# Patient Record
Sex: Male | Born: 1945
Health system: Southern US, Community
[De-identification: ages and names within clinical notes are randomized; demographics above are authoritative.]

## PROBLEM LIST (undated history)

## (undated) DIAGNOSIS — R7309 Other abnormal glucose: Secondary | ICD-10-CM

## (undated) DIAGNOSIS — J189 Pneumonia, unspecified organism: Secondary | ICD-10-CM

## (undated) DIAGNOSIS — Z8719 Personal history of other diseases of the digestive system: Secondary | ICD-10-CM

## (undated) HISTORY — PX: LAPAROSCOPIC SMALL BOWEL RESECTION: SUR793

## (undated) HISTORY — DX: Other abnormal glucose: R73.09

## (undated) HISTORY — DX: Personal history of other diseases of the digestive system: Z87.19

---

## 2004-01-04 ENCOUNTER — Emergency Department (HOSPITAL_COMMUNITY): Admission: EM | Admit: 2004-01-04 | Discharge: 2004-01-04 | Payer: Self-pay | Admitting: *Deleted

## 2004-01-04 ENCOUNTER — Inpatient Hospital Stay (HOSPITAL_COMMUNITY): Admission: EM | Admit: 2004-01-04 | Discharge: 2004-01-10 | Payer: Self-pay | Admitting: Emergency Medicine

## 2006-01-05 ENCOUNTER — Observation Stay (HOSPITAL_COMMUNITY): Admission: EM | Admit: 2006-01-05 | Discharge: 2006-01-07 | Payer: Self-pay | Admitting: Emergency Medicine

## 2006-03-22 ENCOUNTER — Inpatient Hospital Stay (HOSPITAL_COMMUNITY): Admission: EM | Admit: 2006-03-22 | Discharge: 2006-03-26 | Payer: Self-pay | Admitting: Emergency Medicine

## 2006-04-15 HISTORY — PX: COLONOSCOPY: SHX174

## 2006-06-02 ENCOUNTER — Inpatient Hospital Stay (HOSPITAL_COMMUNITY): Admission: EM | Admit: 2006-06-02 | Discharge: 2006-06-07 | Payer: Self-pay | Admitting: Emergency Medicine

## 2006-06-09 ENCOUNTER — Inpatient Hospital Stay (HOSPITAL_COMMUNITY): Admission: EM | Admit: 2006-06-09 | Discharge: 2006-06-13 | Payer: Self-pay | Admitting: Emergency Medicine

## 2006-08-01 ENCOUNTER — Ambulatory Visit: Payer: Self-pay | Admitting: Internal Medicine

## 2006-09-12 ENCOUNTER — Ambulatory Visit: Payer: Self-pay | Admitting: Gastroenterology

## 2006-09-26 ENCOUNTER — Ambulatory Visit: Payer: Self-pay | Admitting: Gastroenterology

## 2006-10-01 ENCOUNTER — Emergency Department (HOSPITAL_COMMUNITY): Admission: EM | Admit: 2006-10-01 | Discharge: 2006-10-01 | Payer: Self-pay | Admitting: Emergency Medicine

## 2006-10-01 ENCOUNTER — Inpatient Hospital Stay (HOSPITAL_COMMUNITY): Admission: EM | Admit: 2006-10-01 | Discharge: 2006-10-05 | Payer: Self-pay | Admitting: Emergency Medicine

## 2007-01-06 ENCOUNTER — Ambulatory Visit: Payer: Self-pay | Admitting: Internal Medicine

## 2007-01-09 ENCOUNTER — Ambulatory Visit: Payer: Self-pay | Admitting: Internal Medicine

## 2007-01-12 ENCOUNTER — Ambulatory Visit: Payer: Self-pay | Admitting: Internal Medicine

## 2007-01-12 LAB — CONVERTED CEMR LAB
AST: 22 units/L (ref 0–37)
BUN: 15 mg/dL (ref 6–23)
Calcium: 9.2 mg/dL (ref 8.4–10.5)
Chloride: 106 meq/L (ref 96–112)
Cholesterol: 126 mg/dL (ref 0–200)
GFR calc Af Amer: 88 mL/min
GFR calc non Af Amer: 73 mL/min
Glucose, Bld: 95 mg/dL (ref 70–99)
Hgb A1c MFr Bld: 6.2 % — ABNORMAL HIGH (ref 4.6–6.0)
LDL Cholesterol: 72 mg/dL (ref 0–99)
PSA: 0.3 ng/mL (ref 0.10–4.00)
Total CHOL/HDL Ratio: 2.7
VLDL: 7 mg/dL (ref 0–40)

## 2007-02-16 ENCOUNTER — Telehealth (INDEPENDENT_AMBULATORY_CARE_PROVIDER_SITE_OTHER): Payer: Self-pay | Admitting: *Deleted

## 2007-02-19 ENCOUNTER — Encounter: Payer: Self-pay | Admitting: Internal Medicine

## 2007-02-19 DIAGNOSIS — R7309 Other abnormal glucose: Secondary | ICD-10-CM | POA: Insufficient documentation

## 2007-02-19 DIAGNOSIS — Z8719 Personal history of other diseases of the digestive system: Secondary | ICD-10-CM | POA: Insufficient documentation

## 2007-02-20 ENCOUNTER — Ambulatory Visit: Payer: Self-pay | Admitting: Internal Medicine

## 2007-02-20 DIAGNOSIS — L0201 Cutaneous abscess of face: Secondary | ICD-10-CM | POA: Insufficient documentation

## 2007-02-20 DIAGNOSIS — L03211 Cellulitis of face: Secondary | ICD-10-CM

## 2007-02-25 ENCOUNTER — Encounter: Payer: Self-pay | Admitting: Internal Medicine

## 2007-03-06 ENCOUNTER — Ambulatory Visit: Payer: Self-pay | Admitting: Internal Medicine

## 2007-04-22 ENCOUNTER — Ambulatory Visit: Payer: Self-pay | Admitting: Internal Medicine

## 2007-04-22 LAB — CONVERTED CEMR LAB
Calcium: 9.2 mg/dL (ref 8.4–10.5)
Cholesterol: 129 mg/dL (ref 0–200)
GFR calc Af Amer: 88 mL/min
GFR calc non Af Amer: 73 mL/min
Glucose, Bld: 95 mg/dL (ref 70–99)
HDL: 49.4 mg/dL (ref >39.0)
Hgb A1c MFr Bld: 5.7 % (ref 4.6–6.0)
LDL Cholesterol: 73 mg/dL (ref 0–99)
Sodium: 140 meq/L (ref 135–145)
Total CHOL/HDL Ratio: 2.6
VLDL: 6 mg/dL (ref 0–40)

## 2007-05-14 ENCOUNTER — Ambulatory Visit: Payer: Self-pay | Admitting: Internal Medicine

## 2007-05-22 ENCOUNTER — Ambulatory Visit: Payer: Self-pay | Admitting: Internal Medicine

## 2007-05-22 DIAGNOSIS — R221 Localized swelling, mass and lump, neck: Secondary | ICD-10-CM

## 2007-05-22 DIAGNOSIS — R220 Localized swelling, mass and lump, head: Secondary | ICD-10-CM | POA: Insufficient documentation

## 2007-05-25 ENCOUNTER — Encounter: Payer: Self-pay | Admitting: Internal Medicine

## 2007-06-05 ENCOUNTER — Ambulatory Visit (HOSPITAL_BASED_OUTPATIENT_CLINIC_OR_DEPARTMENT_OTHER): Admission: RE | Admit: 2007-06-05 | Discharge: 2007-06-05 | Payer: Self-pay | Admitting: Otolaryngology

## 2007-06-05 ENCOUNTER — Encounter: Payer: Self-pay | Admitting: Internal Medicine

## 2007-06-05 ENCOUNTER — Encounter (INDEPENDENT_AMBULATORY_CARE_PROVIDER_SITE_OTHER): Payer: Self-pay | Admitting: Otolaryngology

## 2007-07-21 ENCOUNTER — Ambulatory Visit: Payer: Self-pay | Admitting: Internal Medicine

## 2007-07-21 DIAGNOSIS — M25519 Pain in unspecified shoulder: Secondary | ICD-10-CM | POA: Insufficient documentation

## 2007-07-22 ENCOUNTER — Telehealth: Payer: Self-pay | Admitting: Internal Medicine

## 2007-08-12 ENCOUNTER — Ambulatory Visit: Payer: Self-pay | Admitting: Internal Medicine

## 2007-08-17 ENCOUNTER — Encounter: Payer: Self-pay | Admitting: Internal Medicine

## 2007-08-19 ENCOUNTER — Encounter: Admission: RE | Admit: 2007-08-19 | Discharge: 2007-09-24 | Payer: Self-pay | Admitting: Specialist

## 2008-01-05 ENCOUNTER — Ambulatory Visit: Payer: Self-pay | Admitting: Internal Medicine

## 2008-04-23 ENCOUNTER — Emergency Department (HOSPITAL_COMMUNITY): Admission: EM | Admit: 2008-04-23 | Discharge: 2008-04-24 | Payer: Self-pay | Admitting: Emergency Medicine

## 2008-04-25 ENCOUNTER — Inpatient Hospital Stay (HOSPITAL_COMMUNITY): Admission: EM | Admit: 2008-04-25 | Discharge: 2008-05-04 | Payer: Self-pay | Admitting: Emergency Medicine

## 2008-04-25 ENCOUNTER — Ambulatory Visit: Payer: Self-pay | Admitting: Internal Medicine

## 2008-04-25 ENCOUNTER — Telehealth: Payer: Self-pay | Admitting: Internal Medicine

## 2008-04-26 ENCOUNTER — Encounter: Payer: Self-pay | Admitting: Internal Medicine

## 2008-04-27 ENCOUNTER — Encounter: Payer: Self-pay | Admitting: Internal Medicine

## 2008-04-28 ENCOUNTER — Encounter: Payer: Self-pay | Admitting: Internal Medicine

## 2008-04-29 ENCOUNTER — Encounter: Payer: Self-pay | Admitting: Internal Medicine

## 2008-04-30 ENCOUNTER — Encounter: Payer: Self-pay | Admitting: Internal Medicine

## 2008-05-01 ENCOUNTER — Encounter: Payer: Self-pay | Admitting: Internal Medicine

## 2008-05-02 ENCOUNTER — Encounter: Payer: Self-pay | Admitting: Internal Medicine

## 2008-05-03 ENCOUNTER — Encounter: Payer: Self-pay | Admitting: Internal Medicine

## 2008-05-31 ENCOUNTER — Ambulatory Visit: Payer: Self-pay | Admitting: Internal Medicine

## 2008-05-31 DIAGNOSIS — R142 Eructation: Secondary | ICD-10-CM

## 2008-05-31 DIAGNOSIS — R143 Flatulence: Secondary | ICD-10-CM

## 2008-05-31 DIAGNOSIS — R141 Gas pain: Secondary | ICD-10-CM | POA: Insufficient documentation

## 2009-03-01 ENCOUNTER — Ambulatory Visit: Payer: Self-pay | Admitting: Internal Medicine

## 2009-03-01 ENCOUNTER — Telehealth (INDEPENDENT_AMBULATORY_CARE_PROVIDER_SITE_OTHER): Payer: Self-pay | Admitting: *Deleted

## 2009-03-01 DIAGNOSIS — R1310 Dysphagia, unspecified: Secondary | ICD-10-CM | POA: Insufficient documentation

## 2009-05-18 ENCOUNTER — Ambulatory Visit (HOSPITAL_BASED_OUTPATIENT_CLINIC_OR_DEPARTMENT_OTHER): Admission: RE | Admit: 2009-05-18 | Discharge: 2009-05-18 | Payer: Self-pay | Admitting: Internal Medicine

## 2009-05-18 ENCOUNTER — Ambulatory Visit: Payer: Self-pay | Admitting: Diagnostic Radiology

## 2009-05-18 ENCOUNTER — Telehealth: Payer: Self-pay | Admitting: Internal Medicine

## 2009-05-18 ENCOUNTER — Ambulatory Visit: Payer: Self-pay | Admitting: Internal Medicine

## 2009-05-18 DIAGNOSIS — R002 Palpitations: Secondary | ICD-10-CM | POA: Insufficient documentation

## 2009-05-18 DIAGNOSIS — R059 Cough, unspecified: Secondary | ICD-10-CM | POA: Insufficient documentation

## 2009-05-18 DIAGNOSIS — R05 Cough: Secondary | ICD-10-CM

## 2009-05-19 ENCOUNTER — Encounter: Payer: Self-pay | Admitting: Internal Medicine

## 2010-01-22 ENCOUNTER — Ambulatory Visit: Payer: Self-pay | Admitting: Internal Medicine

## 2010-01-22 DIAGNOSIS — L259 Unspecified contact dermatitis, unspecified cause: Secondary | ICD-10-CM | POA: Insufficient documentation

## 2010-01-22 DIAGNOSIS — B350 Tinea barbae and tinea capitis: Secondary | ICD-10-CM | POA: Insufficient documentation

## 2010-04-20 ENCOUNTER — Ambulatory Visit
Admission: RE | Admit: 2010-04-20 | Discharge: 2010-04-20 | Payer: Self-pay | Source: Home / Self Care | Attending: Internal Medicine | Admitting: Internal Medicine

## 2010-04-20 DIAGNOSIS — M25569 Pain in unspecified knee: Secondary | ICD-10-CM | POA: Insufficient documentation

## 2010-05-13 LAB — CONVERTED CEMR LAB
Basophils Absolute: 0 10*3/uL (ref 0.0–0.1)
Basophils Relative: 0 % (ref 0–1)
Calcium: 9.4 mg/dL (ref 8.4–10.5)
Cholesterol: 134 mg/dL (ref 0–200)
Creatinine, Ser: 1.05 mg/dL (ref 0.40–1.50)
Eosinophils Absolute: 0.1 10*3/uL (ref 0.0–0.7)
HDL: 53 mg/dL (ref >39)
MCHC: 31.5 g/dL (ref 30.0–36.0)
MCV: 82.7 fL (ref 78.0–100.0)
Neutro Abs: 3.5 10*3/uL (ref 1.7–7.7)
Neutrophils Relative %: 59 % (ref 43–77)
Platelets: 222 10*3/uL (ref 150–400)
TSH: 1.489 microintl units/mL (ref 0.350–4.500)
Triglycerides: 66 mg/dL (ref <150)

## 2010-05-17 NOTE — Assessment & Plan Note (Signed)
Summary: Poison Ivy on feet /hea   Vital Signs:  Patient profile:   65 year old male Height:      73.5 inches Weight:      168.50 pounds BMI:     22.01 O2 Sat:      98 % on Room air Temp:     97.9 degrees F oral Pulse rate:   70 / minute Pulse rhythm:   regular Resp:     18 per minute BP sitting:   106 / 64  (right arm) Cuff size:   large  Vitals Entered By: Glendell Docker CMA (January 22, 2010 3:03 PM)  O2 Flow:  Room air CC: Rash on feet Is Patient Diabetic? No Pain Assessment Patient in pain? no        Primary Care Provider:  Dondra Spry DO  CC:  Rash on feet.  History of Present Illness: 65 y/o AA male c/o rash on both feet.    irritation for the past 7 days.  symptoms started after yard work.  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   Lungs:  normal respiratory effort and normal breath sounds.   Heart:  normal rate, regular rhythm, and no gallop.   Skin:  maculopapular rash over top of both feet.  no vesicles.     Impression & Recommendations:  Problem # 1:  CONTACT DERMATITIS (ICD-692.9)  rash over both feet after yard work.  probable contact  dermatitis.  use steroid crm as directed.  Patient advised to call office if symptoms persist or worsen.  use keflex as directed in case of  secondary cellulitis.    Problem # 2:  TINEA BARBAE (ICD-110.0) Assessment: Deteriorated pt with flare.  use metrogel as directed  Complete Medication List: 1)  Flexi Joint Tabs (Glucosamine-chondroit-msm-c-mn) .... Take 1 tablet by mouth once a day 2)  Cephalexin 500 Mg Caps (Cephalexin) .... One by mouth three times a day 3)  Clotrimazole-betamethasone 1-0.05 % Crea (Clotrimazole-betamethasone) .... Apply two times a day 4)  Metrogel 1 % Gel (Metronidazole) .... Apply two times a day  Patient Instructions: 1)  Call our office if your symptoms do not  improve or gets worse. 2)  Please schedule a follow-up appointment in 1 week Prescriptions: METROGEL 1 % GEL (METRONIDAZOLE) apply two times a day  #1 week x 5   Entered and Authorized by:   D. Thomos Lemons DO   Signed by:   D. Thomos Lemons DO on 01/22/2010   Method used:   Electronically to        [facility] (retail)       [address]  [place], [place]  [address]       Ph: [phone]       Fax: [phone]   RxID:   [id number] CLOTRIMAZOLE-BETAMETHASONE 1-0.05 % CREA (CLOTRIMAZOLE-BETAMETHASONE) apply two times a day  #1 jar x 0   Entered and Authorized by:   D. Thomos Lemons DO   Signed by:   D. Thomos Lemons DO on 01/22/2010   Method used:   Electronically to        [facility] (retail)       [address]       [place], [place]  [address]       Ph: [phone]       Fax: [phone]   RxID:   [id number] CEPHALEXIN 500 MG CAPS (CEPHALEXIN) one by mouth three times a day  #30 x 0   Entered and Authorized by:   D. Thomos Lemons DO   Signed by:   D. Thomos Lemons DO on 01/22/2010   Method used:   Electronically to        [facility] (retail)       [address]       [place], [place]   [address]       Ph: [phone]       Fax: [phone]   RxID:   [id number]    Immunization History:  Influenza Immunization History:    Influenza:  historical (01/09/2010)   Current Allergies (reviewed today): No known allergies

## 2010-05-17 NOTE — Letter (Signed)
   [place] [facility] [address] [place], [place]  [address]  [place] Phone: [phone]      May 19, 2009   Richard Middleton [address] [place], [place] [address]  RE:  LAB RESULTS  Dear  [name],  The following is an interpretation of your most recent lab tests.  Please take note of any instructions provided or changes to medications that have resulted from your lab work.  PSA:  normal - no follow-up needed PSA: 0.29  ELECTROLYTES:  Good - no changes needed  KIDNEY FUNCTION TESTS:  Good - no changes needed  LIPID PANEL:  Good - no changes needed Triglyceride: 66   Cholesterol: 134   LDL: 68   HDL: 53   Chol/HDL%:  2.5 Ratio  THYROID STUDIES:  Thyroid studies normal TSH: 1.489      CBC:  Stable - no changes needed       Sincerely Yours,    Dr. Thomos Lemons

## 2010-05-17 NOTE — Assessment & Plan Note (Signed)
Summary: knee pain/mhf   Vital Signs:  Patient profile:   65 year old male Height:      73.5 inches Weight:      172.75 pounds BMI:     22.56 O2 Sat:      98 % Temp:     97.8 degrees F oral Pulse rate:   64 / minute Resp:     16 per minute BP sitting:   110 / 72  (left arm) Cuff size:   large  Vitals Entered By: Glendell Docker CMA (April 20, 2010 3:31 PM) CC: Knee discomfort Is Patient Diabetic? No Pain Assessment Patient in pain? no     Location: knee Type: sharp Comments c/o of left knee discomfort with deep knee bends, for the past 3 months   Primary Care Provider:  Dondra Spry DO  CC:  Knee discomfort.  History of Present Illness: 65 y/o AA male c/o chronic intermittent knee cap pain with deep knee bends no injury or trauma no swellin or redness   Preventive Screening-Counseling & Management  Alcohol-Tobacco     Smoking Status: quit  Allergies (verified): No Known Drug Allergies  Past History:  Past Medical History: History of recurrent small bowel obstruction History of abnormal glucose       Social History: Smoking Status:  quit  Physical Exam  General:  Well-developed,well-nourished,in no acute distress; alert,appropriate and cooperative throughout examination Lungs:  normal respiratory effort and normal breath sounds.   Heart:  normal rate, regular rhythm, and no gallop.   Msk:  left knee:  normal ROM, no joint tenderness, no joint swelling, no joint warmth, and no joint instability.     Impression & Recommendations:  Problem # 1:  PATELLO-FEMORAL SYNDROME (ICD-719.46) Assessment New pain in left knee with deep knee bends.  PF syndrome vs chonromalacia use NSAIDs sparingly reviewed quadricep strengthening exercises. Patient advised to call office if symptoms persist or worsen.  Complete Medication List: 1)  Flexi Joint Tabs (Glucosamine-chondroit-msm-c-mn) .... Take 1 tablet by mouth once a day 2)  Metrogel 1 % Gel (Metronidazole)  .... Apply two times a day  Patient Instructions: 1)  Call our office if your symptoms do not  improve or gets worse.   Orders Added: 1)  Est. Patient Level III [[address]]    Current Allergies (reviewed today): No known allergies

## 2010-05-17 NOTE — Progress Notes (Signed)
  Phone Note Outgoing Call   Summary of Call: call pt - chest x ray is negative for acute findings Initial call taken by: D. Thomos Lemons DO,  May 18, 2009 6:48 PM  Follow-up for Phone Call        ADVISED PATIENT  Richard Middleton  May 22, 2009 10:03 AM

## 2010-05-17 NOTE — Assessment & Plan Note (Signed)
Summary: cpx- jr   Vital Signs:  Patient profile:   65 year old male Weight:      171 pounds BMI:     22.34 O2 Sat:      98 % on Room air Temp:     97.8 degrees F oral Pulse rate:   62 / minute Pulse rhythm:   regular Resp:     18 per minute BP sitting:   110 / 80  (right arm) Cuff size:   regular  Vitals Entered By: Glendell Docker CMA (May 18, 2009 11:10 AM)  O2 Flow:  Room air  Primary Care Provider:  D. Thomos Lemons DO  CC:  CPX.  History of Present Illness: CPX  65 y/o AA male for routine CPX.  no significant interval hx.  He reports chronic mild tickle of throat triggering non productive cough. 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    Physical Exam  General:  alert, well-developed, and well-nourished.   Head:  normocephalic and atraumatic.   Eyes:  pupils equal, pupils round, and pupils reactive to light.   Ears:  R ear normal and L ear normal.   Mouth:  Oral mucosa and oropharynx without lesions or exudates.  Neck:  supple and no masses.   Lungs:  normal respiratory effort and normal breath sounds.   Heart:  normal rate, regular rhythm, no murmur, and no gallop.   Abdomen:   soft, non-tender, normal bowel sounds, and no masses.   Extremities:  No lower extremity edema  Neurologic:  cranial nerves II-XII intact and gait normal.     Impression & Recommendations:  Problem # 1:  PREVENTIVE HEALTH CARE (ICD-V70.0) Reviewed adult health maintenance protocols.  Colonoscopy: Done (01/06/2007) Td Booster: Tdap (08/01/2006)   Flu Vax: Fluvax Non-MCR (02/01/2009)   Chol: 129 (04/22/2007)   HDL: 49.4 (04/22/2007)   LDL: 73 (04/22/2007)   TG: 31 (04/22/2007) HgbA1C: 5.7 (04/22/2007)   PSA: 0.21 (04/22/2007)  Problem # 2:  COUGH (ICD-786.2) Pt with mild intermittent cough.  I suspect post nasal gtt or gerd.  check cxr.  use nasal saline irrigation.   Patient advised to call office if symptoms persist or worsen.  Other Orders: EKG w/ Interpretation ([address]) T-Basic Metabolic Panel (04540-98119) T-CBC w/Diff [phone]) T-Lipid Profile [phone]) T-TSH [id number]) T-PSA ([address]010-27253) T-2 View CXR, Same Day (71020.5TC)  Patient Instructions: 1)  Please schedule a follow-up appointment in 1 year. 2)  Please schedule a follow-up appointment as needed.      Current Allergies (reviewed today): No known allergies

## 2010-06-19 ENCOUNTER — Other Ambulatory Visit: Payer: Self-pay

## 2010-06-22 ENCOUNTER — Other Ambulatory Visit: Payer: Self-pay | Admitting: Internal Medicine

## 2010-06-22 ENCOUNTER — Encounter (INDEPENDENT_AMBULATORY_CARE_PROVIDER_SITE_OTHER): Payer: Self-pay | Admitting: *Deleted

## 2010-06-22 ENCOUNTER — Other Ambulatory Visit: Payer: Managed Care, Other (non HMO)

## 2010-06-22 DIAGNOSIS — Z0000 Encounter for general adult medical examination without abnormal findings: Secondary | ICD-10-CM

## 2010-06-22 LAB — HEPATIC FUNCTION PANEL
Alkaline Phosphatase: 77 U/L (ref 39–117)
Bilirubin, Direct: 0.1 mg/dL (ref 0.0–0.3)

## 2010-06-22 LAB — BASIC METABOLIC PANEL
BUN: 15 mg/dL (ref 6–23)
CO2: 31 mEq/L (ref 19–32)
Chloride: 103 mEq/L (ref 96–112)
Glucose, Bld: 91 mg/dL (ref 70–99)
Potassium: 4.4 mEq/L (ref 3.5–5.1)

## 2010-06-22 LAB — LIPID PANEL
Cholesterol: 139 mg/dL (ref 0–200)
LDL Cholesterol: 81 mg/dL (ref 0–99)
VLDL: 6.2 mg/dL (ref 0.0–40.0)

## 2010-06-22 LAB — CBC WITH DIFFERENTIAL/PLATELET
Basophils Absolute: 0 10*3/uL (ref 0.0–0.1)
Basophils Relative: 0.2 % (ref 0.0–3.0)
Eosinophils Absolute: 0.1 10*3/uL (ref 0.0–0.7)
Lymphocytes Relative: 30.9 % (ref 12.0–46.0)
MCHC: 33.6 g/dL (ref 30.0–36.0)
Neutrophils Relative %: 62.1 % (ref 43.0–77.0)
RBC: 4.92 Mil/uL (ref 4.22–5.81)

## 2010-06-22 LAB — PSA: PSA: 0.36 ng/mL (ref 0.10–4.00)

## 2010-06-22 LAB — URINALYSIS
Specific Gravity, Urine: 1.02 (ref 1.000–1.030)
Total Protein, Urine: NEGATIVE
Urine Glucose: NEGATIVE

## 2010-06-29 ENCOUNTER — Encounter (INDEPENDENT_AMBULATORY_CARE_PROVIDER_SITE_OTHER): Payer: Managed Care, Other (non HMO) | Admitting: Internal Medicine

## 2010-06-29 ENCOUNTER — Encounter: Payer: Self-pay | Admitting: Internal Medicine

## 2010-06-29 DIAGNOSIS — D179 Benign lipomatous neoplasm, unspecified: Secondary | ICD-10-CM | POA: Insufficient documentation

## 2010-06-29 DIAGNOSIS — Z0000 Encounter for general adult medical examination without abnormal findings: Secondary | ICD-10-CM

## 2010-07-02 ENCOUNTER — Encounter (INDEPENDENT_AMBULATORY_CARE_PROVIDER_SITE_OTHER): Payer: Self-pay | Admitting: *Deleted

## 2010-07-12 NOTE — Letter (Signed)
Summary: Primary Care Consult Scheduled Letter  [place] [facility]  [address]   [place], [place] [address]   Phone: [phone]  Fax: [phone]      07/02/2010 MRN: [id number]  TAWAN DEGROOTE [address] [place], [place]  [address]  [place]    Dear Mr. KNOTH,      We have scheduled an appointment for you.  At the recommendation of Dr.YOO, we have scheduled you a consult with [facility], DR Corliss Skains on [facility] 29,2012 at 10:20AM.  Their address is_[address],[place]  . The office phone number is [phone].  If this appointment day and time is not convenient for you, please feel free to call the office of the doctor you are being referred to at the number listed above and reschedule the appointment.     It is important for you to keep your scheduled appointments. We are here to make sure you are given good patient care.   Thank you, Darral Dash Patient Care Coordinator [place] [facility]

## 2010-07-17 NOTE — Assessment & Plan Note (Signed)
Summary: CPE/MHF   Vital Signs:  Patient profile:   65 year old male Height:      73.5 inches Weight:      172.50 pounds BMI:     22.53 O2 Sat:      99 % on Room air Temp:     97.5 degrees F oral Pulse rate:   60 / minute Resp:     18 per minute BP sitting:   100 / 66  (left arm)  Vitals Entered By: Glendell Docker CMA (June 29, 2010 11:07 AM)  O2 Flow:  Room air CC: CPX Is Patient Diabetic? No Pain Assessment Patient in pain? no        Primary Care Provider:  Dondra Spry DO  CC:  CPX.  History of Present Illness: 65 y/o AA male for routine cpx   right axilla fatty tumor getting bigger  Preventive Screening-Counseling & Management  Alcohol-Tobacco     Alcohol drinks/day: 0     Smoking Status: quit  Caffeine-Diet-Exercise     Caffeine use/day: 1-2  beverages per week     Does Patient Exercise: yes     Times/week: 5  Allergies (verified): No Known Drug Allergies  Past History:  Past Medical History: History of recurrent small bowel obstruction History of abnormal glucose        Past Surgical History: small bowel obstrcution x2, status post resection 2005      Social History: Married with 2 children   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   Head:  normocephalic and atraumatic.   Eyes:  pupils equal, pupils round, and pupils reactive to light.   Ears:  R ear normal and L ear normal.   Mouth:  pharynx pink and moist.   Neck:  supple and no masses.  no carotid bruits.   Lungs:  normal respiratory effort and normal breath sounds.   Heart:  normal rate, regular rhythm, and no gallop.   Abdomen:  soft, non-tender, normal bowel sounds, no masses,  no hepatomegaly, and no splenomegaly.   Extremities:  No lower extremity edema  Neurologic:  cranial nerves II-XII intact and gait normal.   Skin:  4 cm mass right axilla Psych:  normally interactive, good eye contact, not anxious appearing, and not depressed appearing.     Impression & Recommendations:  Problem # 1:  PREVENTIVE HEALTH CARE (ICD-V70.0) Reviewed adult health maintenance protocols.  Orders: EKG w/ Interpretation ([address])  Colonoscopy: Done (01/06/2007) Td Booster: Tdap (08/01/2006)   Flu Vax: Historical (01/09/2010)   Chol: 139 (06/22/2010)   HDL: 51.80 (06/22/2010)   LDL: 81 (06/22/2010)   TG: 31.0 (06/22/2010) TSH: 1.25 (06/22/2010)   HgbA1C: 5.7 (04/22/2007)   PSA: 0.36 (06/22/2010)  Problem # 2:  LIPOMA (ICD-214.9)  pt with probable lipoma right axilla - getting larger  Orders: Surgical Referral (Surgery)  Complete Medication List: 1)  Flexi Joint Tabs (Glucosamine-chondroit-msm-c-mn) .... Take 1 tablet by mouth once a day  Patient Instructions: 1)  Please schedule a follow-up appointment in 1 year.   Orders Added: 1)  EKG w/ Interpretation [93[address]] 2)  Surgical Referral [Surgery] 3)  Est. Patient 40-64 years [[address]]    Current Allergies (reviewed today):  No known allergies

## 2010-07-30 LAB — DIFFERENTIAL
Eosinophils Absolute: 0 10*3/uL (ref 0.0–0.7)
Eosinophils Relative: 0 % (ref 0–5)
Lymphocytes Relative: 17 % (ref 12–46)
Lymphocytes Relative: 9 % — ABNORMAL LOW (ref 12–46)
Lymphs Abs: 0.8 10*3/uL (ref 0.7–4.0)
Lymphs Abs: 0.9 10*3/uL (ref 0.7–4.0)
Monocytes Absolute: 0.2 10*3/uL (ref 0.1–1.0)
Monocytes Relative: 5 % (ref 3–12)
Neutrophils Relative %: 78 % — ABNORMAL HIGH (ref 43–77)

## 2010-07-30 LAB — CBC
HCT: 39.8 % (ref 39.0–52.0)
HCT: 41.4 % (ref 39.0–52.0)
HCT: 45.3 % (ref 39.0–52.0)
Hemoglobin: 13.9 g/dL (ref 13.0–17.0)
Hemoglobin: 14.9 g/dL (ref 13.0–17.0)
MCHC: 33.5 g/dL (ref 30.0–36.0)
MCV: 82.3 fL (ref 78.0–100.0)
MCV: 82.4 fL (ref 78.0–100.0)
MCV: 82.7 fL (ref 78.0–100.0)
MCV: 83 fL (ref 78.0–100.0)
Platelets: 163 10*3/uL (ref 150–400)
Platelets: 163 10*3/uL (ref 150–400)
Platelets: 175 10*3/uL (ref 150–400)
RBC: 4.79 MIL/uL (ref 4.22–5.81)
RBC: 5.39 MIL/uL (ref 4.22–5.81)
RDW: 13.1 % (ref 11.5–15.5)
RDW: 13.6 % (ref 11.5–15.5)
WBC: 5.7 10*3/uL (ref 4.0–10.5)
WBC: 7.2 10*3/uL (ref 4.0–10.5)
WBC: 7.8 10*3/uL (ref 4.0–10.5)
WBC: 8.8 10*3/uL (ref 4.0–10.5)

## 2010-07-30 LAB — HEPATIC FUNCTION PANEL
ALT: 33 U/L (ref 0–53)
Alkaline Phosphatase: 87 U/L (ref 39–117)
Bilirubin, Direct: 0.2 mg/dL (ref 0.0–0.3)
Indirect Bilirubin: 0.9 mg/dL (ref 0.3–0.9)
Total Protein: 8.2 g/dL (ref 6.0–8.3)

## 2010-07-30 LAB — POCT I-STAT, CHEM 8
BUN: 16 mg/dL (ref 6–23)
BUN: 17 mg/dL (ref 6–23)
Calcium, Ion: 1.16 mmol/L (ref 1.12–1.32)
Chloride: 101 mEq/L (ref 96–112)
Creatinine, Ser: 1.2 mg/dL (ref 0.4–1.5)
Glucose, Bld: 117 mg/dL — ABNORMAL HIGH (ref 70–99)
Glucose, Bld: 201 mg/dL — ABNORMAL HIGH (ref 70–99)
Hemoglobin: 16.3 g/dL (ref 13.0–17.0)
Potassium: 3.7 mEq/L (ref 3.5–5.1)
Sodium: 141 mEq/L (ref 135–145)
TCO2: 28 mmol/L (ref 0–100)

## 2010-07-30 LAB — BASIC METABOLIC PANEL
BUN: 15 mg/dL (ref 6–23)
BUN: 17 mg/dL (ref 6–23)
BUN: 19 mg/dL (ref 6–23)
CO2: 26 mEq/L (ref 19–32)
Chloride: 104 mEq/L (ref 96–112)
Chloride: 107 mEq/L (ref 96–112)
Creatinine, Ser: 0.91 mg/dL (ref 0.4–1.5)
Creatinine, Ser: 0.95 mg/dL (ref 0.4–1.5)
GFR calc Af Amer: >60 mL/min (ref >60)
GFR calc non Af Amer: >60 mL/min (ref >60)
GFR calc non Af Amer: >60 mL/min (ref >60)
Glucose, Bld: 106 mg/dL — ABNORMAL HIGH (ref 70–99)
Glucose, Bld: 112 mg/dL — ABNORMAL HIGH (ref 70–99)
Potassium: 3.9 mEq/L (ref 3.5–5.1)
Potassium: 4 mEq/L (ref 3.5–5.1)
Sodium: 140 mEq/L (ref 135–145)

## 2010-08-01 ENCOUNTER — Other Ambulatory Visit: Payer: Self-pay | Admitting: Surgery

## 2010-08-01 ENCOUNTER — Encounter (HOSPITAL_COMMUNITY): Payer: Managed Care, Other (non HMO) | Attending: Surgery

## 2010-08-01 DIAGNOSIS — D1779 Benign lipomatous neoplasm of other sites: Secondary | ICD-10-CM | POA: Insufficient documentation

## 2010-08-01 DIAGNOSIS — Z01812 Encounter for preprocedural laboratory examination: Secondary | ICD-10-CM | POA: Insufficient documentation

## 2010-08-01 LAB — CBC
HCT: 40.7 % (ref 39.0–52.0)
Hemoglobin: 13 g/dL (ref 13.0–17.0)
MCHC: 31.9 g/dL (ref 30.0–36.0)
MCV: 83.7 fL (ref 78.0–100.0)
Platelets: 192 10*3/uL (ref 150–400)
RDW: 13 % (ref 11.5–15.5)
WBC: 5.4 10*3/uL (ref 4.0–10.5)

## 2010-08-01 LAB — SURGICAL PCR SCREEN: Staphylococcus aureus: NEGATIVE

## 2010-08-10 ENCOUNTER — Ambulatory Visit (HOSPITAL_COMMUNITY)
Admission: RE | Admit: 2010-08-10 | Discharge: 2010-08-10 | Disposition: A | Discharge status: Home / Self Care | Payer: Managed Care, Other (non HMO) | Source: Ambulatory Visit | Attending: Surgery | Admitting: Surgery

## 2010-08-10 ENCOUNTER — Other Ambulatory Visit: Payer: Self-pay | Admitting: Surgery

## 2010-08-10 DIAGNOSIS — D1739 Benign lipomatous neoplasm of skin and subcutaneous tissue of other sites: Secondary | ICD-10-CM | POA: Insufficient documentation

## 2010-08-12 NOTE — Op Note (Signed)
  NAME:  Richard Middleton, SMETHURST NO.:  [id number]  MEDICAL RECORD NO.:  [id number]           PATIENT TYPE:  O  LOCATION:  DAYL                         FACILITY:  [facility]  PHYSICIAN:  Wilmon Arms. Corliss Skains, M.D. DATE OF BIRTH:  [birth year]  DATE OF PROCEDURE:  08/10/2010 DATE OF DISCHARGE:                              OPERATIVE REPORT   PREOPERATIVE DIAGNOSIS:  Lipoma, right axilla, 4 cm subcutaneous.  POSTOPERATIVE DIAGNOSIS:  Lipoma, right axilla, 4 cm subcutaneous.  PROCEDURE PERFORMED:  Excision of subcutaneous lipoma, 4 cm subcutaneous.  SURGEON:  Wilmon Arms. Merla Sawka, M.D.  ANESTHESIA:  General.  INDICATIONS:  This is a 65 year old male who presents with a long history of a slowly enlarging mass in his right axilla.  This caused him some mild discomfort.  He presents now for excision.  DESCRIPTION OF PROCEDURE:  The patient was brought to the operating room, placed in the supine position on operating room table.  After anadequate level of general anesthesia was obtained, the patient's right axilla was shaved, prepped with ChloraPrep and draped in sterile fashion.  Time-out was taken to assure proper patient and proper procedure.  We infiltrated the area over the lipoma with 0.25% Marcaine with epinephrine.  A transverse incision was made.  Dissection was carried down through the dermis to the surface of the lipoma.  We dissected completely around the lipoma.  We removed this in its entirely.  The lipoma was densely adherent to the edge of the pectoralis muscle.  The specimen was sent for pathologic examination.  The wound was inspected for hemostasis.  We closed the wound with deep layer of 3- 0 Vicryl and a subcuticular layer of 4-0 Monocryl.  Steri-Strips and clean dressing were applied.  The patient was then extubated and brought to recovery room in stable condition.  All sponge, instrument, and needle counts were correct.     Wilmon Arms. Corliss Skains,  M.D.     MKT/MEDQ  D:  08/10/2010  T:  08/10/2010  Job:  [id number]  Electronically Signed by Manus Rudd M.D. on 08/12/2010 10:09:16 PM

## 2010-08-28 NOTE — H&P (Signed)
NAME:  Richard Middleton, Richard Middleton NO.:  [id number]   MEDICAL RECORD NO.:  [id number]          PATIENT TYPE:  INP   LOCATION:  1333                         FACILITY:  [facility]   PHYSICIAN:  Angelia Mould. Derrell Lolling, M.D.DATE OF BIRTH:  1945/05/29   DATE OF ADMISSION:  10/01/2006  DATE OF DISCHARGE:                              HISTORY & PHYSICAL   CHIEF COMPLAINT:  Abdominal pain and vomiting.   HISTORY OF PRESENT ILLNESS:  This is a 65 year old, black man in good  health.  He has a history of a laparotomy for small bowel obstruction  due to adhesions in 2005.  At that time, it was thought to either be a  congenital or inflammatory band.  Dr. Johna Sheriff performed the surgery,  and the patient recovered uneventfully.   It looks like he was hospitalized in December 2007 with some small bowel  obstruction which resolved after multiple workup showing a long segment  of dilated mid small bowel with stacked-coin appearance but no  obstructing lesion.  He did not require surgery at that time.  He was  also hospitalized for 3 or 4 days in February 2008 by Dr. Carolynne Edouard.  This  resolved with bowel rest, and he became well again.   This episode began yesterday afternoon, September 30, 2006, at 5:30 p.m.  He  had eaten a large meal and had eaten rather rapidly.  He developed right  lower quadrant and lower abdominal crampy pain and came to the Emergency  Room.  He had vomited 2 or 3 times but had vomited only food.  Denied  vomiting any bilious or feces-like material.  He had bowel movements  yesterday.  In fact, he had a semiformed bowel movement at midnight last  night.  He came to Emergency Room yesterday afternoon and was given  citrate of magnesium and sent home early this morning.  After taking the  citrate of magnesium, he had some more crampy pain and vomited and came  back to the Emergency Room about noon today.  The Emergency Room doctor  got a CT scan which suggested a partial small bowel  obstruction with a  transition zone in the right lower quadrant.  His lab work is  unremarkable.   He has currently been pain free and without nausea or vomiting for about  the past 4 or 5 hours and feels fine.  He feels normal.  Denies feeling  distended or full.  I was asked to see him because of his CT scan.   PAST HISTORY:  Laparotomy for small bowel obstruction and lysis of  adhesions in 2005.  Hospitalized in 2007 and again in February 2008 for  a partial small bowel obstruction which resolved without surgery.  He  has no other medical or surgical problems and has been healthy.   CURRENT MEDICATIONS:  Multivitamins.   ALLERGIES:  NONE KNOWN.   SOCIAL HISTORY:  Married, has 2 children.  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  Denies alcohol or  tobacco.   FAMILY HISTORY:  Mother and father  are living and well.  He has a  grandparent with diabetes.   REVIEW OF SYSTEMS:  15 system review of systems is performed and is  noncontributory except as described above.   PHYSICAL EXAMINATION:  GENERAL:  Pleasant, talkative, middle-aged, black  man who is thin and in no distress.  VITAL SIGNS:  Temperature 97.2, pulse 56, respirations 20, blood  pressure 125/82.  EYES:  Sclerae are clear.  Extraocular movements intact.  EARS, NOSE, MOUTH, AND THROAT:  Nose, lips, tongue, and oropharynx  without gross lesions.  NECK:  Supple, nontender.  No mass.  No jugular venous distension.  LUNGS:  Clear to auscultation.  No chest wall tenderness.  HEART:  Regular rate and rhythm.  No murmur.  Radial, femoral, and  posterior tibial pulses are palpable.  No peripheral edema.  ABDOMEN:  Soft, nontender, not distended.  Hypoactive bowel sounds.  Liver and spleen not enlarged.  There is a lower midline scar.  No  hernias are noted in the abdomen or in the inguinal areas.  There is a  little bit of increased tympany to percuss in the upper abdomen.  Absolutely no  tenderness or guarding whatsoever.  EXTREMITIES:  Moves all 4 extremities well without pain or deformity.  NEUROLOGIC:  No gross motor or sensory deficits.   ADMISSION DATA:  Hemoglobin 13.1, white blood cell count 6400.  Complete  metabolic panel normal except for a glucose of 159, lipase of 19.  CT  scan described above.   IMPRESSION:  Recurrent partial small bowel obstruction presumably due to  adhesions.  There is no evidence of compromised bowel at this time.   PLAN:  The patient will be admitted and placed at bowel rest.  We will  hold off on placing an NG tube unless he develops vomiting.  He was  advised that this may resolve spontaneously, but if it does not, then he  may require laparotomy.  All his questions are answered.      Angelia Mould. Derrell Lolling, M.D.  Electronically Signed     HMI/MEDQ  D:  10/01/2006  T:  10/02/2006  Job:  [id number]   cc:   Barbette Hair. [place], DO  [address] [place], [place] [address]

## 2010-08-28 NOTE — Discharge Summary (Signed)
NAME:  Richard Middleton, Richard Middleton NO.:  [id number]   MEDICAL RECORD NO.:  [id number]          PATIENT TYPE:  INP   LOCATION:  1325                         [facility]ACILITY[facility]  [facility]   PHYSICIAN:  Ollen Gross. Vernell Morgans, M.D. DATE OF BIRTH:  1945-07-08   DATE OF ADMISSION:  06/09/2006  DATE OF DISCHARGE:  06/13/2006                               DISCHARGE SUMMARY   HOSPITAL COURSE:  This is a 65 year old gentleman who presented with  nausea and vomiting.  He had abdominal x-rays that were consistent with  a small bowel obstruction although he was having some small bowel  movements.  He was placed on bowel rest and his abdominal films  gradually improved.  As they improved his diet was slowly advanced and  he tolerated this reasonably well and on 02/29 he was tolerating a soft  diet.  His abdomen was not distended.  His films looked good.  He was  ready for discharge home.   MEDICATIONS:  He was to resume his home medications.   DIET:  Soft and liquid diet.   FINAL DIAGNOSIS:  Partial small-bowel obstruction.   FOLLOW UP:  Follow-up with Dr. Carolynne Edouard in a couple of weeks.   CONDITION:  Condition is stable and he is discharged home.      Ollen Gross. Vernell Morgans, M.D.  Electronically Signed     PST/MEDQ  D:  09/11/2006  T:  09/11/2006  Job:  [id number]

## 2010-08-28 NOTE — Op Note (Signed)
NAME:  Richard Middleton, Richard Middleton                  ACCOUNT NO.:  [id number]   MEDICAL RECORD NO.:  [id number]          PATIENT TYPE:  AMB   LOCATION:  DSC                          FACILITY:  [facility]   PHYSICIAN:  Christopher E. Ezzard Standing, M.D.DATE OF BIRTH:  1946-04-01   DATE OF PROCEDURE:  06/05/2007  DATE OF DISCHARGE:                               OPERATIVE REPORT   PREOPERATIVE DIAGNOSIS:  Submental mass, questionable lymph node versus  cyst versus lipoma.   POSTOPERATIVE DIAGNOSIS:  Submental mass, questionable lymph node versus  cyst versus lipoma.   OPERATIONS:  Excision of submental mass.   SURGEON:  Narda Bonds, M.D.   ANESTHESIA:  Local 1% Xylocaine with 100,000 epinephrine.   COMPLICATIONS:  None.   CLINICAL NOTE:  Mr. Dini is a 65 year old gentleman who has had a nodule  in the submental area now for a couple years.  He states this really has  not changed in the last year or two.  It is not tender.  On exam, he has  an approximate 2.5-cm nodule in the submental area consistent with  probable submental lymph node versus a cyst versus lipoma.  He was taken  to operating room at this time for excision of a submental mass.   DESCRIPTION OF PROCEDURE:  The patient was brought to the operating  room.  The submental area was marked out and prepped with Betadine  solution.  The area was then injected with Xylocaine with epinephrine  for local anesthetic.  Incision was made directly over the mass.  The  platysma muscle was divided.  Mass was located deep to the platysmas  muscle consistent with probable lymph node.  The mass was removed.  A 4-  0 chromic suture ligature was used to control bleeding, and cautery was  used control bleeding.  Mass was sent in saline fresh to pathology.  The  defect was closed with 4-0 chromic sutures subcutaneously and 5-0 nylon  to reapproximate skin edges.  Dressing was applied.  The patient  tolerated this well.   DISPOSITION:  The patient was discharged  home later this morning on  Tylenol and Vicodin p.r.n. pain.  I will have him follow up in my office  in 1 week to review pathology and have sutures removed.           ______________________________  Kristine Garbe Ezzard Standing, M.D.     CEN/MEDQ  D:  06/05/2007  T:  06/06/2007  Job:  [id number]   cc:   Barbette Hair. Artist Pais, DO

## 2010-08-28 NOTE — Discharge Summary (Signed)
NAME:  Richard Middleton, Richard Middleton                  ACCOUNT NO.:  [id number]   MEDICAL RECORD NO.:  [id number]          PATIENT TYPE:  INP   LOCATION:  1343                         FACILITY:  [facility]   PHYSICIAN:  Valerie A. Felicity Coyer, MDDATE OF BIRTH:  June 24, 1945   DATE OF ADMISSION:  04/25/2008  DATE OF DISCHARGE:  05/04/2008                               DISCHARGE SUMMARY   DISCHARGE DIAGNOSES:  1. Small bowel obstruction, history of recurrence without clear      etiology, resolved with medical management.  2. History of exploratory laparotomy with question of resection for      small bowel obstruction in 2005.  3. History of mild osteoarthritis.   DISCHARGE MEDICATIONS:  None, as he was taking none prior to admission.   CONSULTATIONS:  [facility].   DIAGNOSTICS:  CT scan of the abdomen/pelvis with contrast April 28, 2008:  Recurrent mid to distal small bowel obstruction without discrete  etiology identified.   PROCEDURE:  None.   HOSPITAL COURSE:  1. Small bowel obstruction.  The patient is a very pleasant, otherwise      healthy 65 year old gentleman with history of small bowel      obstruction recurrent since 2005 with multiple previous      hospitalizations for same.  He had come to the emergency room 2      days prior to admission due to abdominal pain with nausea and      vomiting, and was given a prescription for Vicodin and allowed      discharge home.  However following discharge home, he did not do      well with continued pain, nausea and vomiting, and returned to the      emergency room for further evaluation.  On that day because of      persistent bowel obstructive symptoms with x-ray changes consistent      with bowel obstruction, he was referred for admission to the      medical team.  He was treated with NG suction, IV fluids,      symptomatic medical treatment and bowel rest with serial x-rays.      He was seen in consultation on the day of admission by Dr.  Wenda Low due to history of recurrence with previous exploratory      laparotomy.  Decision was made to follow him clinically which was      done for the first several days as he was making little progress      either clinically or on serial KUBs.  A CT abdomen and pelvis again      was performed on April 28, 2008 as described above, showing no      clear adhesion or mass contributing to small bowel obstructive      symptoms.  Around this time, the patient then began having more      flatus and bowel movements.  So the NG tube was discontinued and he      was allowed on clears for the first 24 hours.  At that  point, he      had a mild setback with increased pain, nausea and vomiting, though      with return to bowel rest and continued IV fluids, this gradually      resolved.  He then began having bowel movements, flatus and      resolution of his pain.  His diet was advanced without      complication.  On the day of discharge, he is tolerating a low-      residue diet without any symptoms of pain, nausea, vomiting and had      a solid bowel movement yesterday without complicating features.  He      is felt at this time medically stable for discharge home having      resolved another episode of his recurrent obstruction without need      for repeat exploratory laparotomy.   He is welcome to follow up with surgery on an as needed basis and with  his primary care physician, Dr. Thomos Lemons, call as needed in the next  few weeks.      Valerie A. Felicity Coyer, MD  Electronically Signed     VAL/MEDQ  D:  05/04/2008  T:  05/04/2008  Job:  [id number]

## 2010-08-31 NOTE — H&P (Signed)
NAME:  Richard Middleton, Richard Middleton NO.:  [id number]   MEDICAL RECORD NO.:  [id number]          PATIENT TYPE:  EMS   LOCATION:  ED                           FACILITY:  [facility]   PHYSICIAN:  Ollen Gross. Vernell Morgans, M.D. DATE OF BIRTH:  1945-11-27   DATE OF ADMISSION:  06/09/2006  DATE OF DISCHARGE:                              HISTORY & PHYSICAL   HISTORY OF PRESENT ILLNESS:  Mr. [name] is a 65 year old black male who  presented the emergency department after experiencing some nausea and  vomiting around midnight.  He was in the hospital about a week ago for a  small bowel obstruction that was managed with bowel rest and IV  hydration, and he had apparent resolution of this but was concerned last  night since his episode of nausea and vomiting.  It sounds as though his  nausea has resolved.  He has not had any more vomiting, and has actually  had a couple of small looser bowel movements.  He is not complaining of  any pain right now.   PAST MEDICAL HISTORY:  Significant for previous bowel obstruction.   PAST SURGICAL HISTORY:  Exploratory laparotomy in 2005 by Dr. Johna Sheriff  for this with lysis of adhesions.   MEDICATIONS:  None.   ALLERGIES:  None.   SOCIAL HISTORY:  Denies smoking or tobacco products.   FAMILY HISTORY:  Noncontributory.   PHYSICAL EXAMINATION:  VITAL SIGNS:  Temperature 97.9, blood pressure  127/76, pulse 65.  GENERAL:  He is a well-developed, well-nourished black male in no acute  distress.  SKIN:  Warm and dry with no jaundice.  HEENT:  Eyes:  Extraocular muscles intact.  Pupils equal, round and  reactive to light.  Sclerae nonicteric.  LUNGS: Clear bilaterally with no use of accessory respiratory muscles.  HEART:  Regular rate and rhythm with impulse in left chest.  ABDOMEN:  Soft and nontender and nondistended.  He has normal bowel  sounds.  No palpable mass or hepatosplenomegaly.  No distension.  EXTREMITIES:  No cyanosis, clubbing or edema with good  strength in his  arms and legs.  PSYCHOLOGICAL:  Alert and oriented x3 with no anxiety or depression.   LABORATORY DATA:  On review of his lab work, it was significant for  normal white count of 6200.  His electrolytes were okay.  On reviewing  his abdominal x-rays, he was noted to have a couple of air fluid levels  consistent with early partial SBO.  potentially.   ASSESSMENT/PLAN:  This is a 65 year old black male who is had a history  of intestinal blockages who presents with some nausea and vomiting after  midnight.  The nausea and vomiting seems to have resolved and his  abdomen  is very benign on exam.  His x-rays do show a couple of air fluid levels  consistent with possibly an early partial small bowel obstruction.  We  will therefore plan to admit him for bowel rest and IV hydration and  monitor him closely.  Will repeat his labs and x-rays in the morning and  go  from there.      Ollen Gross. Vernell Morgans, M.D.  Electronically Signed     PST/MEDQ  D:  06/09/2006  T:  06/09/2006  Job:  [id number]

## 2010-08-31 NOTE — Consult Note (Signed)
NAME:  Richard Middleton, Richard Middleton NO.:  [id number]   MEDICAL RECORD NO.:  [id number]          PATIENT TYPE:  INP   LOCATION:  0101                         FACILITY:  [facility]   PHYSICIAN:  Ardeth Sportsman, MD     DATE OF BIRTH:  07/02/1945   DATE OF CONSULTATION:  06/02/2006  DATE OF DISCHARGE:                                 CONSULTATION   REQUESTING PHYSICIAN:  Dr. Lavera Guise with [facility]   SURGEON:  Karie Soda, MD.   TYPE OF CONSULT:  General surgical.   REASON FOR CONSULT:  Possible small bowel obstruction versus  gastroenteritis.   HISTORY OF PRESENT ILLNESS:  [name] is a 65 year old gentleman who is  otherwise pretty healthy, who apparently developed a bowel obstruction  about 2-3 years ago and underwent urgent surgery with lysis of  adhesions.  Unfortunately, I have no records of this.  He thinks it was  done at [facility].  He thinks it was done by Dr. Zachery Dakins.  He ultimately improved and went home.  He did bounce back to the  hospital within the past year which with what sounds like a partial  small bowel obstruction where he had IV fluids and NG tube and  ultimately opened back up according to surgery.  He has been followed by  Dr. Zachery Dakins regularly and has not had any problems since the last  admission.   Last night after eating some vegetables, he noticed some abdominal pain  and nausea and threw up about 10 times and also had 4 loose bowel  movements, almost diarrhea-like with flatus.  No hematochezia or melena.  He has never had anything like this before.  No other sick contacts.  Denies any fevers, chills, sweats.  Because he had worsening pain, he  came to the emergency room, was evaluated.  Concern was maybe for  partial small bowel obstruction.  He has been admitted by medicine, but  I have been asked to evaluate him.   PAST MEDICAL HISTORY:  1. Small bowel obstruction in the past.  2. Otherwise, he denies any really significant  history.   PAST SURGICAL HISTORY:  Exploratory laparotomy and lysis of adhesions in  2005 by Dr Jaclynn Guarneri   MEDICATIONS:  None.   ALLERGIES:  None.   SOCIAL HISTORY:  He maybe has a 20-pack-year history of tobacco, but he  quit several years ago.  No alcohol, or other drug use.   FAMILY HISTORY:  Noncontributory for any major problems, negative for  any significant gastrointestinal disorders and no problems that he can  recall.   VITAL SIGNS:  Height 6 feet 1 inch, weight 79 kg, temperature 97.6,  respirations 16, heart rate 67, blood pressure 120/62, 100% saturations  on room air.   REVIEW OF SYSTEMS:  Constitutional, psych, neuro, ophthalmology, ENT,  cardiac, respiratory, GU are otherwise negative.  Musculoskeletal,  allergic, dermatologic are otherwise negative.   PHYSICAL EXAMINATION:  GENERAL:  He is a well-developed well-nourished  male in no acute distress.  PSYCH:  He is pleasant, interactive with no  evidence of dementia,  delirium, psychosis, or paranoia.  NEUROLOGIC:  Cranial nerves 2-12 are intact.  Hand grip is 5/5 equal and  symmetrical.  No resting or adjacent tremors.  EYES:  Pupils equal, round, and reactive to light.  Extraocular  movements intact.  He is nonicteric.  Sclerae is nonicteric or injected.  ENT:  Normocephalic with no facial asymmetry.  Mucous membranes are dry.  Oropharynx clear.  NECK:  Supple without any masses.  Trachea is midline.  Thyroid appears  to be normal.  HEART:  Regular rate and rhythm with no murmurs, gallops, or rubs.  No  obvious carotid bruits.  CHEST:  Clear to auscultation bilaterally.  No wheezes, rales, or  rhonchi.  No pain to rib or external compression.  ABDOMEN:  Soft, nontender, nondistended.  He has a low midline incision  with maybe a slight periumbilical hernia but is extremely subtle with no  evidence of incarceration or pain.  But no known incisional hernia.  GENITAL:  Normal external male genitalia with  testes extended  bilaterally.  No inguinal hernias.  RECTAL:  Deferred.  EXTREMITIES:  No cyanosis, clubbing, or edema.  MUSCULOSKELETAL:  Full range of motion of shoulders, elbows, hips,  knees, and ankle.  SKIN:  No obvious petechiae or purpura.  No sores or lesions.  LYMPH:  No head and neck axillary or groin lymphadenopathy.   LABORATORY VALUES:  His white count is 10.5, hemoglobin 13.8.  His  chemistries are unremarkable except for potassium 3.5.  His amylase is  149 which is slightly elevated, but his lipase is normal.  His total  bilirubin was 1.3.  The rest of liver function tests are normal.  Xray  of the abdomen shows a dilated small bowel loop in the center question  of bowel obstruction versus pancreatitis.   ASSESSMENT/PLAN:  A 65 year old male with sudden episode of nausea,  vomiting, and diarrhea and seems to be improving, probable  gastroenteritis versus early partial small bowel obstruction.  1. I agree with admission.  2. IV fluids.  3. NPO.  4. Serial abdominal exams.  5. CT scan of the abdomen and pelvis with oral and IV contrast.  No      surgical intervention at this time.  If he worsens, he gets an NG      tube.  If he worsens more than that, then consider surgery, but I      have a feeling he will improve rapidly with hydration.      Ardeth Sportsman, MD  Electronically Signed     SCG/MEDQ  D:  06/02/2006  T:  06/02/2006  Job:  [id number]

## 2010-08-31 NOTE — H&P (Signed)
NAME:  SHUNSUKE, GRANZOW NO.:  [id number]   MEDICAL RECORD NO.:  [id number]          PATIENT TYPE:  EMS   LOCATION:  ED                           FACILITY:  [facility]   PHYSICIAN:  Lebron Conners, M.D.   DATE OF BIRTH:  [birth year]   DATE OF ADMISSION:  01/05/2006  DATE OF DISCHARGE:                                HISTORY & PHYSICAL   CHIEF COMPLAINT:  Abdominal pain.   HISTORY OF PRESENT ILLNESS:  This is a 65 year old white male with a two day  history of abdominal pain and vomiting.  He has a history of small  intestinal obstruction occurring about two years ago for which he was  operated on by Dr. Johna Sheriff.  He found adhesions and lysed the adhesions  with resolution of the problem.  The patient said this episode reminds him  of that.  He was seen in [facility] today and x-rays were  done suggesting small bowel obstruction.  He felt somewhat better when I saw  him.  White count was normal.  Chemistries were normal.  I felt that perhaps  this might be a self-limited illness rather than obstruction.  However, I  did a CT scan which demonstrates high grade small bowel obstruction in the  left midabdomen with some very definitely decompressed loops distally in the  pelvis.  No masses were seen and no evidence of inflammation was seen.  He  is admitted to the hospital for treatment and observation.   PAST MEDICAL HISTORY:  No chronic GI problems.  He denies heart and lung  problems or other serious chronic ailments.  He is on no medications and has  no medicine allergies.   SOCIAL HISTORY:  He is a nonsmoker.  He drinks alcoholic beverages  occasionally and moderately.   FAMILY HISTORY:  Unremarkable.   CHILDHOOD ILLNESSES:  Unremarkable.   REVIEW OF SYSTEMS:  15-point review of systems is altogether unremarkable.   PHYSICAL EXAMINATION:  GENERAL:  The patient is in no acute distress.  He  was getting up and walking around without problems.  VITAL  SIGNS:  Unremarkable as recorded by the nurse.  HEENT/NECK:  Unremarkable with moist mucous membranes, no icterus, no  masses, no thyroid enlargement.  HEART:  The heart rate and rhythm normal, no murmur or gallops detected.  No  chest wall tenderness.  CHEST:  Clear to auscultation.  ABDOMEN:  Mild distention, soft and nontender.  Hyperactive bowel sounds.  No hernia detected.  RECTAL/PELVIC:  Not done.  EXTREMITIES:  No edema.  Skin intact.  Good pulses.  LYMPH NODES:  Not enlarged in neck, axilla or groin.  NEUROLOGIC:  Grossly normal.   IMPRESSION:  Adhesive small intestinal obstruction.   PLAN:  Nasogastric suction and close follow-up.      Lebron Conners, M.D.  Electronically Signed     WB/MEDQ  D:  01/05/2006  T:  01/07/2006  Job:  [id number]

## 2010-08-31 NOTE — Discharge Summary (Signed)
NAME:  Richard Middleton, Richard Middleton NO.:  [id number]   MEDICAL RECORD NO.:  [id number]          PATIENT TYPE:  INP   LOCATION:  1333                         FACILITY:  [facility]   PHYSICIAN:  Angelia Mould. Derrell Lolling, M.D.DATE OF BIRTH:  22-Oct-1945   DATE OF ADMISSION:  10/01/2006  DATE OF DISCHARGE:  10/05/2006                               DISCHARGE SUMMARY   FINAL DIAGNOSES:  1. Partial small-bowel obstruction, resolved.  2. Remote history of laparotomy for small-bowel obstruction, 2005.   OPERATIONS PERFORMED:  None.   HISTORY:  This is a 65 year old black man with a history of a laparotomy  and lysis of adhesions for small-bowel obstruction in 2005.  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  It appears he was hospitalized in December 2007 with  some small-bowel obstruction which resolved after extensive workup.   The current episode began 24 hours prior to this admission after he had  eaten a large meal and eaten rapidly, and he developed right lower  quadrant and lower abdominal crampy pain and vomiting.  He had a  semiformed bowel movement later that night.  He came to the emergency  room the day of admission with continued crampy pain.  A CT scan was  obtained which suggested a partial small-bowel obstruction.  He had  blood work which was all unremarkable.  I was asked to see him.   At the time that I saw him in the emergency room, he was pain free and  had not had nausea or vomiting for 4 or 5 hours and felt much better.   PHYSICAL EXAMINATION:  GENERAL:  Pleasant middle-aged man in no  distress.  VITAL SIGNS:  Temperature 97.2, pulse 56, blood pressure 125/82.  ABDOMEN:  Soft and nontender.  It did not appear distended.  Bowel  sounds were hypoactive.  The spleen and liver were not enlarged.  There  was a lower midline scar.  No hernias noted.  No masses noted.  Slight  increased temperature to percussion of the  upper abdomen.  No guarding  or tenderness anywhere.   ADMISSION DATA:  White blood cell count 6400.  Lipase 19.   HOSPITAL COURSE:  The patient was admitted for observation.  His x-rays  were more impressive than his clinical appearance.  The following day,  he said he had had a little bit of pain that night but it had completely  resolved.  His lab work was again normal.  His x-rays continued to show  a little bit of dilated small bowel, although it was not clear if this  was a bowel obstruction or ileus.   On October 03, 2006 and October 04, 2006, we did a small-bowel follow-through  which initially looked like a partial small-bowel obstruction, but  eventually the barium went on through into the colon, and the bowel was  decompressed.  He felt well, advanced his diet to a regular diet.  His  bowels began moving, and clinically he was not obstructed.  He  was  discharged on October 05, 2006.  He was asked to follow up in the office in  about 2 weeks.      Angelia Mould. Derrell Lolling, M.D.  Electronically Signed     HMI/MEDQ  D:  11/04/2006  T:  11/04/2006  Job:  [id number]   cc:   Barbette Hair. [place], DO  [address] [place], [place] [address]

## 2010-08-31 NOTE — Consult Note (Signed)
NAME:  Richard Middleton, Richard Middleton NO.:  [id number]   MEDICAL RECORD NO.:  [id number]          PATIENT TYPE:  INP   LOCATION:  1603                         FACILITY:  [facility]   PHYSICIAN:  Anselm Pancoast. Weatherly, M.D.DATE OF BIRTH:  Aug 13, 1945   DATE OF CONSULTATION:  03/22/2006  DATE OF DISCHARGE:                                 CONSULTATION   CHIEF COMPLAINT:  Abdominal pain and distention, nausea and vomiting.   HISTORY:  Richard Middleton is a 65 year old male who has been seen in our  service over the last couple of years with the following history.  Approximately 2 years ago (I think it was at [facility]) he was admitted with  a small bowel obstruction and after 2 days of NG suction, etc. Dr.  Johna Sheriff explored the patient. He had no previous abdominal surgery and  was found to have a transition done in the terminal ileum thought  probably to be related a small adhesion, but with manipulation of the  intestine, the obstruction was released and no other definitive etiology  was identified. He did well for 2 years and then in September was  readmitted for a similar episode by Dr. Orson Slick. This one was treated  with an NG tube. The original x-rays looked like a small bowel  obstruction but with a short period of NG suction the symptoms subsided  and he was released. The present episode of pain started about 12 hours  previously with cramping, bloating, distention and vomiting and he  presented to the emergency room on March 22, 2006 in the early a.m. He  was evaluated by the ER physician. Hospitalist was consulted and  arranged for admission. A CT showed a very dilated distal small bowel  and with kind of a transition zone in the right lower quadrant. The  patient had a moderate amount of stool within his colon and after  arrangements had been made for him to be admitted, I was asked to see  the patient for a surgical consultation. The patient had received  Dilaudid earlier and had an NG  tube in that was not functioning. We  repositioned it and got it to work. He said that he was feeling better  and on discussion with him about the x-rays, etc., He said he would  really prefer not to have any surgery unless it was persistent since he  had been admitted previously as I find from old records. It appeared on  his previous admission by Dr. Orson Slick that no small bowel series or etc.,  had been performed after his symptoms subsided after a short period of  NG suctioning. This is a similar episode but he has not had cramping and  bloating and symptoms in the interim.   ALLERGIES:  The patient has No known allergies.   He is on no chronic medications. He has tried Maalox and Mylanta and  Pepcid. He still has his appendix and all other organs as there had been  no previous surgery prior to Dr. Jamse Mead surgery to cause adhesions.   PHYSICAL EXAMINATION:  GENERAL: At  this time he is resting comfortably.  VITAL SIGNS: He is afebrile.  ABDOMEN: Is a little full but not maximally distended by any means and I  feel no evidence of any umbilical incisional or groin hernias.   His white count is normal. He is afebrile and he certainly has no  localized abdominal tenderness. I am in agreement that the NG suction  and conservative management is certainly preferable and will repeat a  KUB and x-ray in the morning. If he is clinically improved I would  definitely get a small bowel series this hospitalization as this is the  second episode of what appears to be a mechanical obstruction over a  short period of time. If this becomes a reoccurring problem, we will  probably need a repeat laparotomy. The patient still has his appendix  but there was no evidence of any inflammation around the appendix on the  CT that I reviewed. The patient does have a moderate  amount of solid stool in his colon. I think a soapsuds enema would be  helpful on eliminating that but I doubt that the stool in the  colon is  the cause of what appears to be a transition point in the distal ileum  pretty close to the ileocecal valve but certainly on CT there is nothing  that looks like Crohn's disease, etc.           ______________________________  Anselm Pancoast. Zachery Dakins, M.D.     WJW/MEDQ  D:  03/23/2006  T:  03/23/2006  Job:  [id number]

## 2010-08-31 NOTE — Op Note (Signed)
NAME:  Richard Middleton, Richard Middleton NO.:  [id number]   MEDICAL RECORD NO.:  [id number]          PATIENT TYPE:  INP   LOCATION:  5702                         FACILITY:  [facility]   PHYSICIAN:  Sharlet Salina T. Hoxworth, M.D.DATE OF BIRTH:  1945-04-30   DATE OF PROCEDURE:  01/06/2004  DATE OF DISCHARGE:                                 OPERATIVE REPORT   PREOPERATIVE DIAGNOSIS:  Small bowel obstructions.   POSTOPERATIVE DIAGNOSIS:  Small bowel obstructions.   PROCEDURE:  Laparotomy and lysis of adhesions for small bowel obstructions.   SURGEON:  Lorne Skeens. Hoxworth, M.D.   ANESTHESIA:  General.   BRIEF HISTORY:  Richard Middleton is a 65 year old black male with no previous  history of abdominal surgery, who presented to the [facility]  Emergency Room 48 hr ago with persistent severe crampy midabdominal pain,  nausea and vomiting.  Plain X-rays revealed small bowel obstruction.  The  patient was admitted and treated with NG suction and IV fluids.  A follow-up  CT scan yesterday showed persistent small bowel obstruction, with a discrete  area of transition in the mid ileum, with no definite mass, adenopathy or  other intrinsic abnormality of the bowel seen.  Due to persistent  obstruction, laparotomy has been recommended, with some concern over  possible small bowel intrinsic tumor or inflammatory disease (as the patient  has no history of previous surgery).  He has had some symptoms over the past  year of episodic pain and vomiting that would resolve on its own, gradually  worsening.   The nature of the procedure, the indications, risks of bleeding and  infection and the possible need for bowel resection were discussed and  understood.  The patient is now brought to the operating room for this  procedure.   DESCRIPTION OF PROCEDURE:  The patient brought to the operating room and  placed in the supine position on the operating room table.  General  endotracheal anesthesia was  induced.  He received preoperative antibiotics.  PS were in place.  The abdomen was widely sterilely prepped and draped.   A low midline incision skirting the umbilicus was used and dissection  carried down through subcutaneous tissue and midline fascia using cautery.  The peritoneum entered under direct vision.  There was a moderate amount of  slightly hemorrhagic peritoneal fluid.  There was obviously dilated small  bowel.  Ligament of Treitz was identified, which was not particularly  dilated but as the bowel was traced distally it became quite dilated and air-  filled.  The bowel was brought out onto the abdominal wall to trace it, and  as I went distally I encountered a fairly discrete area about 18 inches long  of distended small bowel with hemorrhage in the wall, along the ante  mesenteric border.  Hemorrhage was along the mesentery, as well as might  typically be seen in a closed obstruction secondary to adhesions.  There was  a bruise just at the end of this area, that conceivably could have been  against an adhesion.  There was, however, no band  identified at this point  after eviscerating the bowel.  Beyond this section the bowel abruptly  transitioned to normal decompressed bowel, and was normal all the way to the  ileocecal valve and cecum.  There was a little bit of air in the colon that  was relatively decompressed, and otherwise appeared normal.  The stomach,  duodenum retroperitoneum, liver and gallbladder appeared normal.  I looked  for any possible source of adhesion to the adjacent colon and mesentery  retroperitoneum, but did not see any obvious point.  The bowel, however,  again did have a typical appearance of obstruction secondary to adhesive  band, and I suspect that he may have had congenital or inflammatory adhesion  that was broken up while exploring the bowel.  The bowel was again run from  ligament of Treitz to the ileocecal valve, carefully inspecting; there  was  no intrinsic abnormality to the bowel, other than the reactive distention  and hemorrhage.  There was no adenopathy in the mesentery to suggest  inflammatory bowel disease.  Certainly there was no thickening in the bowel  wall.   At this point the viscera was returned to its anatomic position.  The  abdomen was copiously irrigated with warm saline.  Hemostasis was assured.  The midline fascia was then closed using running #1 PDS, begun at either end  of the incision and tied centrally.  The subcutaneous tissue was irrigated  and the skin closed with staples.   Sponge, needle and instrument counts were correct.  Dry sterile dressings  were applied and the patient taken to the recovery room in good condition.       BTH/MEDQ  D:  01/06/2004  T:  01/07/2004  Job:  [id number]

## 2010-08-31 NOTE — H&P (Signed)
NAME:  Richard Middleton, Richard Middleton NO.:  [id number]   MEDICAL RECORD NO.:  [id number]          PATIENT TYPE:  INP   LOCATION:  1339                         FACILITY:  [facility]   PHYSICIAN:  Lonia Blood, M.D.       DATE OF BIRTH:  12/02/1945   DATE OF ADMISSION:  06/02/2006  DATE OF DISCHARGE:                              HISTORY & PHYSICAL   PRIMARY CARE PHYSICIAN:  The patient does not have a primary care  physician.   HISTORY OF PRESENT ILLNESS:  [name] is a 65 year old gentleman with an  episode small-bowel obstruction and status post resection in 2005.  She  presents to [facility] Emergency Room after an episode of nausea,  vomiting about 10x, and diarrhea about 4x.  The patient reports that he  has had significant abdominal pain today.  He feels much better after he  received a dose of morphine in the emergency room.  The patient does not  have any significant medical history except his abdominal surgery.   MEDICATIONS:  None.   ALLERGIES:  None.   SOCIAL HISTORY:  The patient works as a Surveyor, minerals for [employer].  He is  married.  He has a son that is in the eighth grade.  He does not drink  alcohol and does not smoke cigarettes.  He exercises daily.   FAMILY HISTORY:  Positive for a DVT in his father and positive for  diabetes in his grandmother.   REVIEW OF SYSTEMS:  Negative for chest pain.  Negative for shortness of  breath.  Negative for cough.  Negative fever, chills, focal weakness, or  numbness.  All other systems are negative.   PHYSICAL EXAMINATION:  VITAL SIGNS:  Upon admission shows a temperature  of 97.6, blood pressure 119/78, pulse 73, respirations 18, saturation  100% on room air.  GENERAL APPEARANCE:  A well-developed, well-nourished African-American  gentleman, fit, alert, oriented place, person, and time.  HEENT:  Head is normocephalic, atraumatic.  Eyes: Pupils equal AND round  reactive to light and accommodation.  Conjunctivae are pink.   Sclerae  anicteric.  Throat clear.  NECK:  Supple.  No JVD.  No carotid bruits.  CHEST:  Clear to auscultation bilaterally without wheeze, or crackles.  HEART:  Regular rate and rhythm without murmurs, rubs, or gallop.  ABDOMEN:  The patient's abdomen is soft, nontender, nondistended.  Bowel  sounds are present.  EXTREMITIES:  Lower extremities have no edema.  SKIN:  Warm and dry without any suspicious rashes.   LABORATORY VALUES ON ADMISSION:  White blood cell count 10.5, hemoglobin  13.8, platelet count is 249.  Sodium 136, potassium 3.5, chloride 99,  bicarb 27, BUN 9, creatinine 1, calcium 10.  Albumin 4.1, AST-ALT within  normal limits, amylase 149, and lipase 36.  White blood cell count 8,  hemoglobin 12.6, platelet count is 228.   An abdominal x-ray shows a dilated loop of small bowel in the abdomen.   ASSESSMENT/PLAN:  Nausea, vomiting, and abdominal pain.  The  differential diagnosis includes an acute gastroenteritis versus a  recurrent small-bowel obstruction.  The patient will have a computer  tomography scan of his abdomen to further delineate the diagnosis.  Clinically, now, he does not appear to have a significant high-grade  small-bowel obstruction that will require immediate surgery.  The  patient will be given a clear liquid diet intravenous fluids and the  computer tomography scan of the abdomen and pelvis will be followed  upon.      Lonia Blood, M.D.  Electronically Signed     SL/MEDQ  D:  06/02/2006  T:  06/03/2006  Job:  [id number]   cc:   [facility]

## 2010-08-31 NOTE — Discharge Summary (Signed)
NAME:  Richard Middleton, Richard Middleton NO.:  [id number]   MEDICAL RECORD NO.:  [id number]          PATIENT TYPE:  INP   LOCATION:  1339                         [facility]ACILITY:  [facility]   PHYSICIAN:  Richard Sportsman, MD     DATE OF BIRTH:  [birth year]   DATE OF ADMISSION:  06/02/2006  DATE OF DISCHARGE:  06/07/2006                               DISCHARGE SUMMARY   PRIMARY CARE PHYSICIAN:  Dr. Mikeal Middleton [id number]   SURGEON:  Richard Middleton.   DISCHARGE DIAGNOSES:  1. Partial small bowel obstruction, resolved.  2. 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  3. Status post exploratory laparotomy with lysis of adhesions in 2005      by Dr. Jaclynn Middleton.   SUMMARY OF HOSPITAL COURSE:  Richard Middleton is a pleasant physically active 65-  year-old male, who has struggled with bowel obstructions in the past.  Please see history and physical for further details.  He has been able  to recover from these events nonoperatively back in 2007, but he was  admitted with signs and symptoms concerning for partial small bowel  obstruction on June 02, 2006.  The patient had a CT scan which  showed a high-grade transition zone near the midline of his umbilicus.   He had an NG tube placed and actually started to show marked improvement  with passing of enteral contrast into his colon, bowel movements, and  flatus.  His NG tube dropped off, and it was removed.  The patient  walked numerous, numerous times.  By the time of discharge, he was able  to tolerate a solid, well-chewed diet with no nausea or vomiting.  He  was afebrile with vital signs stable.  His x-rays showed improvement.   Basically he has improved, and it was decided it would reasonable to  discharge him home with the following instructions:  1. He is to follow up with Dr. Mikeal Middleton as a new patient.  Dr. Lavera Middleton with      [facility] had seen the patient and  recommended Dr. Mikeal Middleton to the      patient who wishes at this time to have a primary care physician.      I do not think he has any major health issues at this point, but      this would be preventative and promotion of general health.  2. He should follow up with [facility] in a couple of      weeks to make sure he has stabilized.  The patient has had a      relationship with Dr. Zachery Middleton and wished to continue that.  He      already has an established appointment in mid March.  Perhaps if he      has a recurrent bowel obstruction, he may require exploration with      possible bowel resection if there is this persistent tight area but      since there is no strong  evidence of any lymphadenopathy or cancer      or worsening changes and the patient wished to avoid surgery at      this time, we felt      it was reasonable since he was improved to allow him to recover and      avoid surgery.  3. He should call if he has any worsening fever, chills, sweats,      nausea and vomiting, uncontrolled pain.  4. He is on no medications, and I did not recommend any new      medications for him.      Richard Sportsman, MD  Electronically Signed     SCG/MEDQ  D:  06/07/2006  T:  06/07/2006  Job:  [id number]   cc:   Richard Middleton, M.D.   Anselm Pancoast. Richard Middleton, M.D.  [address], [address]  [place]  [place] [address]

## 2010-08-31 NOTE — Assessment & Plan Note (Signed)
[facility]                           PRIMARY CARE OFFICE NOTE   HOBIE, KOHLES                         MRN:          [id number]  DATE:08/01/2006                            DOB:          03/13/1946    CHIEF COMPLAINT:  New patient in practice.   HISTORY OF PRESENT ILLNESS:  The patient is a 65 year old African-  American male here to establish primary care.  He has not had a primary  care physician in the past.  He was admitted on February of 2008  secondary to small bowel obstruction.  He previously had a bout of small  bowel obstruction and was status post resection in 2005.  There was no  clear etiology.  He presented in February with nausea and vomiting and  diarrhea.  The patient was taken to the OR for exploratory lap.  There  was a question of a mild jejunal stricture.  Patient also underwent  lysis of adhesion at that time.   Otherwise, patient has been quite healthy.  He is very health conscious  and exercises regularly and follows a healthy diet.  There is no history  of hypertension, high cholesterol or diabetes   PAST MEDICAL HISTORY SUMMARY:  Small bowel obstruction x2, status post  resection in 2005.   CURRENT MEDICATIONS:  1. Men's One A Day vitamin a day.  2. Zinc supplement p.r.n.   ALLERGIES TO MEDICATIONS:  None known.   SOCIAL HISTORY:  The patient is married, lives with his wife, works as a  Counselling psychologist.   FAMILY HISTORY:  Mother is age 37, fairly healthy.  Father is age 57,  has a history of DVT.  There is diabetes in his grandmother.  He denies  any family history of colon cancer.   REVIEW OF SYSTEMS:  No HEENT symptoms.  Patient has had irritated skin,  chronic from shaving.  No chest pain, no dyspnea with exertion, no  current GI symptoms.  Denies any dysuria, denies weak stream, no history  or report of erectile dysfunction.  All other systems negative.   PHYSICAL EXAMINATION:  VITAL SIGNS:   Weight is 174 pounds, temperature  is 96.8, pulse is 63, BP is 120/74 in the left arm in a seated position.  IN GENERAL:  The patient is a pleasant, well-developed, well-nourished,  65 year old African-American male, in no apparent distress.  HEENT:  Normocephalic, atraumatic.  Pupils are equal and reactive to  light bilaterally.  Extraocular motility was intact.  Patient was  anicteric.  Conjunctivae were within normal limits.  External auditory  canals and tympanic membranes were clear bilaterally.  Oropharyngeal  exam was unremarkable.  Examination of his skin reveals increased pigmentation around his neck  and lower mandible.  Otherwise, neck exam was normal.  No thyromegaly,  no carotid bruit.  CHEST EXAM:  Normal inspiratory efforts.  Chest was clear to  auscultation bilaterally.  No rhonchi, rales, or wheezing.  CARDIOVASCULAR:  Regular rate and rhythm, no significant murmurs, rubs,  or gallops appreciated.  ABDOMEN:  Soft, nontender, positive bowel sounds, no  organomegaly.  DIGITAL RECTAL EXAM:  Deferred today.  NEUROLOGIC:  Cranial nerves II-XII are grossly intact.  He was nonfocal.  He had intact dorsalis pedis pulses, equal and symmetrical.   E Chart review:  Normal kidney function, liver function tests, but  elevated blood sugar readings during hospitalization.   IMPRESSION:  1. History of small bowel obstruction x2, status post resection in      2005.  2. Abnormal glucose.  3. Health maintenance.   RECOMMENDATIONS:  He will have followup labs including A1c and repeat  fasting CBG.  Recommended also screening for prostate cancer.  We  deferred digital rectal exam for followup visit.   I recommended screening colonoscopy.  He was updated with tetanus and  pertussis vaccine.   Followup is in June of 2008.     Barbette Hair. Artist Pais, DO  Electronically Signed    RDY/MedQ  DD: 08/01/2006  DT: 08/01/2006  Job #: [id number]

## 2010-08-31 NOTE — Discharge Summary (Signed)
NAME:  Richard Middleton, Richard Middleton NO.:  [id number]   MEDICAL RECORD NO.:  [id number]          PATIENT TYPE:  INP   LOCATION:  5704                         FACILITY:  [facility]   PHYSICIAN:  Sharlet Salina T. Hoxworth, M.D.DATE OF BIRTH:  1946/02/24   DATE OF ADMISSION:  01/04/2004  DATE OF DISCHARGE:  01/10/2004                                 DISCHARGE SUMMARY   DISCHARGE DIAGNOSES:  Small bowel obstruction.   OPERATION/PROCEDURE:  Laparotomy and lysis of adhesions for small bowel  obstruction on January 06, 2004.   HISTORY OF PRESENT ILLNESS:  Richard Middleton is a previously healthy 65 year old  male who presents with an 18-hour history of abdominal pain, nausea, and  vomiting.  This was diffuse cramping pain and vomiting of undigested food.  He and his wife state that for about the past year or two he has had  intermittent similar episodes somewhat milder and more short-lived for which  he did not seek medical attention.  He has no previous abdominal surgery or  a history of GI problems.   PAST MEDICAL HISTORY:  No abdominal or any other surgery.  No  hospitalizations or serious illness.   MEDICATIONS:  None.   ALLERGIES:  None.   SOCIAL HISTORY:  See detailed  H&P.   FAMILY HISTORY:  See detailed H&P.   REVIEW OF SYSTEMS:  See detailed H&P.   PHYSICAL EXAMINATION:  VITAL SIGNS:  He is afebrile.  Vital signs all within  normal limits.  GENERAL:  Fit-appearing black male in no acute distress.  ABDOMEN:  Mild distention, hypoactive bowel sounds.  There is moderate to  diffuse tenderness.  No palpable masses or hepatosplenomegaly.   LABORATORIES:  White count 10.3.  Amylase normal.  Generally unremarkable.  Flat and upright abdominal x-rays show moderately dilated air filled loops  of small bowel consistent with partial early small bowel obstruction.   HOSPITAL COURSE:  Patient was admitted with an impression of early small  bowel obstruction.  NG tube was placed with some  improvement in his  distention.  CT scan of the abdomen was obtained which revealed high grade  small bowel obstruction with a transition point in the mid ileum, questioned  thickened loop of abnormal bowel in this area.  Due to findings on CT and  particularly with history of progressive worsening episodes, we elected to  proceed with exploration.  This was performed on September 23.  Operative  findings were felt to be secondary to an adhesion with bowel obstruction and  the adhesion was broken up during the procedure without definitely  identifying the adhesion itself, although there was a clear transition point  in the mid small bowel, but no intrinsic small bowel defect.  Please see  operative report for details.  His postoperative course was smooth.  NG tube  was removed on the second postoperative day and he was started on clear  liquids.  He had flatus by the third postoperative day and diet was  advanced.  By the fourth postoperative day he was tolerating a full liquid  diet and had  several loose stools.  Abdomen was soft, nontender.  Wound was  healing well.  He was discharged home at this time.   DISCHARGE MEDICATIONS:  Tylox for pain.   FOLLOWUP:  In my office in one to two weeks.      Benj   BTH/MEDQ  D:  04/02/2004  T:  04/03/2004  Job:  [id number]

## 2010-08-31 NOTE — H&P (Signed)
NAME:  Richard Middleton, Richard Middleton NO.:  [id number]   MEDICAL RECORD NO.:  [id number]          PATIENT TYPE:  INP   LOCATION:  1827                         FACILITY:  [facility]   PHYSICIAN:  Sharlet Salina T. Hoxworth, M.D.DATE OF BIRTH:  10/20/1945   DATE OF ADMISSION:  01/04/2004  DATE OF DISCHARGE:                                HISTORY & PHYSICAL   CHIEF COMPLAINT:  Abdominal pain, nausea and vomiting.   HISTORY OF PRESENT ILLNESS:  Richard Middleton is a generally healthy 65 year old  black male who presents to the [facility] Emergency Room with an  approximately 18 hour history of abdominal pain, nausea and vomiting.  He  initially had the onset of abdominal pain late last night which he describes  as mid abdominal pain.  It became quite severe.  He describes a somewhat  constant pain with cramp like exacerbation spreading out across his abdomen.  This was followed soon after by nausea and vomiting of undigested food.  No  hematemesis or bilious vomiting.  He did have a relatively normal bowel  movement this morning and previously had been having normal bowel movements  without constipation, diarrhea, or blood.  He has had no fever or chills.  He and his wife state that for approximately one to two years he has had  occasional episodes of very similar symptoms, although they have not been as  severe and have been self limited, and he did not seek medical attention.  Between these episodes which previously have just lasted a few hours, he  feels well without any indigestion, chronic abdominal discomfort, lack of  appetite, weight change or any other abdominal or GI symptoms chronically.  He has had no previous abdominal surgery.   PAST MEDICAL HISTORY:  1.  No abdominal or any other surgery.  2.  No hospitalizations or serious illness.   MEDICATIONS:  None.   ALLERGIES:  None.   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  He is married.  He quit  smoking in his 67s.  Rare alcohol use.   FAMILY HISTORY:  Parents are both alive without any serious illness.   REVIEW OF SYSTEMS:  GENERAL:  No fever, chills, weight change, malaise.  HEENT:  No vision, hearing, swallowing problems.  RESPIRATORY:  No shortness  of breath, cough, wheezing.  CARDIAC:  No chest pain, palpitations,  swelling, or history of heart disease.  ABDOMEN:  GI as above.  GU:  No  urinary burning, frequency, hesitancy.  MUSCULOSKELETAL:  No joint pain,  swelling.  NEUROLOGIC:  No numbness, weakness, syncope.   PHYSICAL EXAMINATION:  VITAL SIGNS:  Temperature is 97.5, pulse 63, blood  pressure 110/64, respirations 22.  GENERAL:  A fit-appearing black male in no acute distress.  SKIN:  Warm and dry without rash or infection.  HEENT:  No palpable masses or thyromegaly.  Sclerae nonicteric.  Nares and  oropharynx clear.  LYMPH NODES:  No cervical, supraclavicular, or inguinal nodes palpable.  LUNGS:  Clear to auscultation without increased work of breathing.  CARDIAC:  Regular rate and rhythm without murmurs and no JVD or edema.  ABDOMEN:  Minimal distention.  Bowel sounds are hypoactive.  There is mild  to moderate diffuse tenderness but the abdomen is soft without guarding.  No  palpable masses or hepatosplenomegaly.  GENITALIA:  Normal male.  RECTAL:  No tenderness.  No masses.  No stool.  Prostate normal.  EXTREMITIES:  No joint swelling, deformity.  NEUROLOGIC:  He is alert, fully oriented.  Motor and sensory exams are  grossly normal.   LABORATORY:  Amylase 107.  White count 10.3, hemoglobin 12.3.  Electrolytes,  LFTs, urinalysis all normal.   Flat and upright abdominal x-ray shows some moderately dilated air filled  loops of small bowel consistent with partial or early small-bowel  obstruction.   ASSESSMENT/PLAN:  Clinical picture and x-rays indicating small-bowel  obstruction.  He has not had previous abdominal surgery.  Differential would  include  inflammatory or congenital adhesions, inflammatory bowel disease,  benign or malignant neoplasms, or internal hernia.  The patient will be  admitted for nasogastric suction, intravenous fluids, and symptom control.  We will initially obtain a CT scan of the abdomen to try to determine the  cause of the obstruction.  He does not seem to have indication at the  present for emergency laparotomy.       BTH/MEDQ  D:  01/04/2004  T:  01/05/2004  Job:  [id number]

## 2010-08-31 NOTE — H&P (Signed)
NAME:  Richard Middleton, Richard Middleton NO.:  [id number]   MEDICAL RECORD NO.:  [id number]          PATIENT TYPE:  INP   LOCATION:  1603                         FACILITY:  [facility]   PHYSICIAN:  Theresia Bough, MD       DATE OF BIRTH:  1945-11-08   DATE OF ADMISSION:  03/22/2006  DATE OF DISCHARGE:                              HISTORY & PHYSICAL   PRESENTING COMPLAINT:  Abdominal pain and vomiting.   HISTORY OF PRESENT ILLNESS:  This is a 65 year old African-American male  patient who got sick yesterday.  He developed abdominal pain.  He  developed nausea and vomiting.  Patient has not been able to retain any  meals since 7:00 p.m. last night.  The patient moved his bowels this  morning.  He has also been passing gas.  Abdominal pain is about 6/10.  It was relieved by IV Dilaudid.  He has vomited about three times this  morning.  No chest pain.  No shortness of breath.  No headaches.  No  dizziness.  No dysuria.  No joint pain.  No feet swelling.   PAST MEDICAL HISTORY:  History of bowel obstruction.  He had abdominal  surgery three years ago because of bowel obstruction.  The patient was  seen in September, 2007 because of abdominal pain that was managed  conservatively.   SOCIAL HISTORY:  Patient denies cigarette smoking.  Denies alcohol.  No  history of drug use.   MEDICATIONS:  Patient was not on any home medications.   FAMILY HISTORY:  Noncontributory.   ALLERGIES:  No known drug allergies.   PHYSICAL EXAMINATION:  VITAL SIGNS:  Blood pressure 112/78, pulse rate  78, respiration 16, temperature 98.1.  HEAD/NECK:  Pink conjunctivae.  There is no jaundice.  His mucous  membranes looks dry.  He has an NG tube in place.  CHEST:  Symmetrical respirations.  On auscultation, clear breath sounds.  CARDIOVASCULAR:  Normal heart sounds.  No murmur or gallop.  ABDOMEN:  Soft.  Very mild tenderness.  There is no rebound tenderness.  No masses palpable.  EXTREMITIES:  No edema.   There is no cyanosis.  CENTRAL NERVOUS SYSTEM:  Patient is alert and oriented to time, place,  and person.  Speech is clear.  Power is 4/4 in all limbs.   Labs show sodium 135, potassium 3.7, chloride 100, bicarb 26, BUN 15,  creatinine 1.1, glucose 167.  WBC 8.1, hemoglobin 14.7, hematocrit 43.9,  platelets 239.   CT of the abdomen and pelvis with oral contrast is pending.   ASSESSMENT:  Query intestinal obstruction, also rule out inflammation.   The plan is for patient to have a CT of the abdomen and pelvis.  NG tube  to suction any GI secretions.  Continue IV fluids on normal saline with  potassium at 100 cc/hr.  Patient __________ Reglan or Zofran p.r.n. for  nausea and vomiting.  Dilaudid 0.5 to 1 mg IV q.4h. p.r.n. severe pain.  I will also consult surgery to see patient.  Patient is to be admitted  to med/surg.  Theresia Bough, MD  Electronically Signed     GA/MEDQ  D:  03/22/2006  T:  03/22/2006  Job:  [id number]

## 2010-08-31 NOTE — Discharge Summary (Signed)
NAME:  Richard Middleton, Richard Middleton                  ACCOUNT NO.:  [id number]   MEDICAL RECORD NO.:  [id number]          PATIENT TYPE:  INP   LOCATION:  1603                         FACILITY:  [facility]   PHYSICIAN:  Mobolaji B. Bakare, M.D.DATE OF BIRTH:  05/04/1945   DATE OF ADMISSION:  03/22/2006  DATE OF DISCHARGE:  03/26/2006                               DISCHARGE SUMMARY   PRIMARY CARE PHYSICIAN:  Patient is unassigned.  Patient will follow up  with Dr. Lonia Blood in the outpatient setting upon discharge.   FINAL DIAGNOSIS:  Small bowel obstruction.   CONSULTS:  Surgical consult, Dr. Zachery Dakins.   PROCEDURES:  1. Small bowel series done on March 25, 2006.  It showed a long      segment of dilated and mid small bowel with a stacked coin      appearance.  No visible obstructing lesion.  Findings most      consistent with an obstructed loop that is resolving when compared      to CT scan done on March 22, 2006.  2. CT scan of the abdomen done on March 22, 2006 showed dilated      small bowel loops consistent with small bowel obstruction.  No      obvious cause noticed.  Small hepatic cysts.  On pelvic      examination, there is small bowel obstruction with transition to      nondilated decompressed small bowel loops in the pelvis, likely due      to adhesions in the proximal mid ileum in the midline just above      the iliac crest.  No pelvic masses or adenopathy.  3. Follow-up chest x-ray done on December 10th showed no change in the      partial small bowel obstruction pattern.   HISTORY:  Richard Middleton is a 65 year old African-American male with no  known past medical history except for laparotomy for small bowel  obstruction in 2005.  In September, 2005, he presented with small bowel  obstruction.  He never had any surgical procedure done prior to this.  He underwent laparotomy, and the findings were small bowel obstruction  with a discrete area of transition in the mid ileum without  any definite  mass or adenopathy.  He presented again in September, 2007 with small  bowel obstruction, and this resolved without any surgical intervention.   Patient again presented on the 8th of December with abdominal pain and  vomiting.  CT scan and abdominal x-ray was consistent with small bowel  obstruction.  He was admitted for further treatment.  He had no fever or  leukocytosis.   HOSPITAL COURSE:  Richard Middleton was placed on NG tube, n.p.o., and he  received IV Dilaudid for pain management.  He was seen by Dr. Zachery Dakins,  and he recommended small bowel series.  Report of small bowel series is  as noted above.  There is no intrinsic or extrinsic etiology.  This  bowel obstruction is likely secondary to bowel adhesions.   Patient was on NG tube for about two days.  He was subsequently started  on clear liquids, and his diet has been advanced to full-liquid diet.  He is tolerating a full-liquid diet.  He has been able to move his  bowels, and there is no abdominal distention or pain.  He will be tried  on a regular diet.  If he tolerates this, patient will be discharged  home and to follow up with a PCP.   DISCHARGE LABORATORY DATA:  Sodium 159, potassium 3.9, chloride 105,  bicarb 28, BUN 7, creatinine 1.1, glucose 89, calcium 9.  White cells  4.4, hemoglobin 13.1, hematocrit 38.5, platelets 223.   Follow up with Dr. Lonia Blood in 1-2 weeks.      Mobolaji B. Corky Downs, M.D.  Electronically Signed     MBB/MEDQ  D:  03/26/2006  T:  03/26/2006  Job:  [id number]

## 2011-01-30 LAB — COMPREHENSIVE METABOLIC PANEL
ALT: 23
AST: 28
Calcium: 9.5
GFR calc Af Amer: >60
Sodium: 136
Total Protein: 7.1

## 2011-01-30 LAB — URINALYSIS, ROUTINE W REFLEX MICROSCOPIC
Bilirubin Urine: NEGATIVE
Glucose, UA: NEGATIVE
Glucose, UA: NEGATIVE
Ketones, ur: 15 — AB
Ketones, ur: 40 — AB
Nitrite: NEGATIVE
Specific Gravity, Urine: 1.02
Specific Gravity, Urine: >1.046 — ABNORMAL HIGH
pH: 7
pH: 8

## 2011-01-30 LAB — AMYLASE: Amylase: 156 — ABNORMAL HIGH

## 2011-01-30 LAB — CBC
HCT: 38.2 — ABNORMAL LOW
HCT: 41.6
Hemoglobin: 12.7 — ABNORMAL LOW
Hemoglobin: 13.9
MCHC: 33.2
MCHC: 33.3
MCV: 80.8
Platelets: 185
Platelets: 204
RBC: 4.73
RBC: 5.12
RDW: 12.9
RDW: 13.4
RDW: 13.6
WBC: 6.9
WBC: 9.4

## 2011-01-30 LAB — BASIC METABOLIC PANEL
BUN: 7
BUN: 8
CO2: 27
Calcium: 8.6
Calcium: 9.1
Chloride: 104
Chloride: 107
Creatinine, Ser: 1.01
GFR calc Af Amer: >60
GFR calc Af Amer: >60
GFR calc non Af Amer: >60
GFR calc non Af Amer: >60
GFR calc non Af Amer: >60
Glucose, Bld: 123 — ABNORMAL HIGH
Potassium: 3.8
Potassium: 4.1
Potassium: 4.6
Sodium: 138
Sodium: 139
Sodium: 141

## 2011-01-30 LAB — DIFFERENTIAL
Eosinophils Absolute: 0
Eosinophils Relative: 0
Lymphocytes Relative: 8 — ABNORMAL LOW
Lymphs Abs: 0.5 — ABNORMAL LOW
Monocytes Relative: 2 — ABNORMAL LOW

## 2012-01-10 ENCOUNTER — Other Ambulatory Visit (INDEPENDENT_AMBULATORY_CARE_PROVIDER_SITE_OTHER): Payer: Managed Care, Other (non HMO)

## 2012-01-10 DIAGNOSIS — Z0000 Encounter for general adult medical examination without abnormal findings: Secondary | ICD-10-CM

## 2012-01-10 LAB — POCT URINALYSIS DIPSTICK
Leukocytes, UA: NEGATIVE
Protein, UA: NEGATIVE
Urobilinogen, UA: 0.2

## 2012-01-10 LAB — CBC WITH DIFFERENTIAL/PLATELET
Basophils Absolute: 0 10*3/uL (ref 0.0–0.1)
Eosinophils Absolute: 0.1 10*3/uL (ref 0.0–0.7)
HCT: 42.8 % (ref 39.0–52.0)
Lymphs Abs: 1.4 10*3/uL (ref 0.7–4.0)
MCV: 84.1 fl (ref 78.0–100.0)
Monocytes Absolute: 0.3 10*3/uL (ref 0.1–1.0)
Neutrophils Relative %: 67.8 % (ref 43.0–77.0)
Platelets: 186 10*3/uL (ref 150.0–400.0)
RDW: 14.2 % (ref 11.5–14.6)

## 2012-01-10 LAB — BASIC METABOLIC PANEL
BUN: 15 mg/dL (ref 6–23)
Chloride: 104 mEq/L (ref 96–112)
Creatinine, Ser: 1.1 mg/dL (ref 0.4–1.5)
GFR: 88.15 mL/min (ref >60.00)
Glucose, Bld: 88 mg/dL (ref 70–99)
Potassium: 4.6 mEq/L (ref 3.5–5.1)

## 2012-01-10 LAB — LIPID PANEL
LDL Cholesterol: 71 mg/dL (ref 0–99)
VLDL: 6.8 mg/dL (ref 0.0–40.0)

## 2012-01-10 LAB — PSA: PSA: 0.25 ng/mL (ref 0.10–4.00)

## 2012-01-10 LAB — HEPATIC FUNCTION PANEL
Albumin: 3.9 g/dL (ref 3.5–5.2)
Total Protein: 7 g/dL (ref 6.0–8.3)

## 2012-01-24 ENCOUNTER — Ambulatory Visit (INDEPENDENT_AMBULATORY_CARE_PROVIDER_SITE_OTHER): Payer: Managed Care, Other (non HMO) | Admitting: Internal Medicine

## 2012-01-24 ENCOUNTER — Encounter: Payer: Self-pay | Admitting: Internal Medicine

## 2012-01-24 VITALS — BP 116/74 | HR 68 | Temp 98.4°F | Ht 72.0 in | Wt 169.0 lb

## 2012-01-24 DIAGNOSIS — Z23 Encounter for immunization: Secondary | ICD-10-CM

## 2012-01-24 DIAGNOSIS — Z0000 Encounter for general adult medical examination without abnormal findings: Secondary | ICD-10-CM | POA: Insufficient documentation

## 2012-01-24 NOTE — Assessment & Plan Note (Signed)
Reviewed adult health maintenance protocols.  Patient updated with influenza vaccine and Pneumovax. He is up-to-date with colonoscopy. Digital rectal exam was normal. PSA is less than 1. Cholesterol panel is excellent. Continue healthy life style.

## 2012-01-24 NOTE — Progress Notes (Signed)
  Subjective:    Patient ID: Richard Middleton, male    DOB: 1945/11/08, 66 y.o.   MRN: [id number]  HPI  66 year old American male for routine physical. Patient does not have any chronic illnesses. Patient reports he remains healthy and active. No interval hospitalizations or illnesses.  Screening labs reviewed in detail. His cholesterol panel is well-controlled.  Review of Systems   Constitutional: Negative for activity change, appetite change and unexpected weight change.  Eyes: Negative for visual disturbance.  Respiratory: Negative for cough, chest tightness and shortness of breath.   Cardiovascular: Negative for chest pain.  Genitourinary: Negative for difficulty urinating.  Neurological: Negative for headaches.  Gastrointestinal: Negative for abdominal pain, heartburn melena or hematochezia Psych: Negative for depression or anxiety  Past Medical History  Diagnosis Date  . History of small bowel obstruction     x2  . Abnormal glucose     History   Social History  . Marital Status: Married    Spouse Name: N/A    Number of Children: N/A  . Years of Education: N/A   Occupational History  . Not on file.   Social History Main Topics  . Smoking status: Former Games developer  . Smokeless tobacco: Not on file  . Alcohol Use: No  . Drug Use: No  . Sexually Active: Not on file   Other Topics Concern  . Not on file   Social History Narrative  . No narrative on file    Past Surgical History  Procedure Date  . Resection small bowel / closure ileostomy     No family history on file.  No Known Allergies  No current outpatient prescriptions on file prior to visit.    BP 116/74  Pulse 68  Temp 98.4 F (36.9 C) (Oral)  Ht 6' (1.829 m)  Wt 169 lb (76.658 kg)  BMI 22.92 kg/m2           Objective:   Physical Exam  Constitutional: He is oriented to person, place, and time. He appears well-developed and well-nourished.  HENT:  Head: Normocephalic and atraumatic.    Right Ear: External ear normal.  Mouth/Throat: Oropharynx is clear and moist.  Eyes: EOM are normal. Pupils are equal, round, and reactive to light.  Neck: Normal range of motion. Neck supple.  Cardiovascular: Normal rate, regular rhythm and normal heart sounds.   No murmur heard. Pulmonary/Chest: Effort normal and breath sounds normal. He has no wheezes.  Abdominal: Soft. Bowel sounds are normal. He exhibits no mass.  Genitourinary: Rectum normal. No penile tenderness.  Musculoskeletal: Normal range of motion. He exhibits no edema.  Lymphadenopathy:    He has no cervical adenopathy.  Neurological: He is alert and oriented to person, place, and time. No cranial nerve deficit.  Skin: Skin is warm and dry.  Psychiatric: He has a normal mood and affect. His behavior is normal.          Assessment & Plan:

## 2012-01-24 NOTE — Patient Instructions (Addendum)
Please complete the following lab tests before your next follow up appointment: CPX labs including PSA - V76.44 

## 2012-09-18 ENCOUNTER — Ambulatory Visit: Payer: Managed Care, Other (non HMO) | Admitting: Internal Medicine

## 2013-01-11 ENCOUNTER — Ambulatory Visit (INDEPENDENT_AMBULATORY_CARE_PROVIDER_SITE_OTHER): Payer: Managed Care, Other (non HMO) | Admitting: Family Medicine

## 2013-01-11 ENCOUNTER — Encounter: Payer: Self-pay | Admitting: Family Medicine

## 2013-01-11 VITALS — BP 122/80 | Temp 98.1°F | Wt 177.0 lb

## 2013-01-11 DIAGNOSIS — M542 Cervicalgia: Secondary | ICD-10-CM

## 2013-01-11 DIAGNOSIS — Z23 Encounter for immunization: Secondary | ICD-10-CM

## 2013-01-11 MED ORDER — CYCLOBENZAPRINE HCL 5 MG PO TABS: 5.0000 mg | ORAL_TABLET | Freq: Three times a day (TID) | ORAL | Status: DC | PRN

## 2013-01-11 NOTE — Progress Notes (Signed)
Chief Complaint  Patient presents with  . Fall    pt fell off ladder; neck pain; hurts to bend head forward.     HPI:  Richard Middleton is a 67 yo M patient of Dr. Artist Pais here for an acute visit for:  1) neck pain: -slipped on ladder 3 days ago and fell backwards and landed on back on grass, did not hit head, no LOC, kept working and felt fine initially -now has some mild to moderate (5/10 at worst) pain L sided neck pain worse with with turning head to L and bending neck -denies: radiation, weakness or numbness in UE -has used ice packs, biofreeze and massage which helps some  ROS: See pertinent positives and negatives per HPI.  Past Medical History  Diagnosis Date  . History of small bowel obstruction     x2  . Abnormal glucose     Past Surgical History  Procedure Laterality Date  . Resection small bowel / closure ileostomy      No family history on file.  History   Social History  . Marital Status: Married    Spouse Name: N/A    Number of Children: N/A  . Years of Education: N/A   Social History Main Topics  . Smoking status: Former Games developer  . Smokeless tobacco: None  . Alcohol Use: No  . Drug Use: No  . Sexual Activity: None   Other Topics Concern  . None   Social History Narrative  . None    Current outpatient prescriptions:cyclobenzaprine (FLEXERIL) 5 MG tablet, Take 1 tablet (5 mg total) by mouth 3 (three) times daily as needed for muscle spasms., Disp: 10 tablet, Rfl: 0  EXAM:  Filed Vitals:   01/11/13 0837  BP: 122/80  Temp: 98.1 F (36.7 C)    Body mass index is 24 kg/(m^2).  GENERAL: vitals reviewed and listed above, alert, oriented, appears well hydrated and in no acute distress  HEENT: atraumatic, conjunttiva clear, no obvious abnormalities on inspection of external nose and ears  NECK: no obvious masses on inspection, normal ROM,   LUNGS: clear to auscultation bilaterally, no wheezes, rales or rhonchi, good air movement  CV: HRRR, no  carotid bruits, no peripheral edema  MS/NEURO: moves all extremities without noticeable abnormality Normal ROM head and neck, normal strength head and neck and UE bilat, no bony TTP or observable abnormalities Neg spurlings TTP L sup occ muscles and trap L, otherwise no TTP CN II-XII grosly intact, normal sensation and NV intact bilat UE  PSYCH: pleasant and cooperative, no obvious depression or anxiety  ASSESSMENT AND PLAN:  Discussed the following assessment and plan:  Need for prophylactic vaccination and inoculation against influenza - Plan: Flu Vaccine QUAD 36+ mos PF IM (Fluarix)  Neck pain on left side - Plan: cyclobenzaprine (FLEXERIL) 5 MG tablet  -no signs or symptoms to suggest serious or bony injury - likely neck strain -offered imaging given hx trauma but he wishes to hold off on this -supportive care, HEP, follow up with PCP and return precuations -Patient advised to return or notify a doctor immediately if symptoms worsen or persist or new concerns arise.  Patient Instructions  -heat for 15 minutes twice daily  -biofreeze or capsacin topical sports creams if help, tylenol 500-1000mg  or ibuprofen 400mg  2 times daily as needed  -fexeril nightly for next several nights  -exercise provided  -follow up with your doctor as scheduled if any worsening, change of symptoms or concerns  Colin Benton R.

## 2013-01-11 NOTE — Patient Instructions (Signed)
-  heat for 15 minutes twice daily  -biofreeze or capsacin topical sports creams if help, tylenol 500-1000mg  or ibuprofen 400mg  2 times daily as needed  -fexeril nightly for next several nights  -exercise provided  -follow up with your doctor as scheduled if any worsening, change of symptoms or concerns

## 2013-02-01 ENCOUNTER — Other Ambulatory Visit (INDEPENDENT_AMBULATORY_CARE_PROVIDER_SITE_OTHER): Payer: Managed Care, Other (non HMO)

## 2013-02-01 DIAGNOSIS — Z0000 Encounter for general adult medical examination without abnormal findings: Secondary | ICD-10-CM

## 2013-02-01 LAB — CBC WITH DIFFERENTIAL/PLATELET
Eosinophils Relative: 1.7 % (ref 0.0–5.0)
HCT: 40.4 % (ref 39.0–52.0)
Hemoglobin: 13.4 g/dL (ref 13.0–17.0)
Lymphs Abs: 1.3 10*3/uL (ref 0.7–4.0)
Monocytes Relative: 6 % (ref 3.0–12.0)
Platelets: 213 10*3/uL (ref 150.0–400.0)
WBC: 4.9 10*3/uL (ref 4.5–10.5)

## 2013-02-01 LAB — HEPATIC FUNCTION PANEL
AST: 26 U/L (ref 0–37)
Albumin: 3.8 g/dL (ref 3.5–5.2)
Alkaline Phosphatase: 84 U/L (ref 39–117)
Total Protein: 7 g/dL (ref 6.0–8.3)

## 2013-02-01 LAB — POCT URINALYSIS DIPSTICK
Blood, UA: NEGATIVE
Ketones, UA: NEGATIVE
Nitrite, UA: NEGATIVE
Protein, UA: NEGATIVE
Spec Grav, UA: 1.025
pH, UA: 5.5

## 2013-02-01 LAB — LIPID PANEL
LDL Cholesterol: 67 mg/dL (ref 0–99)
Total CHOL/HDL Ratio: 2
VLDL: 11.8 mg/dL (ref 0.0–40.0)

## 2013-02-01 LAB — BASIC METABOLIC PANEL
GFR: 94.92 mL/min (ref >60.00)
Potassium: 4.6 mEq/L (ref 3.5–5.1)
Sodium: 140 mEq/L (ref 135–145)

## 2013-02-05 ENCOUNTER — Encounter: Payer: Managed Care, Other (non HMO) | Admitting: Internal Medicine

## 2013-02-05 NOTE — Progress Notes (Signed)
Quick Note:  Left a message for return call. ______ 

## 2013-02-08 ENCOUNTER — Encounter: Payer: Self-pay | Admitting: *Deleted

## 2013-02-18 ENCOUNTER — Encounter: Payer: Managed Care, Other (non HMO) | Admitting: Internal Medicine

## 2013-02-19 ENCOUNTER — Ambulatory Visit (INDEPENDENT_AMBULATORY_CARE_PROVIDER_SITE_OTHER): Payer: Managed Care, Other (non HMO) | Admitting: Internal Medicine

## 2013-02-19 ENCOUNTER — Encounter: Payer: Self-pay | Admitting: Internal Medicine

## 2013-02-19 VITALS — BP 128/80 | HR 72 | Temp 98.0°F | Ht 72.0 in | Wt 179.0 lb

## 2013-02-19 DIAGNOSIS — Z0000 Encounter for general adult medical examination without abnormal findings: Secondary | ICD-10-CM

## 2013-02-19 NOTE — Progress Notes (Signed)
Pre-visit discussion using our clinic review tool. No additional management support is needed unless otherwise documented below in the visit note.  

## 2013-02-19 NOTE — Progress Notes (Signed)
Subjective:    Patient ID: Richard Middleton, male    DOB: 20-Jun-1945, 67 y.o.   MRN: [id number]  HPI  67 year old African American male with history of small bowel obstruction for routine physical. He denies any significant interval medical history. He is using a nutritional supplement recommended by his coworkers.  He exercises on a regular basis. He denies any exercise intolerance. Patient states fairly active.  He denies any family history of prostate cancer. He is not having any prostate symptoms.   Review of Systems Past Medical History  Diagnosis Date  . History of small bowel obstruction     x2  . Abnormal glucose     History   Social History  . Marital Status: Married    Spouse Name: N/A    Number of Children: N/A  . Years of Education: N/A   Occupational History  . Not on file.   Social History Main Topics  . Smoking status: Former Games developer  . Smokeless tobacco: Not on file  . Alcohol Use: No  . Drug Use: No  . Sexual Activity: Not on file   Other Topics Concern  . Not on file   Social History Narrative  . No narrative on file    Past Surgical History  Procedure Laterality Date  . Resection small bowel / closure ileostomy      No family history on file.  No Known Allergies  No current outpatient prescriptions on file prior to visit.   No current facility-administered medications on file prior to visit.    BP 128/80  Pulse 72  Temp(Src) 98 F (36.7 C) (Oral)  Ht 6' (1.829 m)  Wt 179 lb (81.194 kg)  BMI 24.27 kg/m2       Past Medical History  Diagnosis Date  . History of small bowel obstruction     x2  . Abnormal glucose     History   Social History  . Marital Status: Married    Spouse Name: N/A    Number of Children: N/A  . Years of Education: N/A   Occupational History  . Not on file.   Social History Main Topics  . Smoking status: Former Games developer  . Smokeless tobacco: Not on file  . Alcohol Use: No  . Drug Use: No  .  Sexual Activity: Not on file   Other Topics Concern  . Not on file   Social History Narrative  . No narrative on file    Past Surgical History  Procedure Laterality Date  . Resection small bowel / closure ileostomy      No family history on file.  No Known Allergies  No current outpatient prescriptions on file prior to visit.   No current facility-administered medications on file prior to visit.    BP 128/80  Pulse 72  Temp(Src) 98 F (36.7 C) (Oral)  Ht 6' (1.829 m)  Wt 179 lb (81.194 kg)  BMI 24.27 kg/m2    Objective:   Physical Exam  Constitutional: He is oriented to person, place, and time. He appears well-developed and well-nourished. No distress.  HENT:  Head: Normocephalic and atraumatic.  Right Ear: External ear normal.  Left Ear: External ear normal.  Mouth/Throat: Oropharynx is clear and moist.  Good dentition  Eyes: EOM are normal. Pupils are equal, round, and reactive to light.  Neck: Neck supple.  No carotid bruit  Cardiovascular: Normal rate, regular rhythm, normal heart sounds and intact distal pulses.   No murmur heard. Pulmonary/Chest:  Effort normal and breath sounds normal. He has no wheezes.  Abdominal: Soft. Bowel sounds are normal. There is no tenderness.  Genitourinary: Rectum normal and prostate normal.  Musculoskeletal: He exhibits no edema.  Lymphadenopathy:    He has no cervical adenopathy.  Neurological: He is alert and oriented to person, place, and time. No cranial nerve deficit.  Skin: Skin is warm and dry.  Psychiatric: He has a normal mood and affect. His behavior is normal.          Assessment & Plan:

## 2013-02-19 NOTE — Assessment & Plan Note (Signed)
Reviewed adult health maintenance protocols.  Patient advised to discontinue dietary supplement. Patient encouraged to increase intake of fresh fruits and vegetables and hydrate with water. Patient's PSA within normal range and digital rectal exam unremarkable.

## 2013-02-19 NOTE — Patient Instructions (Signed)
Please complete the following lab tests before your next follow up appointment: CPX labs including PSA - V76.44

## 2013-04-19 ENCOUNTER — Encounter: Payer: Self-pay | Admitting: Internal Medicine

## 2013-04-19 ENCOUNTER — Ambulatory Visit (INDEPENDENT_AMBULATORY_CARE_PROVIDER_SITE_OTHER): Payer: Managed Care, Other (non HMO) | Admitting: Internal Medicine

## 2013-04-19 VITALS — BP 134/82 | Temp 98.0°F | Ht 72.0 in | Wt 182.0 lb

## 2013-04-19 DIAGNOSIS — M778 Other enthesopathies, not elsewhere classified: Secondary | ICD-10-CM

## 2013-04-19 DIAGNOSIS — M65849 Other synovitis and tenosynovitis, unspecified hand: Secondary | ICD-10-CM

## 2013-04-19 DIAGNOSIS — M779 Enthesopathy, unspecified: Principal | ICD-10-CM

## 2013-04-19 DIAGNOSIS — M65839 Other synovitis and tenosynovitis, unspecified forearm: Secondary | ICD-10-CM

## 2013-04-19 NOTE — Assessment & Plan Note (Signed)
Patient had a work-related injury resulting in right hand/forearm pain and tenderness. His right hand symptoms resolved with rest.  He has mild residual forearm tenderness to suspect is related to forearm tendinitis. We discussed avoiding forearm flexor strain for the next 2-4 weeks.  Use warm compress before activity.  Patient advised to call office if symptoms persist or worsen.

## 2013-04-19 NOTE — Progress Notes (Signed)
   Subjective:    Patient ID: Richard Middleton, male    DOB: [birth year], 68 y.o.   MRN: [id number]  HPI  68 year old African American male complains of work-related right forearm injury. Patient was pulling off tape on large item. Patient reports using a significant amount of force and he experienced transient sharp bolt-like sensation in his right hand and forearm. Over the next several weeks he applied ice and avoided strenuous activity. He also used wrist brace.  His hand symptoms have completely resolved. He has mild residualdiscomfort in the mid right forearm.   Review of Systems Negative for redness or swelling.  Past Medical History  Diagnosis Date  . History of small bowel obstruction     x2  . Abnormal glucose     History   Social History  . Marital Status: Married    Spouse Name: N/A    Number of Children: N/A  . Years of Education: N/A   Occupational History  . Not on file.   Social History Main Topics  . Smoking status: Former Research scientist (life sciences)  . Smokeless tobacco: Not on file  . Alcohol Use: No  . Drug Use: No  . Sexual Activity: Not on file   Other Topics Concern  . Not on file   Social History Narrative  . No narrative on file    Past Surgical History  Procedure Laterality Date  . Resection small bowel / closure ileostomy      No family history on file.  No Known Allergies  No current outpatient prescriptions on file prior to visit.   No current facility-administered medications on file prior to visit.    BP 134/82  Temp(Src) 98 F (36.7 C) (Oral)  Ht 6' (1.829 m)  Wt 182 lb (82.555 kg)  BMI 24.68 kg/m2       Objective:   Physical Exam  Constitutional: He appears well-developed and well-nourished. No distress.  Cardiovascular: Normal rate, regular rhythm and normal heart sounds.   No murmur heard. Pulmonary/Chest: Effort normal and breath sounds normal. He has no wheezes.  Musculoskeletal:  Right hand / range of motion normal.  Mild  tenderness of right mid forearm.          Assessment & Plan:

## 2013-04-19 NOTE — Patient Instructions (Signed)
Limit your activities with right forearm as directed. You can resume your usual activities within 4 weeks. Patient advised to call office if symptoms persist or worsen.

## 2013-04-19 NOTE — Progress Notes (Signed)
Pre visit review using our clinic review tool, if applicable. No additional management support is needed unless otherwise documented below in the visit note. 

## 2014-06-20 ENCOUNTER — Emergency Department (HOSPITAL_COMMUNITY): Payer: Commercial Managed Care - HMO

## 2014-06-20 ENCOUNTER — Emergency Department (HOSPITAL_COMMUNITY)
Admission: EM | Admit: 2014-06-20 | Discharge: 2014-06-20 | Disposition: A | Discharge status: Home / Self Care | Payer: Commercial Managed Care - HMO | Attending: Emergency Medicine | Admitting: Emergency Medicine

## 2014-06-20 ENCOUNTER — Telehealth: Payer: Self-pay | Admitting: *Deleted

## 2014-06-20 ENCOUNTER — Encounter (HOSPITAL_COMMUNITY): Payer: Self-pay | Admitting: Emergency Medicine

## 2014-06-20 DIAGNOSIS — R197 Diarrhea, unspecified: Secondary | ICD-10-CM | POA: Diagnosis not present

## 2014-06-20 DIAGNOSIS — Z79899 Other long term (current) drug therapy: Secondary | ICD-10-CM | POA: Diagnosis not present

## 2014-06-20 DIAGNOSIS — K529 Noninfective gastroenteritis and colitis, unspecified: Secondary | ICD-10-CM | POA: Diagnosis not present

## 2014-06-20 DIAGNOSIS — Z87891 Personal history of nicotine dependence: Secondary | ICD-10-CM | POA: Insufficient documentation

## 2014-06-20 DIAGNOSIS — R109 Unspecified abdominal pain: Secondary | ICD-10-CM | POA: Diagnosis not present

## 2014-06-20 DIAGNOSIS — R112 Nausea with vomiting, unspecified: Secondary | ICD-10-CM | POA: Diagnosis not present

## 2014-06-20 DIAGNOSIS — R1110 Vomiting, unspecified: Secondary | ICD-10-CM | POA: Diagnosis present

## 2014-06-20 LAB — CBC WITH DIFFERENTIAL/PLATELET
BASOS ABS: 0 10*3/uL (ref 0.0–0.1)
BASOS PCT: 0 % (ref 0–1)
EOS ABS: 0 10*3/uL (ref 0.0–0.7)
EOS PCT: 0 % (ref 0–5)
HEMATOCRIT: 48.4 % (ref 39.0–52.0)
HEMOGLOBIN: 15.8 g/dL (ref 13.0–17.0)
Lymphocytes Relative: 4 % — ABNORMAL LOW (ref 12–46)
Lymphs Abs: 0.3 10*3/uL — ABNORMAL LOW (ref 0.7–4.0)
MCH: 27.3 pg (ref 26.0–34.0)
MCHC: 32.6 g/dL (ref 30.0–36.0)
MCV: 83.7 fL (ref 78.0–100.0)
Monocytes Absolute: 0.2 10*3/uL (ref 0.1–1.0)
Monocytes Relative: 2 % — ABNORMAL LOW (ref 3–12)
NEUTROS PCT: 94 % — AB (ref 43–77)
Neutro Abs: 7.7 10*3/uL (ref 1.7–7.7)
Platelets: 185 10*3/uL (ref 150–400)
RBC: 5.78 MIL/uL (ref 4.22–5.81)
RDW: 13.6 % (ref 11.5–15.5)
WBC: 8.2 10*3/uL (ref 4.0–10.5)

## 2014-06-20 LAB — URINALYSIS, ROUTINE W REFLEX MICROSCOPIC
BILIRUBIN URINE: NEGATIVE
Glucose, UA: NEGATIVE mg/dL
HGB URINE DIPSTICK: NEGATIVE
Ketones, ur: NEGATIVE mg/dL
LEUKOCYTES UA: NEGATIVE
NITRITE: NEGATIVE
Protein, ur: NEGATIVE mg/dL
SPECIFIC GRAVITY, URINE: 1.027 (ref 1.005–1.030)
UROBILINOGEN UA: 0.2 mg/dL (ref 0.0–1.0)
pH: 6 (ref 5.0–8.0)

## 2014-06-20 LAB — COMPREHENSIVE METABOLIC PANEL
ALT: 40 U/L (ref 0–53)
AST: 42 U/L — ABNORMAL HIGH (ref 0–37)
Albumin: 4.6 g/dL (ref 3.5–5.2)
Alkaline Phosphatase: 103 U/L (ref 39–117)
Anion gap: 8 (ref 5–15)
BILIRUBIN TOTAL: 1.3 mg/dL — AB (ref 0.3–1.2)
BUN: 15 mg/dL (ref 6–23)
CALCIUM: 9.6 mg/dL (ref 8.4–10.5)
CO2: 21 mmol/L (ref 19–32)
CREATININE: 1.14 mg/dL (ref 0.50–1.35)
Chloride: 108 mmol/L (ref 96–112)
GFR calc Af Amer: 75 mL/min — ABNORMAL LOW (ref >90)
GFR calc non Af Amer: 65 mL/min — ABNORMAL LOW (ref >90)
GLUCOSE: 123 mg/dL — AB (ref 70–99)
Potassium: 4.4 mmol/L (ref 3.5–5.1)
Sodium: 137 mmol/L (ref 135–145)
Total Protein: 8.4 g/dL — ABNORMAL HIGH (ref 6.0–8.3)

## 2014-06-20 LAB — LIPASE, BLOOD: Lipase: 30 U/L (ref 11–59)

## 2014-06-20 MED ORDER — ONDANSETRON 4 MG PO TBDP: ORAL_TABLET | ORAL | Status: DC

## 2014-06-20 MED ORDER — SODIUM CHLORIDE 0.9 % IV BOLUS (SEPSIS)
1000.0000 mL | Freq: Once | INTRAVENOUS | Status: AC
  Administered 2014-06-20: 1000 mL via INTRAVENOUS

## 2014-06-20 MED ORDER — FAMOTIDINE IN NACL 20-0.9 MG/50ML-% IV SOLN
20.0000 mg | Freq: Once | INTRAVENOUS | Status: AC
  Administered 2014-06-20: 20 mg via INTRAVENOUS
  Filled 2014-06-20: qty 50

## 2014-06-20 MED ORDER — FAMOTIDINE 20 MG PO TABS: 20.0000 mg | ORAL_TABLET | Freq: Two times a day (BID) | ORAL | Status: DC

## 2014-06-20 NOTE — Discharge Instructions (Signed)

## 2014-06-20 NOTE — ED Notes (Signed)
Patient transported to X-ray 

## 2014-06-20 NOTE — ED Provider Notes (Signed)
CSN: [id number]     Arrival date & time 06/20/14  1610 History   First MD Initiated Contact with Patient 06/20/14 1655     Chief Complaint  Patient presents with  . vomiting/diarrhea      (Consider location/radiation/quality/duration/timing/severity/associated sxs/prior Treatment) HPI The patient reports that he woke up at 4 in the morning and began having diarrheal bowel movements. He reports he was having a bowel movement about every 15-30 minutes for the first 2 hours and then started having vomiting as well. He has had an estimated 8 episodes of diarrhea and about the same with vomiting. He reports prior to vomiting he would get a cramping uncomfortable pain in his epigastric area. Once he had vomited he reports that the pain would temporarily resolved. He did begin to feel lightheaded with position changes but has not had a syncopal episode. He has not seen any blood in his emesis or diarrhea. The patient reports he has a distant history of a small bowel obstruction that was surgically treated. As he describes it, but this was an anatomical problem and the patient did not have any cancer or other complicating factor. He has had no problems since then. He was seen by his PCP today and reports that he was told to come to emergency department for possible electrolyte imbalance. Family members and he had eaten the same meals yesterday. No one else is ill. He does however report that he was at church yesterday evening and he had had contact with multiple individuals in that social setting. Past Medical History  Diagnosis Date  . History of small bowel obstruction     x2  . Abnormal glucose    Past Surgical History  Procedure Laterality Date  . Resection small bowel / closure ileostomy     No family history on file. History  Substance Use Topics  . Smoking status: Former Research scientist (life sciences)  . Smokeless tobacco: Not on file  . Alcohol Use: No    Review of Systems 10 Systems reviewed and are negative  for acute change except as noted in the HPI.    Allergies  Review of patient's allergies indicates no known allergies.  Home Medications   Prior to Admission medications   Medication Sig Start Date End Date Taking? Authorizing Provider  VITAMIN D, CHOLECALCIFEROL, PO Take 1 capsule by mouth daily.   Yes Historical Provider, MD  famotidine (PEPCID) 20 MG tablet Take 1 tablet (20 mg total) by mouth 2 (two) times daily. 06/20/14   Charlesetta Shanks, MD  ondansetron (ZOFRAN ODT) 4 MG disintegrating tablet 4mg  ODT q4 hours prn nausea/vomit 06/20/14   Charlesetta Shanks, MD   BP 114/63 mmHg  Pulse 73  Temp(Src) 97.7 F (36.5 C) (Oral)  Resp 18  SpO2 100% Physical Exam  Constitutional: He is oriented to person, place, and time. He appears well-developed and well-nourished.  HENT:  Head: Normocephalic and atraumatic.  Eyes: EOM are normal. Pupils are equal, round, and reactive to light.  Neck: Neck supple.  Cardiovascular: Normal rate, regular rhythm, normal heart sounds and intact distal pulses.   Pulmonary/Chest: Effort normal and breath sounds normal.  Abdominal: Soft. Bowel sounds are normal. He exhibits no distension. There is no tenderness.  Musculoskeletal: Normal range of motion. He exhibits no edema.  Neurological: He is alert and oriented to person, place, and time. He has normal strength. Coordination normal. GCS eye subscore is 4. GCS verbal subscore is 5. GCS motor subscore is 6.  Skin: Skin is warm,  dry and intact.  Psychiatric: He has a normal mood and affect.    ED Course  Procedures (including critical care time) Labs Review Labs Reviewed  CBC WITH DIFFERENTIAL/PLATELET - Abnormal; Notable for the following:    Neutrophils Relative % 94 (*)    Lymphocytes Relative 4 (*)    Lymphs Abs 0.3 (*)    Monocytes Relative 2 (*)    All other components within normal limits  COMPREHENSIVE METABOLIC PANEL - Abnormal; Notable for the following:    Glucose, Bld 123 (*)    Total  Protein 8.4 (*)    AST 42 (*)    Total Bilirubin 1.3 (*)    GFR calc non Af Amer 65 (*)    GFR calc Af Amer 75 (*)    All other components within normal limits  URINALYSIS, ROUTINE W REFLEX MICROSCOPIC - Abnormal; Notable for the following:    Color, Urine AMBER (*)    All other components within normal limits  LIPASE, BLOOD    Imaging Review Dg Abd Acute W/chest  06/20/2014   CLINICAL DATA:  Nausea vomiting and diarrhea beginning this morning. Abdominal pain.  EXAM: ACUTE ABDOMEN SERIES (ABDOMEN 2 VIEW & CHEST 1 VIEW)  COMPARISON:  PA and lateral chest 05/18/2009. Single view of the abdomen 05/03/2008.  FINDINGS: Single view of the chest demonstrates clear lungs and normal heart size. No pneumothorax or pleural effusion.  Two views of the abdomen show no free intraperitoneal air. The bowel gas pattern is unremarkable. Convex left scoliosis is noted.  IMPRESSION: No acute finding chest or abdomen.   Electronically Signed   By: Inge Rise M.D.   On: 06/20/2014 17:54     EKG Interpretation None     19:01 recheck. The patient feels improved. He has had no further vomiting or diarrhea. Vital signs are stable and appearance is alert and appropriate without acute distress. MDM   Final diagnoses:  Gastroenteritis   History is consistent with a gastroenteritis. Most likely etiology is viral. Other family members who've eaten the same foods have not become ill. The patient did not have any further episodes of vomiting or diarrhea while in the emergency department. He has been rehydrated and there is no significant electrolyte derangement. The patient is not orthostatic. He has no further abdominal pain. The acute abdominal series is nonacute. I doubt any surgical etiology such as early bowel obstruction based on the symptoms and the patient's condition. The patient was instructed to return if he is having any return of symptoms, worsening symptoms or other concerns.    Charlesetta Shanks,  MD 06/20/14 2014

## 2014-06-20 NOTE — Telephone Encounter (Signed)
Patient walked in with complaints of diarrhea, nausea, fainting episode x1, hard to breath, and chest hurting. Upon assessment, patient appeared slightly pallor, weak, and increased respirations. BP 100/70, HR 82, O2 99, RR 28, temp 99.1. Patient stated episodes began around 0400 this morning. During visit patient did vomit x1; green bile appearance. MD Shawna Orleans assessed patient and mutually recommended patient be seen by ED. Patient verbalized understanding and he and mother-in-law (at bedside) will be going to [facility] ED. Called the ED to make aware of him coming.

## 2014-06-20 NOTE — ED Notes (Signed)
Per pt, states abdominal pain, vomiting and diarrhea-saw PCP today and was told to come here for possible electrolyte imbalance

## 2014-06-20 NOTE — ED Notes (Signed)
Bed: WA17 Expected date:  Expected time:  Means of arrival:  Comments: Hold for triage 2 

## 2014-06-24 ENCOUNTER — Other Ambulatory Visit: Payer: Managed Care, Other (non HMO)

## 2014-06-27 ENCOUNTER — Other Ambulatory Visit (INDEPENDENT_AMBULATORY_CARE_PROVIDER_SITE_OTHER): Payer: Commercial Managed Care - HMO

## 2014-06-27 DIAGNOSIS — Z0000 Encounter for general adult medical examination without abnormal findings: Secondary | ICD-10-CM | POA: Diagnosis not present

## 2014-06-27 LAB — CBC WITH DIFFERENTIAL/PLATELET
Basophils Absolute: 0 10*3/uL (ref 0.0–0.1)
Basophils Relative: 0.3 % (ref 0.0–3.0)
EOS PCT: 1.6 % (ref 0.0–5.0)
Eosinophils Absolute: 0.1 10*3/uL (ref 0.0–0.7)
HEMATOCRIT: 40.8 % (ref 39.0–52.0)
HEMOGLOBIN: 13.7 g/dL (ref 13.0–17.0)
LYMPHS ABS: 1.5 10*3/uL (ref 0.7–4.0)
Lymphocytes Relative: 28.9 % (ref 12.0–46.0)
MCHC: 33.5 g/dL (ref 30.0–36.0)
MCV: 80.5 fl (ref 78.0–100.0)
Monocytes Absolute: 0.3 10*3/uL (ref 0.1–1.0)
Monocytes Relative: 5.1 % (ref 3.0–12.0)
NEUTROS ABS: 3.3 10*3/uL (ref 1.4–7.7)
Neutrophils Relative %: 64.1 % (ref 43.0–77.0)
Platelets: 234 10*3/uL (ref 150.0–400.0)
RBC: 5.07 Mil/uL (ref 4.22–5.81)
RDW: 14 % (ref 11.5–15.5)
WBC: 5.2 10*3/uL (ref 4.0–10.5)

## 2014-06-27 LAB — POCT URINALYSIS DIPSTICK
Bilirubin, UA: NEGATIVE
GLUCOSE UA: NEGATIVE
Ketones, UA: NEGATIVE
Leukocytes, UA: NEGATIVE
Nitrite, UA: NEGATIVE
PH UA: 5.5
Protein, UA: NEGATIVE
RBC UA: NEGATIVE
Spec Grav, UA: 1.015
UROBILINOGEN UA: 0.2

## 2014-06-27 LAB — COMPREHENSIVE METABOLIC PANEL
ALBUMIN: 4 g/dL (ref 3.5–5.2)
ALK PHOS: 81 U/L (ref 39–117)
ALT: 32 U/L (ref 0–53)
AST: 28 U/L (ref 0–37)
BILIRUBIN TOTAL: 0.7 mg/dL (ref 0.2–1.2)
BUN: 14 mg/dL (ref 6–23)
CO2: 29 mEq/L (ref 19–32)
Calcium: 9.4 mg/dL (ref 8.4–10.5)
Chloride: 105 mEq/L (ref 96–112)
Creatinine, Ser: 1.14 mg/dL (ref 0.40–1.50)
GFR: 82.2 mL/min (ref >60.00)
GLUCOSE: 86 mg/dL (ref 70–99)
POTASSIUM: 4.3 meq/L (ref 3.5–5.1)
Sodium: 139 mEq/L (ref 135–145)
Total Protein: 6.8 g/dL (ref 6.0–8.3)

## 2014-06-27 LAB — TSH: TSH: 1.43 u[IU]/mL (ref 0.35–4.50)

## 2014-06-27 LAB — LIPID PANEL
Cholesterol: 124 mg/dL (ref 0–200)
HDL: 50 mg/dL (ref >39.00)
LDL CALC: 60 mg/dL (ref 0–99)
NONHDL: 74
TRIGLYCERIDES: 68 mg/dL (ref 0.0–149.0)
Total CHOL/HDL Ratio: 2
VLDL: 13.6 mg/dL (ref 0.0–40.0)

## 2014-06-27 LAB — PSA: PSA: 0.4 ng/mL (ref 0.10–4.00)

## 2014-07-01 ENCOUNTER — Encounter: Payer: Self-pay | Admitting: Internal Medicine

## 2014-07-01 ENCOUNTER — Ambulatory Visit (INDEPENDENT_AMBULATORY_CARE_PROVIDER_SITE_OTHER): Payer: Commercial Managed Care - HMO | Admitting: Internal Medicine

## 2014-07-01 VITALS — BP 114/66 | HR 65 | Temp 97.7°F | Ht 72.0 in | Wt 176.0 lb

## 2014-07-01 DIAGNOSIS — Z0000 Encounter for general adult medical examination without abnormal findings: Secondary | ICD-10-CM | POA: Diagnosis not present

## 2014-07-01 NOTE — Progress Notes (Signed)
Pre visit review using our clinic review tool, if applicable. No additional management support is needed unless otherwise documented below in the visit note. 

## 2014-07-01 NOTE — Progress Notes (Signed)
   Subjective:    Patient ID: Richard Middleton, male    DOB: Sep 21, 1945, 69 y.o.   MRN: [id number]  HPI  69 year old African American male presents for Medicare wellness exam. Patient had emergency room evaluation last week for viral gastroenteritis. He was dehydrated and received IV fluids.  His last eye exam was in 2014. He does not have glaucoma. He does not wear corrective lenses. She has not had any surgeries or past year. Screening for substance abuse negative. He exercises on a regular basis. He is sexually active and he is monogamous. No difficulty with activities of daily living. He denies fear of falling or history of falls within the past year. He has no difficulty following conversation/hearing loss. He denies depressive symptoms. He denies memory loss.  Memory loss screening negative.  Review of Systems     Constitutional: Negative for activity change, appetite change and unexpected weight change.  Eyes: Negative for visual disturbance.  Respiratory: Negative for cough, chest tightness and shortness of breath.   Cardiovascular: Negative for chest pain.  Genitourinary: Negative for difficulty urinating.  Neurological: Negative for headaches.  Gastrointestinal: Negative for abdominal pain, heartburn melena or hematochezia Psych: Negative for depression or anxiety Endo:  No polyuria or polydypsia    Past Medical History  Diagnosis Date  . History of small bowel obstruction     x2  . Abnormal glucose     History   Social History  . Marital Status: Married    Spouse Name: N/A  . Number of Children: N/A  . Years of Education: N/A   Occupational History  . Not on file.   Social History Main Topics  . Smoking status: Former Research scientist (life sciences)  . Smokeless tobacco: Not on file  . Alcohol Use: No  . Drug Use: No  . Sexual Activity: Not on file   Other Topics Concern  . Not on file   Social History Narrative    Past Surgical History  Procedure Laterality Date  . Resection  small bowel / closure ileostomy      No family history on file.  No Known Allergies  Current Outpatient Prescriptions on File Prior to Visit  Medication Sig Dispense Refill  . VITAMIN D, CHOLECALCIFEROL, PO Take 1 capsule by mouth daily.     No current facility-administered medications on file prior to visit.    BP 114/66 mmHg  Pulse 65  Temp(Src) 97.7 F (36.5 C) (Oral)  Ht 6' (1.829 m)  Wt 176 lb (79.833 kg)  BMI 23.86 kg/m2      Objective:   Physical Exam  Constitutional: Appears well-developed and well-nourished. No distress.  Head: Normocephalic and atraumatic.  Ear:  Right and left ear normal.  TMs clear.  Hearing is grossly normal Mouth/Throat: Oropharynx is clear and moist.  Eyes: Conjunctivae are normal. Pupils are equal, round, and reactive to light.  Neck: Normal range of motion. Neck supple. No thyromegaly present. No carotid bruit Cardiovascular: Normal rate, regular rhythm and normal heart sounds.  Exam reveals no gallop and no friction rub.  No murmur heard. Pulmonary/Chest: Effort normal and breath sounds normal.  No wheezes. No rales.  Abdominal: Soft. Bowel sounds are normal. No mass. There is no tenderness.  GU: enlarged prostate w/o asymmetry or nodules, Guaiac-negative Neurological: Alert. No cranial nerve deficit.  Skin: Skin is warm and dry.  Psychiatric: Normal mood and affect. Behavior is normal.         Assessment & Plan:

## 2014-07-01 NOTE — Assessment & Plan Note (Signed)
Reviewed adult health maintenance protocols.  Medicare wellness questionnaire reviewed in detail. See attached form. Screening for substance abuse, falls, depression, memory loss negative.  Patient up dated with Prevnar.

## 2014-07-04 ENCOUNTER — Other Ambulatory Visit: Payer: Managed Care, Other (non HMO)

## 2014-07-08 ENCOUNTER — Encounter: Payer: Managed Care, Other (non HMO) | Admitting: Internal Medicine

## 2014-07-11 ENCOUNTER — Inpatient Hospital Stay (HOSPITAL_COMMUNITY)
Admission: EM | Admit: 2014-07-11 | Discharge: 2014-07-18 | DRG: 337 | Disposition: A | Discharge status: Home / Self Care | Payer: Commercial Managed Care - HMO | Attending: Surgery | Admitting: Surgery

## 2014-07-11 ENCOUNTER — Encounter (HOSPITAL_COMMUNITY): Payer: Self-pay | Admitting: Emergency Medicine

## 2014-07-11 ENCOUNTER — Emergency Department (HOSPITAL_COMMUNITY): Payer: Commercial Managed Care - HMO

## 2014-07-11 DIAGNOSIS — Z87891 Personal history of nicotine dependence: Secondary | ICD-10-CM

## 2014-07-11 DIAGNOSIS — K565 Intestinal adhesions [bands] with obstruction (postprocedural) (postinfection): Principal | ICD-10-CM | POA: Diagnosis present

## 2014-07-11 DIAGNOSIS — Z0189 Encounter for other specified special examinations: Secondary | ICD-10-CM

## 2014-07-11 DIAGNOSIS — R1032 Left lower quadrant pain: Secondary | ICD-10-CM | POA: Diagnosis not present

## 2014-07-11 DIAGNOSIS — K5660 Unspecified intestinal obstruction: Secondary | ICD-10-CM | POA: Diagnosis not present

## 2014-07-11 DIAGNOSIS — R040 Epistaxis: Secondary | ICD-10-CM | POA: Diagnosis not present

## 2014-07-11 DIAGNOSIS — K567 Ileus, unspecified: Secondary | ICD-10-CM | POA: Diagnosis not present

## 2014-07-11 DIAGNOSIS — R109 Unspecified abdominal pain: Secondary | ICD-10-CM

## 2014-07-11 DIAGNOSIS — K5669 Other intestinal obstruction: Secondary | ICD-10-CM | POA: Diagnosis not present

## 2014-07-11 DIAGNOSIS — Z79899 Other long term (current) drug therapy: Secondary | ICD-10-CM | POA: Diagnosis not present

## 2014-07-11 DIAGNOSIS — R1033 Periumbilical pain: Secondary | ICD-10-CM | POA: Diagnosis not present

## 2014-07-11 DIAGNOSIS — R1030 Lower abdominal pain, unspecified: Secondary | ICD-10-CM | POA: Diagnosis not present

## 2014-07-11 DIAGNOSIS — K56609 Unspecified intestinal obstruction, unspecified as to partial versus complete obstruction: Secondary | ICD-10-CM | POA: Diagnosis present

## 2014-07-11 DIAGNOSIS — Z9049 Acquired absence of other specified parts of digestive tract: Secondary | ICD-10-CM | POA: Diagnosis present

## 2014-07-11 LAB — LIPASE, BLOOD: LIPASE: 23 U/L (ref 11–59)

## 2014-07-11 LAB — COMPREHENSIVE METABOLIC PANEL
ALT: 26 U/L (ref 0–53)
AST: 28 U/L (ref 0–37)
Albumin: 4.2 g/dL (ref 3.5–5.2)
Alkaline Phosphatase: 96 U/L (ref 39–117)
Anion gap: 11 (ref 5–15)
BILIRUBIN TOTAL: 1 mg/dL (ref 0.3–1.2)
BUN: 15 mg/dL (ref 6–23)
CHLORIDE: 101 mmol/L (ref 96–112)
CO2: 25 mmol/L (ref 19–32)
CREATININE: 1.01 mg/dL (ref 0.50–1.35)
Calcium: 9.5 mg/dL (ref 8.4–10.5)
GFR calc Af Amer: 87 mL/min — ABNORMAL LOW (ref >90)
GFR, EST NON AFRICAN AMERICAN: 75 mL/min — AB (ref >90)
Glucose, Bld: 151 mg/dL — ABNORMAL HIGH (ref 70–99)
Potassium: 4.1 mmol/L (ref 3.5–5.1)
SODIUM: 137 mmol/L (ref 135–145)
Total Protein: 7.6 g/dL (ref 6.0–8.3)

## 2014-07-11 LAB — URINALYSIS, ROUTINE W REFLEX MICROSCOPIC
Bilirubin Urine: NEGATIVE
GLUCOSE, UA: NEGATIVE mg/dL
HGB URINE DIPSTICK: NEGATIVE
KETONES UR: 40 mg/dL — AB
Leukocytes, UA: NEGATIVE
Nitrite: NEGATIVE
PROTEIN: NEGATIVE mg/dL
Specific Gravity, Urine: 1.031 — ABNORMAL HIGH (ref 1.005–1.030)
Urobilinogen, UA: 0.2 mg/dL (ref 0.0–1.0)
pH: 7.5 (ref 5.0–8.0)

## 2014-07-11 LAB — CBC WITH DIFFERENTIAL/PLATELET
BASOS ABS: 0 10*3/uL (ref 0.0–0.1)
Basophils Relative: 0 % (ref 0–1)
EOS PCT: 0 % (ref 0–5)
Eosinophils Absolute: 0 10*3/uL (ref 0.0–0.7)
HEMATOCRIT: 42.9 % (ref 39.0–52.0)
HEMOGLOBIN: 13.8 g/dL (ref 13.0–17.0)
LYMPHS ABS: 0.7 10*3/uL (ref 0.7–4.0)
LYMPHS PCT: 10 % — AB (ref 12–46)
MCH: 26.9 pg (ref 26.0–34.0)
MCHC: 32.2 g/dL (ref 30.0–36.0)
MCV: 83.6 fL (ref 78.0–100.0)
MONO ABS: 0.2 10*3/uL (ref 0.1–1.0)
MONOS PCT: 3 % (ref 3–12)
Neutro Abs: 5.8 10*3/uL (ref 1.7–7.7)
Neutrophils Relative %: 87 % — ABNORMAL HIGH (ref 43–77)
Platelets: 195 10*3/uL (ref 150–400)
RBC: 5.13 MIL/uL (ref 4.22–5.81)
RDW: 13.3 % (ref 11.5–15.5)
WBC: 6.6 10*3/uL (ref 4.0–10.5)

## 2014-07-11 MED ORDER — LIDOCAINE VISCOUS 2 % MT SOLN
15.0000 mL | Freq: Once | OROMUCOSAL | Status: AC
  Administered 2014-07-11: 15 mL via OROMUCOSAL
  Filled 2014-07-11: qty 15

## 2014-07-11 MED ORDER — ONDANSETRON HCL 4 MG PO TABS: 4.0000 mg | ORAL_TABLET | Freq: Four times a day (QID) | ORAL | Status: DC | PRN

## 2014-07-11 MED ORDER — MORPHINE SULFATE 4 MG/ML IJ SOLN
4.0000 mg | Freq: Once | INTRAMUSCULAR | Status: AC
  Administered 2014-07-11: 4 mg via INTRAVENOUS
  Filled 2014-07-11: qty 1

## 2014-07-11 MED ORDER — ONDANSETRON HCL 4 MG/2ML IJ SOLN
4.0000 mg | Freq: Once | INTRAMUSCULAR | Status: AC
  Administered 2014-07-11: 4 mg via INTRAVENOUS
  Filled 2014-07-11: qty 2

## 2014-07-11 MED ORDER — ONDANSETRON HCL 4 MG/2ML IJ SOLN
4.0000 mg | Freq: Four times a day (QID) | INTRAMUSCULAR | Status: DC | PRN
  Administered 2014-07-11 – 2014-07-15 (×3): 4 mg via INTRAVENOUS
  Filled 2014-07-11 (×4): qty 2

## 2014-07-11 MED ORDER — SODIUM CHLORIDE 0.9 % IV BOLUS (SEPSIS)
1000.0000 mL | Freq: Once | INTRAVENOUS | Status: AC
  Administered 2014-07-11: 1000 mL via INTRAVENOUS

## 2014-07-11 MED ORDER — SODIUM CHLORIDE 0.9 % IV SOLN
INTRAVENOUS | Status: DC
  Administered 2014-07-11: 22:00:00 via INTRAVENOUS
  Administered 2014-07-12: 75 mL/h via INTRAVENOUS
  Administered 2014-07-13: via INTRAVENOUS

## 2014-07-11 MED ORDER — IOHEXOL 300 MG/ML  SOLN
50.0000 mL | Freq: Once | INTRAMUSCULAR | Status: AC | PRN
  Administered 2014-07-11: 50 mL via ORAL

## 2014-07-11 MED ORDER — IOHEXOL 300 MG/ML  SOLN
100.0000 mL | Freq: Once | INTRAMUSCULAR | Status: AC | PRN
  Administered 2014-07-11: 100 mL via INTRAVENOUS

## 2014-07-11 MED ORDER — MORPHINE SULFATE 2 MG/ML IJ SOLN
2.0000 mg | INTRAMUSCULAR | Status: DC | PRN
  Administered 2014-07-11 – 2014-07-15 (×11): 2 mg via INTRAVENOUS
  Filled 2014-07-11 (×12): qty 1

## 2014-07-11 MED ORDER — HEPARIN SODIUM (PORCINE) 5000 UNIT/ML IJ SOLN
5000.0000 [IU] | Freq: Three times a day (TID) | INTRAMUSCULAR | Status: DC
  Administered 2014-07-11 – 2014-07-17 (×14): 5000 [IU] via SUBCUTANEOUS
  Filled 2014-07-11 (×23): qty 1

## 2014-07-11 NOTE — Consult Note (Signed)
Reason for Consult: SBO Referring Physician: Elgergawey  Richard Middleton is an 69 y.o. male.   HPI: Patient is a pleasant 70 year old male who has a history of an initial presentation of small bowel obstruction without previous abdominal surgery in 2005. He underwent exploratory laparotomy at that time by me apparent findings of congenital adhesions per the patient although I do not specifically recall and do not have the operative report.  Since that timehe has had at least 3 subsequent admissions for small bowel obstruction, the last in 2012. Each time this has resolved quickly with NG suction and bowel rest without surgery. The patient has done well over the last several years but this morning at about 1:30, now almost 24 hours ago he had the fairly rapid onset of aching and crampy pain in his mid abdomen and radiating somewhat over to the right side. This was fairly severe and associated with nausea and vomiting. He presented to the emergency department where CT scan as below showed evidence of small bowel obstruction with questionable closed loop obstruction. The patient was admitted by the hospitalist service and NG tube placed. As I'm seeing him this evening he states he is definitely feeling better and currently denies any pain. He has had no flatus or bowel movement since onset of the pain.  Past Medical History  Diagnosis Date  . History of small bowel obstruction     x2  . Abnormal glucose     Past Surgical History  Procedure Laterality Date  . Resection small bowel / closure ileostomy      No family history on file.  Social History:  reports that he has quit smoking. He does not have any smokeless tobacco history on file. He reports that he does not drink alcohol or use illicit drugs.  Allergies: No Known Allergies  Current Facility-Administered Medications  Medication Dose Route Frequency Provider Last Rate Last Dose  . 0.9 %  sodium chloride infusion   Intravenous Continuous  Albertine Patricia, MD 75 mL/hr at 07/11/14 2146    . heparin injection 5,000 Units  5,000 Units Subcutaneous 3 times per day Albertine Patricia, MD      . morphine 2 MG/ML injection 2 mg  2 mg Intravenous Q4H PRN Albertine Patricia, MD   2 mg at 07/11/14 2154  . ondansetron (ZOFRAN) tablet 4 mg  4 mg Oral Q6H PRN Albertine Patricia, MD       Or  . ondansetron (ZOFRAN) injection 4 mg  4 mg Intravenous Q6H PRN Albertine Patricia, MD   4 mg at 07/11/14 2155     Results for orders placed or performed during the hospital encounter of 07/11/14 (from the past 48 hour(s))  Urinalysis, Routine w reflex microscopic     Status: Abnormal   Collection Time: 07/11/14 12:39 PM  Result Value Ref Range   Color, Urine YELLOW YELLOW   APPearance CLEAR CLEAR   Specific Gravity, Urine 1.031 (H) 1.005 - 1.030   pH 7.5 5.0 - 8.0   Glucose, UA NEGATIVE NEGATIVE mg/dL   Hgb urine dipstick NEGATIVE NEGATIVE   Bilirubin Urine NEGATIVE NEGATIVE   Ketones, ur 40 (A) NEGATIVE mg/dL   Protein, ur NEGATIVE NEGATIVE mg/dL   Urobilinogen, UA 0.2 0.0 - 1.0 mg/dL   Nitrite NEGATIVE NEGATIVE   Leukocytes, UA NEGATIVE NEGATIVE    Comment: MICROSCOPIC NOT DONE ON URINES WITH NEGATIVE PROTEIN, BLOOD, LEUKOCYTES, NITRITE, OR GLUCOSE <1000 mg/dL.  CBC with Differential  Status: Abnormal   Collection Time: 07/11/14  1:12 PM  Result Value Ref Range   WBC 6.6 4.0 - 10.5 K/uL   RBC 5.13 4.22 - 5.81 MIL/uL   Hemoglobin 13.8 13.0 - 17.0 g/dL   HCT 42.9 39.0 - 52.0 %   MCV 83.6 78.0 - 100.0 fL   MCH 26.9 26.0 - 34.0 pg   MCHC 32.2 30.0 - 36.0 g/dL   RDW 13.3 11.5 - 15.5 %   Platelets 195 150 - 400 K/uL   Neutrophils Relative % 87 (H) 43 - 77 %   Neutro Abs 5.8 1.7 - 7.7 K/uL   Lymphocytes Relative 10 (L) 12 - 46 %   Lymphs Abs 0.7 0.7 - 4.0 K/uL   Monocytes Relative 3 3 - 12 %   Monocytes Absolute 0.2 0.1 - 1.0 K/uL   Eosinophils Relative 0 0 - 5 %   Eosinophils Absolute 0.0 0.0 - 0.7 K/uL   Basophils Relative 0 0  - 1 %   Basophils Absolute 0.0 0.0 - 0.1 K/uL  Comprehensive metabolic panel     Status: Abnormal   Collection Time: 07/11/14  1:12 PM  Result Value Ref Range   Sodium 137 135 - 145 mmol/L   Potassium 4.1 3.5 - 5.1 mmol/L   Chloride 101 96 - 112 mmol/L   CO2 25 19 - 32 mmol/L   Glucose, Bld 151 (H) 70 - 99 mg/dL   BUN 15 6 - 23 mg/dL   Creatinine, Ser 1.01 0.50 - 1.35 mg/dL   Calcium 9.5 8.4 - 10.5 mg/dL   Total Protein 7.6 6.0 - 8.3 g/dL   Albumin 4.2 3.5 - 5.2 g/dL   AST 28 0 - 37 U/L   ALT 26 0 - 53 U/L   Alkaline Phosphatase 96 39 - 117 U/L   Total Bilirubin 1.0 0.3 - 1.2 mg/dL   GFR calc non Af Amer 75 (L) >90 mL/min   GFR calc Af Amer 87 (L) >90 mL/min    Comment: (NOTE) The eGFR has been calculated using the CKD EPI equation. This calculation has not been validated in all clinical situations. eGFR's persistently <90 mL/min signify possible Chronic Kidney Disease.    Anion gap 11 5 - 15  Lipase, blood     Status: None   Collection Time: 07/11/14  1:12 PM  Result Value Ref Range   Lipase 23 11 - 59 U/L    Ct Abdomen Pelvis W Contrast  07/11/2014   CLINICAL DATA:  Lower abdominal pain beginning this morning with nausea and vomiting. Personal history of small bowel obstruction.  EXAM: CT ABDOMEN AND PELVIS WITH CONTRAST  TECHNIQUE: Multidetector CT imaging of the abdomen and pelvis was performed using the standard protocol following bolus administration of intravenous contrast.  CONTRAST:  54m OMNIPAQUE IOHEXOL 300 MG/ML SOLN, 1071mOMNIPAQUE IOHEXOL 300 MG/ML SOLN  COMPARISON:  Radiography 06/20/2014.  CT abdomen 04/28/2008.  FINDINGS: Lung bases are clear. No pleural or pericardial fluid. The liver parenchyma shows multiple benign appearing cysts, which have grown since 2010. Index is cyst in the anterior segment right lobe previously measured 10 mm in diameter and now measures 20 mm in diameter. No lesion that does not look like a cyst. No calcified gallstones. The spleen is  normal. The pancreas is normal. The adrenal glands are normal. Kidneys are normal. No cyst, mass, stone or hydronephrosis. The aorta and IVC are normal except for some early atherosclerotic change of the aorta without aneurysm. There  are dilated fluid and air-filled loops of small intestine consistent with early/partial small bowel obstruction. Question internal hernia, as the segment of dilated small bowel appears to be segmental. The colon appears normal. No free fluid or air. Bladder appears normal. Ordinary degenerative changes affect the spine.  IMPRESSION: Early/partial small bowel obstruction which appears segmental, raising the possibility of internal hernia with closed loop obstruction.   Electronically Signed   By: Nelson Chimes M.D.   On: 07/11/2014 20:00    Review of Systems  Constitutional: Negative.   Respiratory: Negative.   Cardiovascular: Negative.   Gastrointestinal: Positive for nausea, vomiting and abdominal pain. Negative for diarrhea and constipation.  Genitourinary: Negative.    Blood pressure 152/80, pulse 60, temperature 98.4 F (36.9 C), temperature source Oral, resp. rate 16, height _0  (1.854 m), weight 80.287 kg (177 lb), SpO2 99 %. Physical Exam General: Alert, well-developed African-American male, in no distress Skin: Warm and dry without rash or infection. HEENT: No palpable masses or thyromegaly. Sclera nonicteric. Pupils equal round and reactive. Oropharynx clear. Lymph nodes: No cervical, supraclavicular, or inguinal nodes palpable.  Lungs: Breath sounds clear and equal without increased work of breathing Cardiovascular: Regular rate and rhythm without murmur. No JVD or edema. Peripheral pulses intact. Abdomen: Minimally if at all distended. Soft and nontender. No masses palpable. No organomegaly. No palpable hernias.  Well-healed midline incision Extremities: No edema or joint swelling or deformity. No chronic venous stasis changes. Neurologic: Alert and  fully oriented. No gross motor deficits.   Assessment/Plan: Small bowel obstruction. History of laparotomy an apparent small bowel resection approximately 10 years ago. I personally reviewed his CT scan which shows several dilated loops of small bowel with decompressed proximal and distal small bowel raising concern over closed loop obstruction. Currently however the patient appears very comfortable, states his pain is better and his abdomen is soft and nontender. I would certainly not recommend laparotomy currently as he hopefully is rapidly improving. We will repeat x-rays tomorrow and follow closely.  Shenekia Riess T 07/11/2014, 11:12 PM

## 2014-07-11 NOTE — ED Provider Notes (Signed)
CSN: [id number]     Arrival date & time 07/11/14  1239 History   First MD Initiated Contact with Patient 07/11/14 1538     Chief Complaint  Patient presents with  . Abdominal Pain     (Consider location/radiation/quality/duration/timing/severity/associated sxs/prior Treatment) Patient is a 69 y.o. male presenting with abdominal pain.  Abdominal Pain Pain location:  Periumbilical Pain quality: aching   Pain radiates to:  Does not radiate Pain severity:  Severe Onset quality:  Gradual Duration:  16 hours Timing:  Constant Progression:  Worsening Chronicity:  Recurrent Context comment:  History of abdominal surgery.  Started hurting this morning, had small bowel movement, then pain has progressively worsened since. Relieved by:  Nothing Ineffective treatments: zofran, zantac. Associated symptoms: constipation, nausea and vomiting   Associated symptoms: no dysuria, no fever and no flatus     Past Medical History  Diagnosis Date  . History of small bowel obstruction     x2  . Abnormal glucose    Past Surgical History  Procedure Laterality Date  . Resection small bowel / closure ileostomy     No family history on file. History  Substance Use Topics  . Smoking status: Former Research scientist (life sciences)  . Smokeless tobacco: Not on file  . Alcohol Use: No    Review of Systems  Constitutional: Negative for fever.  Gastrointestinal: Positive for nausea, vomiting, abdominal pain and constipation. Negative for flatus.  Genitourinary: Negative for dysuria.  All other systems reviewed and are negative.     Allergies  Review of patient's allergies indicates no known allergies.  Home Medications   Prior to Admission medications   Medication Sig Start Date End Date Taking? Authorizing Provider  Cholecalciferol (VITAMIN D PO) Take 1 capsule by mouth daily.   Yes Historical Provider, MD  famotidine (PEPCID) 20 MG tablet Take 20 mg by mouth daily.   Yes Historical Provider, MD  ondansetron  (ZOFRAN-ODT) 4 MG disintegrating tablet Take 4 mg by mouth every 8 (eight) hours as needed for nausea or vomiting.   Yes Historical Provider, MD  ranitidine (ZANTAC) 75 MG tablet Take 75 mg by mouth daily as needed for heartburn.   Yes Historical Provider, MD   BP 140/77 mmHg  Pulse 57  Temp(Src) 98.2 F (36.8 C) (Oral)  Resp 18  SpO2 100% Physical Exam  Constitutional: He is oriented to person, place, and time. He appears well-developed and well-nourished. No distress.  HENT:  Head: Normocephalic and atraumatic.  Mouth/Throat: Oropharynx is clear and moist.  Eyes: Conjunctivae are normal. Pupils are equal, round, and reactive to light. No scleral icterus.  Neck: Neck supple.  Cardiovascular: Normal rate, regular rhythm, normal heart sounds and intact distal pulses.   No murmur heard. Pulmonary/Chest: Effort normal and breath sounds normal. No stridor. No respiratory distress. He has no wheezes. He has no rales.  Abdominal: Soft. He exhibits no distension. There is tenderness in the periumbilical area and left lower quadrant. There is no rigidity, no rebound and no guarding.  Musculoskeletal: Normal range of motion. He exhibits no edema.  Neurological: He is alert and oriented to person, place, and time.  Skin: Skin is warm and dry. No rash noted.  Psychiatric: He has a normal mood and affect. His behavior is normal.  Nursing note and vitals reviewed.   ED Course  Procedures (including critical care time) Labs Review Labs Reviewed  CBC WITH DIFFERENTIAL/PLATELET - Abnormal; Notable for the following:    Neutrophils Relative % 87 (*)  Lymphocytes Relative 10 (*)    All other components within normal limits  COMPREHENSIVE METABOLIC PANEL - Abnormal; Notable for the following:    Glucose, Bld 151 (*)    GFR calc non Af Amer 75 (*)    GFR calc Af Amer 87 (*)    All other components within normal limits  LIPASE, BLOOD  URINALYSIS, ROUTINE W REFLEX MICROSCOPIC    Imaging  Review Ct Abdomen Pelvis W Contrast  07/11/2014   CLINICAL DATA:  Lower abdominal pain beginning this morning with nausea and vomiting. Personal history of small bowel obstruction.  EXAM: CT ABDOMEN AND PELVIS WITH CONTRAST  TECHNIQUE: Multidetector CT imaging of the abdomen and pelvis was performed using the standard protocol following bolus administration of intravenous contrast.  CONTRAST:  38mL OMNIPAQUE IOHEXOL 300 MG/ML SOLN, 153mL OMNIPAQUE IOHEXOL 300 MG/ML SOLN  COMPARISON:  Radiography 06/20/2014.  CT abdomen 04/28/2008.  FINDINGS: Lung bases are clear. No pleural or pericardial fluid. The liver parenchyma shows multiple benign appearing cysts, which have grown since 2010. Index is cyst in the anterior segment right lobe previously measured 10 mm in diameter and now measures 20 mm in diameter. No lesion that does not look like a cyst. No calcified gallstones. The spleen is normal. The pancreas is normal. The adrenal glands are normal. Kidneys are normal. No cyst, mass, stone or hydronephrosis. The aorta and IVC are normal except for some early atherosclerotic change of the aorta without aneurysm. There are dilated fluid and air-filled loops of small intestine consistent with early/partial small bowel obstruction. Question internal hernia, as the segment of dilated small bowel appears to be segmental. The colon appears normal. No free fluid or air. Bladder appears normal. Ordinary degenerative changes affect the spine.  IMPRESSION: Early/partial small bowel obstruction which appears segmental, raising the possibility of internal hernia with closed loop obstruction.   Electronically Signed   By: Nelson Chimes M.D.   On: 07/11/2014 20:00  All radiology studies independently viewed by me.      EKG Interpretation None      MDM   Final diagnoses:  Abdominal pain  Small bowel obstruction    Pt with abdominal pain, states it feels similar to when he has had a bowel obstruction.  IV morphine and  zofran for symptoms . Plan CT.    CT shows partial SBO.  I spoke with Dr. Excell Seltzer (General Surgery) who will evaluation.  I spoke with Dr. Waldron Labs ([facility]) who will admit.  NG tube being placed.    Serita Grit, MD 07/11/14 2029

## 2014-07-11 NOTE — ED Notes (Signed)
Pt has been told a urine sample is needed form him, urinal at the bedside

## 2014-07-11 NOTE — ED Notes (Signed)
Pt c/o lower abd pain that started this morning along with n/v.

## 2014-07-11 NOTE — H&P (Addendum)
Patient Demographics  Richard Middleton, is a 69 y.o. male  MRN: [id number]   DOB - 1946-03-05  Admit Date - 07/11/2014  Outpatient Primary MD for the patient is Drema Pry, DO   With History of -  Past Medical History  Diagnosis Date  . History of small bowel obstruction     x2  . Abnormal glucose       Past Surgical History  Procedure Laterality Date  . Resection small bowel / closure ileostomy      in for   Chief Complaint  Patient presents with  . Abdominal Pain     HPI  Richard Middleton  is a 69 y.o. male, with past medical history of status post bowel resection in 2005, with history of recurrent small bowel obstruction, with multiple admissions in the past for recurrent small bowel obstruction, most recent in 2012, these episodes have been managed conservatively without surgical intervention, patient presented with complaints of nausea, vomiting, abdominal pain, started this a.m., works last bowel movement was 1:30 AM, small amount, no flatness since, has any chest pain, fever, cough, bright red blood per rectum, coffee-ground emesis, CT abdomen and pelvis showing early/partial small bowel obstruction raising the possibility of internal hernia with closed loop obstruction, ED discussed with surgical service, who recommended admission to hospitalist service, and they will follow, meanwhile did recommend NG tube insertion, with intermittent suctioning ( already having more than 500 mL within 15 minutes after NG tube insertion).    Review of Systems    In addition to the HPI above,  No Fever-chills, No Headache, No changes with Vision or hearing, No problems swallowing food or Liquids, No Chest pain, Cough or Shortness of Breath, Complains of Abdominal pain,  Nausea and Vommitting,  No Blood in stool or Urine, No dysuria, No new skin rashes or bruises, No new joints pains-aches,  No new weakness, tingling, numbness in any extremity, No recent weight gain or loss, No  polyuria, polydypsia or polyphagia, No significant Mental Stressors.  A full 10 point Review of Systems was done, except as stated above, all other Review of Systems were negative.   Social History History  Substance Use Topics  . Smoking status: Former Research scientist (life sciences)  . Smokeless tobacco: Not on file  . Alcohol Use: No   Family History No family history on file. Denies any family history of colon cancer, diabetes, or hypertension.  Prior to Admission medications   Medication Sig Start Date End Date Taking? Authorizing Provider  Cholecalciferol (VITAMIN D PO) Take 1 capsule by mouth daily.   Yes Historical Provider, MD  famotidine (PEPCID) 20 MG tablet Take 20 mg by mouth daily.   Yes Historical Provider, MD  ondansetron (ZOFRAN-ODT) 4 MG disintegrating tablet Take 4 mg by mouth every 8 (eight) hours as needed for nausea or vomiting.   Yes Historical Provider, MD  ranitidine (ZANTAC) 75 MG tablet Take 75 mg by mouth daily as needed for heartburn.   Yes Historical Provider, MD    No Known Allergies  Physical Exam  Vitals  Blood pressure 147/81, pulse 62, temperature 98.2 F (36.8 C), temperature source Oral, resp. rate 16, SpO2 100 %.   1. General elderly male lying in bed in discomfort secondary to mild nausea.  2. Normal affect and insight, Not Suicidal or Homicidal, Awake Alert, Oriented X 3.  3. No F.N deficits, ALL C.Nerves Intact, Strength 5/5 all 4 extremities, Sensation intact all 4 extremities, Plantars down going.  4.  Ears and Eyes appear Normal, Conjunctivae clear, PERRLA. Moist Oral Mucosa.  5. Supple Neck, No JVD, No cervical lymphadenopathy appriciated, No Carotid Bruits.  6. Symmetrical Chest wall movement, Good air movement bilaterally, CTAB.  7. RRR, No Gallops, Rubs or Murmurs, No Parasternal Heave.  8. Positive Bowel Sounds ( hyperperistaltic), Abdomen Soft, No tenderness, No organomegaly appriciated,No rebound -guarding or rigidity.  9.  No Cyanosis,  Normal Skin Turgor, No Skin Rash or Bruise.  10. Good muscle tone,  joints appear normal , no effusions, Normal ROM.  11. No Palpable Lymph Nodes in Neck or Axillae    Data Review  CBC  Recent Labs Lab 07/11/14 1312  WBC 6.6  HGB 13.8  HCT 42.9  PLT 195  MCV 83.6  MCH 26.9  MCHC 32.2  RDW 13.3  LYMPHSABS 0.7  MONOABS 0.2  EOSABS 0.0  BASOSABS 0.0   ------------------------------------------------------------------------------------------------------------------  Chemistries   Recent Labs Lab 07/11/14 1312  NA 137  K 4.1  CL 101  CO2 25  GLUCOSE 151*  BUN 15  CREATININE 1.01  CALCIUM 9.5  AST 28  ALT 26  ALKPHOS 96  BILITOT 1.0   ------------------------------------------------------------------------------------------------------------------ estimated creatinine clearance is 77.9 mL/min (by C-G formula based on Cr of 1.01). ------------------------------------------------------------------------------------------------------------------ No results for input(s): TSH, T4TOTAL, T3FREE, THYROIDAB in the last 72 hours.  Invalid input(s): FREET3   Coagulation profile No results for input(s): INR, PROTIME in the last 168 hours. ------------------------------------------------------------------------------------------------------------------- No results for input(s): DDIMER in the last 72 hours. -------------------------------------------------------------------------------------------------------------------  Cardiac Enzymes No results for input(s): CKMB, TROPONINI, MYOGLOBIN in the last 168 hours.  Invalid input(s): CK ------------------------------------------------------------------------------------------------------------------ Invalid input(s): POCBNP   ---------------------------------------------------------------------------------------------------------------  Urinalysis    Component Value Date/Time   COLORURINE YELLOW 07/11/2014 [place] 07/11/2014 1239   LABSPEC 1.031* 07/11/2014 1239   PHURINE 7.5 07/11/2014 1239   GLUCOSEU NEGATIVE 07/11/2014 1239   GLUCOSEU NEGATIVE 06/22/2010 0757   HGBUR NEGATIVE 07/11/2014 1239   BILIRUBINUR NEGATIVE 07/11/2014 1239   BILIRUBINUR n 06/27/2014 1034   KETONESUR 40* 07/11/2014 1239   PROTEINUR NEGATIVE 07/11/2014 1239   PROTEINUR n 06/27/2014 1034   UROBILINOGEN 0.2 07/11/2014 1239   UROBILINOGEN 0.2 06/27/2014 1034   NITRITE NEGATIVE 07/11/2014 1239   NITRITE n 06/27/2014 1034   LEUKOCYTESUR NEGATIVE 07/11/2014 1239    ----------------------------------------------------------------------------------------------------------------  Imaging results:   Ct Abdomen Pelvis W Contrast  07/11/2014   CLINICAL DATA:  Lower abdominal pain beginning this morning with nausea and vomiting. Personal history of small bowel obstruction.  EXAM: CT ABDOMEN AND PELVIS WITH CONTRAST  TECHNIQUE: Multidetector CT imaging of the abdomen and pelvis was performed using the standard protocol following bolus administration of intravenous contrast.  CONTRAST:  38mL OMNIPAQUE IOHEXOL 300 MG/ML SOLN, 156mL OMNIPAQUE IOHEXOL 300 MG/ML SOLN  COMPARISON:  Radiography 06/20/2014.  CT abdomen 04/28/2008.  FINDINGS: Lung bases are clear. No pleural or pericardial fluid. The liver parenchyma shows multiple benign appearing cysts, which have grown since 2010. Index is cyst in the anterior segment right lobe previously measured 10 mm in diameter and now measures 20 mm in diameter. No lesion that does not look like a cyst. No calcified gallstones. The spleen is normal. The pancreas is normal. The adrenal glands are normal. Kidneys are normal. No cyst, mass, stone or hydronephrosis. The aorta and IVC are normal except for some early atherosclerotic change of the aorta without aneurysm. There are dilated fluid and air-filled loops of small intestine consistent with early/partial small  bowel obstruction. Question  internal hernia, as the segment of dilated small bowel appears to be segmental. The colon appears normal. No free fluid or air. Bladder appears normal. Ordinary degenerative changes affect the spine.  IMPRESSION: Early/partial small bowel obstruction which appears segmental, raising the possibility of internal hernia with closed loop obstruction.   Electronically Signed   By: Nelson Chimes M.D.   On: 07/11/2014 20:00       Assessment & Plan  Active Problems:   Small bowel obstruction   Partial/early Small bowel obstruction: - Patient has history of recurrent small bowel obstruction in the past, status post bowel resection in 2005, presents with nausea, vomiting, abdominal pain, symptoms much improved currently after NG tube insertion, will admit to medical floor, continue NG tube on intermittent suctioning as per surgical recommendation, keep nothing by mouth, on when necessary nausea and pain medicine,  further management as per surgery.    DVT Prophylaxis Heparin -   SCDs  AM Labs Ordered, also please review Full Orders  Family Communication: Admission, patients condition and plan of care including tests being ordered have been discussed with the patient and wife who indicate understanding and agree with the plan and Code Status.  Code Status full  Likely DC to  home when stable  Condition GUARDED    Time spent in minutes : 55 minutes    Channon Ambrosini M.D on 07/11/2014 at 9:03 PM  Between 7am to 7pm - Pager - [phone]  After 7pm go to [email or web address] - password [email or web address]  And look for the night coverage person covering me after hours  [facility] Office  [phone]   **Disclaimer: This note may have been dictated with voice recognition software. Similar sounding words can inadvertently be transcribed and this note may contain transcription errors which may not have been corrected upon publication of note.**

## 2014-07-12 ENCOUNTER — Inpatient Hospital Stay (HOSPITAL_COMMUNITY): Payer: Commercial Managed Care - HMO

## 2014-07-12 LAB — CBC
HEMATOCRIT: 43.6 % (ref 39.0–52.0)
Hemoglobin: 14.4 g/dL (ref 13.0–17.0)
MCH: 27.5 pg (ref 26.0–34.0)
MCHC: 33 g/dL (ref 30.0–36.0)
MCV: 83.4 fL (ref 78.0–100.0)
Platelets: 192 10*3/uL (ref 150–400)
RBC: 5.23 MIL/uL (ref 4.22–5.81)
RDW: 13.5 % (ref 11.5–15.5)
WBC: 7.7 10*3/uL (ref 4.0–10.5)

## 2014-07-12 LAB — BASIC METABOLIC PANEL
Anion gap: 8 (ref 5–15)
BUN: 11 mg/dL (ref 6–23)
CALCIUM: 9 mg/dL (ref 8.4–10.5)
CO2: 27 mmol/L (ref 19–32)
Chloride: 104 mmol/L (ref 96–112)
Creatinine, Ser: 0.95 mg/dL (ref 0.50–1.35)
GFR calc Af Amer: >90 mL/min (ref >90)
GFR, EST NON AFRICAN AMERICAN: 84 mL/min — AB (ref >90)
Glucose, Bld: 116 mg/dL — ABNORMAL HIGH (ref 70–99)
POTASSIUM: 4.2 mmol/L (ref 3.5–5.1)
Sodium: 139 mmol/L (ref 135–145)

## 2014-07-12 MED ORDER — PROMETHAZINE HCL 25 MG/ML IJ SOLN
12.5000 mg | INTRAMUSCULAR | Status: DC | PRN
  Administered 2014-07-12 (×4): 12.5 mg via INTRAVENOUS
  Filled 2014-07-12 (×4): qty 1

## 2014-07-12 MED ORDER — PROMETHAZINE HCL 25 MG/ML IJ SOLN
12.5000 mg | Freq: Four times a day (QID) | INTRAMUSCULAR | Status: DC | PRN
  Administered 2014-07-12 – 2014-07-14 (×4): 12.5 mg via INTRAVENOUS
  Filled 2014-07-12 (×4): qty 1

## 2014-07-12 MED ORDER — DIATRIZOATE MEGLUMINE & SODIUM 66-10 % PO SOLN
90.0000 mL | Freq: Once | ORAL | Status: AC
  Administered 2014-07-12: 90 mL via NASOGASTRIC

## 2014-07-12 NOTE — Progress Notes (Signed)
PROGRESS NOTE    SHAMEEK NYQUIST GBT:[id number] DOB: 08-29-1945 DOA: 07/11/2014 PCP: Drema Pry, DO  HPI/Brief narrative 69 year old male with no significant past medical history except for recurrent SBO's related to adhesions, presented with complaints of nausea, vomiting and abdominal pain and CT abdomen was concerning for partial small bowel obstruction with possibility of internal hernia with closed loop obstruction.  Assessment/Plan:  Small bowel obstruction, recurrent - Possibly related to adhesions. - Treated conservatively with bowel rest- npo/NG tube, IV fluids and serial imaging - Clinically improved but no BM or flatus yet. - Mx per surgery: since all issues are primarily surgical and there are no active medical issues, requested them to take over attending role on 3/29 and they declined.   Code Status: Full Family Communication: None at bedside Disposition Plan: per surgery   Consultants:  Surgery  Procedures:  NGT  Antibiotics:  None   Subjective: Feels better. Less abdominal pain: 3/10. No flatus or BM.  Objective: Filed Vitals:   07/11/14 2143 07/11/14 2144 07/12/14 0620 07/12/14 1351  BP:  152/80 149/79 143/72  Pulse:  60 65 58  Temp:  98.4 F (36.9 C) 98.9 F (37.2 C) 97.9 F (36.6 C)  TempSrc:  Oral Oral Oral  Resp:  16 16 16   Height: 6\' 1"  (1.854 m)     Weight: 80.287 kg (177 lb)     SpO2:  99% 100% 100%    Intake/Output Summary (Last 24 hours) at 07/12/14 1750 Last data filed at 07/12/14 1351  Gross per 24 hour  Intake    640 ml  Output    750 ml  Net   -110 ml   Filed Weights   07/11/14 2143  Weight: 80.287 kg (177 lb)     Exam:  General exam: Pleasant middle aged male lying comfortably supine in bed. Respiratory system: Clear. No increased work of breathing. Cardiovascular system: S1 & S2 heard, RRR. No JVD, murmurs, gallops, clicks or pedal edema. Gastrointestinal system: Abdomen is nondistended, soft and nontender. Normal  bowel sounds heard. NGT+ Central nervous system: Alert and oriented. No focal neurological deficits. Extremities: Symmetric 5 x 5 power.   Data Reviewed: Basic Metabolic Panel:  Recent Labs Lab 07/11/14 1312 07/12/14 0435  NA 137 139  K 4.1 4.2  CL 101 104  CO2 25 27  GLUCOSE 151* 116*  BUN 15 11  CREATININE 1.01 0.95  CALCIUM 9.5 9.0   Liver Function Tests:  Recent Labs Lab 07/11/14 1312  AST 28  ALT 26  ALKPHOS 96  BILITOT 1.0  PROT 7.6  ALBUMIN 4.2    Recent Labs Lab 07/11/14 1312  LIPASE 23   No results for input(s): AMMONIA in the last 168 hours. CBC:  Recent Labs Lab 07/11/14 1312 07/12/14 0435  WBC 6.6 7.7  NEUTROABS 5.8  --   HGB 13.8 14.4  HCT 42.9 43.6  MCV 83.6 83.4  PLT 195 192   Cardiac Enzymes: No results for input(s): CKTOTAL, CKMB, CKMBINDEX, TROPONINI in the last 168 hours. BNP (last 3 results) No results for input(s): PROBNP in the last 8760 hours. CBG: No results for input(s): GLUCAP in the last 168 hours.  No results found for this or any previous visit (from the past 240 hour(s)).       Studies: Ct Abdomen Pelvis W Contrast  07/11/2014   CLINICAL DATA:  Lower abdominal pain beginning this morning with nausea and vomiting. Personal history of small bowel obstruction.  EXAM: CT ABDOMEN  AND PELVIS WITH CONTRAST  TECHNIQUE: Multidetector CT imaging of the abdomen and pelvis was performed using the standard protocol following bolus administration of intravenous contrast.  CONTRAST:  56mL OMNIPAQUE IOHEXOL 300 MG/ML SOLN, 144mL OMNIPAQUE IOHEXOL 300 MG/ML SOLN  COMPARISON:  Radiography 06/20/2014.  CT abdomen 04/28/2008.  FINDINGS: Lung bases are clear. No pleural or pericardial fluid. The liver parenchyma shows multiple benign appearing cysts, which have grown since 2010. Index is cyst in the anterior segment right lobe previously measured 10 mm in diameter and now measures 20 mm in diameter. No lesion that does not look like a cyst.  No calcified gallstones. The spleen is normal. The pancreas is normal. The adrenal glands are normal. Kidneys are normal. No cyst, mass, stone or hydronephrosis. The aorta and IVC are normal except for some early atherosclerotic change of the aorta without aneurysm. There are dilated fluid and air-filled loops of small intestine consistent with early/partial small bowel obstruction. Question internal hernia, as the segment of dilated small bowel appears to be segmental. The colon appears normal. No free fluid or air. Bladder appears normal. Ordinary degenerative changes affect the spine.  IMPRESSION: Early/partial small bowel obstruction which appears segmental, raising the possibility of internal hernia with closed loop obstruction.   Electronically Signed   By: Nelson Chimes M.D.   On: 07/11/2014 20:00   Dg Abd Portable 1v-small Bowel Obstruction Protocol-initial, 8 Hr Delay  07/12/2014   CLINICAL DATA:  Small bowel obstruction.  8 hr small bowel film.  EXAM: PORTABLE ABDOMEN - 1 VIEW  COMPARISON:  Abdominal radiographs earlier this day at 07:46 hr  FINDINGS: There is enteric contrast within the stomach, duodenum, and proximal small bowel. There is no contrast within the colon. Dilated small bowel loops in the central abdomen persist. No free intra-abdominal air. Enteric tube in place, tip and side-port in the stomach.  IMPRESSION: Enteric contrast within the stomach, duodenum, and proximal small bowel, no contrast within the colon. 24 hour film is needed.   Electronically Signed   By: Jeb Levering M.D.   On: 07/12/2014 17:27   Dg Abd Portable 1v-small Bowel Protocol-position Verification  07/12/2014   CLINICAL DATA:  Small bowel obstruction.  EXAM: PORTABLE ABDOMEN - 1 VIEW  COMPARISON:  CT on 07/11/2014  FINDINGS: A new nasogastric tube is now seen with tip in the fundus of the stomach. Dilated small bowel loops in the central abdomen are increased since previous study. The colon remains nondilated.  This is suspicious for a worsening mid small bowel obstruction.  IMPRESSION: Increased small bowel dilatation, suspicious for worsening small bowel obstruction.  New nasogastric tube tip in fundus of stomach.   Electronically Signed   By: Earle Gell M.D.   On: 07/12/2014 08:12        Scheduled Meds: . heparin  5,000 Units Subcutaneous 3 times per day   Continuous Infusions: . sodium chloride 75 mL/hr (07/12/14 1052)    Active Problems:   Small bowel obstruction    Time spent: 20 minutes.    Vernell Leep, MD, FACP, FHM. [facility] Pager [phone]  If 7PM-7AM, please contact night-coverage [email or web address] Password [email or web address] 07/12/2014, 5:50 PM    LOS: 1 day

## 2014-07-12 NOTE — Progress Notes (Signed)
Patient ID: Richard Middleton, male   DOB: [birth year], 69 y.o.   MRN: [id number]     [facility]      [address] [place], [place] [address]    Phone: [phone] FAX: [phone]     Subjective: No flatus.  No pain.  No n/v.  215m from NGT recorded.  AXR reviewed, NGT at fundus.    Objective:  Vital signs:  Filed Vitals:   07/11/14 2100 07/11/14 2143 07/11/14 2144 07/12/14 0620  BP: 147/81  152/80 149/79  Pulse: 62  60 65  Temp:   98.4 F (36.9 C) 98.9 F (37.2 C)  TempSrc:   Oral Oral  Resp: '16  16 16  ' Height:  '6\' 1"'  (1.854 m)    Weight:  80.287 kg (177 lb)    SpO2: 100%  99% 100%    Last BM Date: 07/11/14  Intake/Output   Yesterday:  03/28 0701 - 03/29 0700 In: 640 [I.V.:620; NG/GT:20] Out: 200 [Emesis/NG output:200] This shift:  Total I/O In: 0  Out: 175 [Urine:175]   Physical Exam: General: Pt awake/alert/oriented x4 in no acute distress Abdomen: Soft.  Nondistended.  Non tender.  No evidence of peritonitis.  No incarcerated hernias.    Problem List:   Active Problems:   Small bowel obstruction    Results:   Labs: Results for orders placed or performed during the hospital encounter of 07/11/14 (from the past 48 hour(s))  Urinalysis, Routine w reflex microscopic     Status: Abnormal   Collection Time: 07/11/14 12:39 PM  Result Value Ref Range   Color, Urine YELLOW YELLOW   APPearance CLEAR CLEAR   Specific Gravity, Urine 1.031 (H) 1.005 - 1.030   pH 7.5 5.0 - 8.0   Glucose, UA NEGATIVE NEGATIVE mg/dL   Hgb urine dipstick NEGATIVE NEGATIVE   Bilirubin Urine NEGATIVE NEGATIVE   Ketones, ur 40 (A) NEGATIVE mg/dL   Protein, ur NEGATIVE NEGATIVE mg/dL   Urobilinogen, UA 0.2 0.0 - 1.0 mg/dL   Nitrite NEGATIVE NEGATIVE   Leukocytes, UA NEGATIVE NEGATIVE    Comment: MICROSCOPIC NOT DONE ON URINES WITH NEGATIVE PROTEIN, BLOOD, LEUKOCYTES, NITRITE, OR GLUCOSE <1000 mg/dL.  CBC with Differential      Status: Abnormal   Collection Time: 07/11/14  1:12 PM  Result Value Ref Range   WBC 6.6 4.0 - 10.5 K/uL   RBC 5.13 4.22 - 5.81 MIL/uL   Hemoglobin 13.8 13.0 - 17.0 g/dL   HCT 42.9 39.0 - 52.0 %   MCV 83.6 78.0 - 100.0 fL   MCH 26.9 26.0 - 34.0 pg   MCHC 32.2 30.0 - 36.0 g/dL   RDW 13.3 11.5 - 15.5 %   Platelets 195 150 - 400 K/uL   Neutrophils Relative % 87 (H) 43 - 77 %   Neutro Abs 5.8 1.7 - 7.7 K/uL   Lymphocytes Relative 10 (L) 12 - 46 %   Lymphs Abs 0.7 0.7 - 4.0 K/uL   Monocytes Relative 3 3 - 12 %   Monocytes Absolute 0.2 0.1 - 1.0 K/uL   Eosinophils Relative 0 0 - 5 %   Eosinophils Absolute 0.0 0.0 - 0.7 K/uL   Basophils Relative 0 0 - 1 %   Basophils Absolute 0.0 0.0 - 0.1 K/uL  Comprehensive metabolic panel     Status: Abnormal   Collection Time: 07/11/14  1:12 PM  Result Value Ref Range   Sodium 137 135 - 145 mmol/L  Potassium 4.1 3.5 - 5.1 mmol/L   Chloride 101 96 - 112 mmol/L   CO2 25 19 - 32 mmol/L   Glucose, Bld 151 (H) 70 - 99 mg/dL   BUN 15 6 - 23 mg/dL   Creatinine, Ser 1.01 0.50 - 1.35 mg/dL   Calcium 9.5 8.4 - 10.5 mg/dL   Total Protein 7.6 6.0 - 8.3 g/dL   Albumin 4.2 3.5 - 5.2 g/dL   AST 28 0 - 37 U/L   ALT 26 0 - 53 U/L   Alkaline Phosphatase 96 39 - 117 U/L   Total Bilirubin 1.0 0.3 - 1.2 mg/dL   GFR calc non Af Amer 75 (L) >90 mL/min   GFR calc Af Amer 87 (L) >90 mL/min    Comment: (NOTE) The eGFR has been calculated using the CKD EPI equation. This calculation has not been validated in all clinical situations. eGFR's persistently <90 mL/min signify possible Chronic Kidney Disease.    Anion gap 11 5 - 15  Lipase, blood     Status: None   Collection Time: 07/11/14  1:12 PM  Result Value Ref Range   Lipase 23 11 - 59 U/L  Basic metabolic panel     Status: Abnormal   Collection Time: 07/12/14  4:35 AM  Result Value Ref Range   Sodium 139 135 - 145 mmol/L   Potassium 4.2 3.5 - 5.1 mmol/L   Chloride 104 96 - 112 mmol/L   CO2 27 19 - 32  mmol/L   Glucose, Bld 116 (H) 70 - 99 mg/dL   BUN 11 6 - 23 mg/dL   Creatinine, Ser 0.95 0.50 - 1.35 mg/dL   Calcium 9.0 8.4 - 10.5 mg/dL   GFR calc non Af Amer 84 (L) >90 mL/min   GFR calc Af Amer >90 >90 mL/min    Comment: (NOTE) The eGFR has been calculated using the CKD EPI equation. This calculation has not been validated in all clinical situations. eGFR's persistently <90 mL/min signify possible Chronic Kidney Disease.    Anion gap 8 5 - 15  CBC     Status: None   Collection Time: 07/12/14  4:35 AM  Result Value Ref Range   WBC 7.7 4.0 - 10.5 K/uL   RBC 5.23 4.22 - 5.81 MIL/uL   Hemoglobin 14.4 13.0 - 17.0 g/dL   HCT 43.6 39.0 - 52.0 %   MCV 83.4 78.0 - 100.0 fL   MCH 27.5 26.0 - 34.0 pg   MCHC 33.0 30.0 - 36.0 g/dL   RDW 13.5 11.5 - 15.5 %   Platelets 192 150 - 400 K/uL    Imaging / Studies: Ct Abdomen Pelvis W Contrast  07/11/2014   CLINICAL DATA:  Lower abdominal pain beginning this morning with nausea and vomiting. Personal history of small bowel obstruction.  EXAM: CT ABDOMEN AND PELVIS WITH CONTRAST  TECHNIQUE: Multidetector CT imaging of the abdomen and pelvis was performed using the standard protocol following bolus administration of intravenous contrast.  CONTRAST:  6m OMNIPAQUE IOHEXOL 300 MG/ML SOLN, 1068mOMNIPAQUE IOHEXOL 300 MG/ML SOLN  COMPARISON:  Radiography 06/20/2014.  CT abdomen 04/28/2008.  FINDINGS: Lung bases are clear. No pleural or pericardial fluid. The liver parenchyma shows multiple benign appearing cysts, which have grown since 2010. Index is cyst in the anterior segment right lobe previously measured 10 mm in diameter and now measures 20 mm in diameter. No lesion that does not look like a cyst. No calcified gallstones. The spleen is normal. The  pancreas is normal. The adrenal glands are normal. Kidneys are normal. No cyst, mass, stone or hydronephrosis. The aorta and IVC are normal except for some early atherosclerotic change of the aorta without  aneurysm. There are dilated fluid and air-filled loops of small intestine consistent with early/partial small bowel obstruction. Question internal hernia, as the segment of dilated small bowel appears to be segmental. The colon appears normal. No free fluid or air. Bladder appears normal. Ordinary degenerative changes affect the spine.  IMPRESSION: Early/partial small bowel obstruction which appears segmental, raising the possibility of internal hernia with closed loop obstruction.   Electronically Signed   By: Nelson Chimes M.D.   On: 07/11/2014 20:00   Dg Abd Portable 1v-small Bowel Protocol-position Verification  07/12/2014   CLINICAL DATA:  Small bowel obstruction.  EXAM: PORTABLE ABDOMEN - 1 VIEW  COMPARISON:  CT on 07/11/2014  FINDINGS: A new nasogastric tube is now seen with tip in the fundus of the stomach. Dilated small bowel loops in the central abdomen are increased since previous study. The colon remains nondilated. This is suspicious for a worsening mid small bowel obstruction.  IMPRESSION: Increased small bowel dilatation, suspicious for worsening small bowel obstruction.  New nasogastric tube tip in fundus of stomach.   Electronically Signed   By: Earle Gell M.D.   On: 07/12/2014 08:12    Medications / Allergies:  Scheduled Meds: . heparin  5,000 Units Subcutaneous 3 times per day   Continuous Infusions: . sodium chloride 75 mL/hr at 07/11/14 2146   PRN Meds:.morphine injection, ondansetron **OR** ondansetron (ZOFRAN) IV, promethazine  Antibiotics: Anti-infectives    None        Assessment/Plan Small bowel obstruction -small bowel protocol initiated, gastrografin given -no urgent surgical indications.  Will await AXR later today.   -advance NGT -NPO -IVF -monitor electrolytes -mobilize -SCD/heparin   Erby Pian, ANP-BC [facility] Pager [phone](7A-4:30P)   07/12/2014 10:26 AM

## 2014-07-12 NOTE — Care Management Note (Signed)
    Page 1 of 1   07/12/2014     12:03:22 PM CARE MANAGEMENT NOTE 07/12/2014  Patient:  Richard Middleton, Richard Middleton   Account Number:  [id number]  Date Initiated:  07/12/2014  Documentation initiated by:  Sunday Spillers  Subjective/Objective Assessment:   69 yo male admitted with SBO. PTA lived at home with spouse.     Action/Plan:   Home when stable   Anticipated DC Date:  07/15/2014   Anticipated DC Plan:  May  CM consult      Choice offered to / List presented to:             Status of service:  Completed, signed off Medicare Important Message given?   (If response is "NO", the following Medicare IM given date fields will be blank) Date Medicare IM given:   Medicare IM given by:   Date Additional Medicare IM given:   Additional Medicare IM given by:    Discharge Disposition:  HOME/SELF CARE  Per UR Regulation:  Reviewed for med. necessity/level of care/duration of stay  If discussed at [place] of Stay Meetings, dates discussed:    Comments:

## 2014-07-13 ENCOUNTER — Inpatient Hospital Stay (HOSPITAL_COMMUNITY): Payer: Commercial Managed Care - HMO | Admitting: Certified Registered Nurse Anesthetist

## 2014-07-13 ENCOUNTER — Encounter (HOSPITAL_COMMUNITY): Admission: EM | Disposition: A | Discharge status: Home / Self Care | Payer: Self-pay | Source: Home / Self Care

## 2014-07-13 ENCOUNTER — Inpatient Hospital Stay (HOSPITAL_COMMUNITY): Payer: Commercial Managed Care - HMO

## 2014-07-13 HISTORY — PX: LAPAROTOMY: SHX154

## 2014-07-13 LAB — MRSA PCR SCREENING: MRSA BY PCR: NEGATIVE

## 2014-07-13 SURGERY — LAPAROTOMY, EXPLORATORY
Anesthesia: General | Site: Abdomen

## 2014-07-13 MED ORDER — PROPOFOL 10 MG/ML IV BOLUS
INTRAVENOUS | Status: DC | PRN
  Administered 2014-07-13: 150 mg via INTRAVENOUS

## 2014-07-13 MED ORDER — FENTANYL CITRATE 0.05 MG/ML IJ SOLN
INTRAMUSCULAR | Status: AC
  Filled 2014-07-13: qty 2

## 2014-07-13 MED ORDER — 0.9 % SODIUM CHLORIDE (POUR BTL) OPTIME
TOPICAL | Status: DC | PRN
  Administered 2014-07-13: 3000 mL

## 2014-07-13 MED ORDER — MEPERIDINE HCL 50 MG/ML IJ SOLN: 6.2500 mg | INTRAMUSCULAR | Status: DC | PRN

## 2014-07-13 MED ORDER — NEOSTIGMINE METHYLSULFATE 10 MG/10ML IV SOLN
INTRAVENOUS | Status: DC | PRN
  Administered 2014-07-13: 5 mg via INTRAVENOUS

## 2014-07-13 MED ORDER — FENTANYL CITRATE 0.05 MG/ML IJ SOLN
INTRAMUSCULAR | Status: DC | PRN
  Administered 2014-07-13: 50 ug via INTRAVENOUS
  Administered 2014-07-13: 25 ug via INTRAVENOUS
  Administered 2014-07-13: 50 ug via INTRAVENOUS
  Administered 2014-07-13: 25 ug via INTRAVENOUS
  Administered 2014-07-13 (×2): 50 ug via INTRAVENOUS

## 2014-07-13 MED ORDER — PROPOFOL 10 MG/ML IV BOLUS
INTRAVENOUS | Status: AC
  Filled 2014-07-13: qty 20

## 2014-07-13 MED ORDER — CEFAZOLIN SODIUM-DEXTROSE 2-3 GM-% IV SOLR
INTRAVENOUS | Status: AC
  Filled 2014-07-13: qty 50

## 2014-07-13 MED ORDER — LACTATED RINGERS IV SOLN: INTRAVENOUS | Status: DC

## 2014-07-13 MED ORDER — LACTATED RINGERS IV SOLN
INTRAVENOUS | Status: DC | PRN
  Administered 2014-07-13 (×2): via INTRAVENOUS

## 2014-07-13 MED ORDER — ONDANSETRON HCL 4 MG/2ML IJ SOLN
INTRAMUSCULAR | Status: AC
  Filled 2014-07-13: qty 2

## 2014-07-13 MED ORDER — EPHEDRINE SULFATE 50 MG/ML IJ SOLN
INTRAMUSCULAR | Status: DC | PRN
  Administered 2014-07-13: 10 mg via INTRAVENOUS

## 2014-07-13 MED ORDER — PHENYLEPHRINE 40 MCG/ML (10ML) SYRINGE FOR IV PUSH (FOR BLOOD PRESSURE SUPPORT)
PREFILLED_SYRINGE | INTRAVENOUS | Status: AC
  Filled 2014-07-13: qty 10

## 2014-07-13 MED ORDER — NEOSTIGMINE METHYLSULFATE 10 MG/10ML IV SOLN
INTRAVENOUS | Status: AC
  Filled 2014-07-13: qty 1

## 2014-07-13 MED ORDER — PHENYLEPHRINE HCL 10 MG/ML IJ SOLN
INTRAMUSCULAR | Status: DC | PRN
  Administered 2014-07-13: 80 ug via INTRAVENOUS

## 2014-07-13 MED ORDER — CEFAZOLIN SODIUM-DEXTROSE 2-3 GM-% IV SOLR
2.0000 g | INTRAVENOUS | Status: AC
  Administered 2014-07-13: 2 g via INTRAVENOUS

## 2014-07-13 MED ORDER — ROCURONIUM BROMIDE 100 MG/10ML IV SOLN
INTRAVENOUS | Status: AC
  Filled 2014-07-13: qty 1

## 2014-07-13 MED ORDER — ROCURONIUM BROMIDE 100 MG/10ML IV SOLN
INTRAVENOUS | Status: DC | PRN
  Administered 2014-07-13: 35 mg via INTRAVENOUS
  Administered 2014-07-13: 10 mg via INTRAVENOUS

## 2014-07-13 MED ORDER — FENTANYL CITRATE 0.05 MG/ML IJ SOLN
INTRAMUSCULAR | Status: AC
  Filled 2014-07-13: qty 5

## 2014-07-13 MED ORDER — GLYCOPYRROLATE 0.2 MG/ML IJ SOLN
INTRAMUSCULAR | Status: DC | PRN
  Administered 2014-07-13: 0.6 mg via INTRAVENOUS

## 2014-07-13 MED ORDER — EPHEDRINE SULFATE 50 MG/ML IJ SOLN
INTRAMUSCULAR | Status: AC
  Filled 2014-07-13: qty 1

## 2014-07-13 MED ORDER — LIDOCAINE HCL (CARDIAC) 20 MG/ML IV SOLN
INTRAVENOUS | Status: AC
  Filled 2014-07-13: qty 5

## 2014-07-13 MED ORDER — LIDOCAINE HCL (CARDIAC) 20 MG/ML IV SOLN
INTRAVENOUS | Status: DC | PRN
  Administered 2014-07-13: 100 mg via INTRAVENOUS

## 2014-07-13 MED ORDER — SODIUM CHLORIDE 0.9 % IJ SOLN
INTRAMUSCULAR | Status: AC
Start: 2014-07-13 — End: 2014-07-13
  Filled 2014-07-13: qty 10

## 2014-07-13 MED ORDER — HYDROMORPHONE HCL 1 MG/ML IJ SOLN
0.2500 mg | INTRAMUSCULAR | Status: DC | PRN
  Administered 2014-07-13 (×2): 0.5 mg via INTRAVENOUS

## 2014-07-13 MED ORDER — ONDANSETRON HCL 4 MG/2ML IJ SOLN
INTRAMUSCULAR | Status: DC | PRN
  Administered 2014-07-13: 4 mg via INTRAVENOUS

## 2014-07-13 MED ORDER — DEXAMETHASONE SODIUM PHOSPHATE 10 MG/ML IJ SOLN
INTRAMUSCULAR | Status: AC
  Filled 2014-07-13: qty 1

## 2014-07-13 MED ORDER — HYDROMORPHONE HCL 1 MG/ML IJ SOLN
INTRAMUSCULAR | Status: AC
  Filled 2014-07-13: qty 1

## 2014-07-13 MED ORDER — PROMETHAZINE HCL 25 MG/ML IJ SOLN: 6.2500 mg | INTRAMUSCULAR | Status: DC | PRN

## 2014-07-13 MED ORDER — MIDAZOLAM HCL 5 MG/5ML IJ SOLN
INTRAMUSCULAR | Status: DC | PRN
  Administered 2014-07-13 (×2): 1 mg via INTRAVENOUS

## 2014-07-13 MED ORDER — GLYCOPYRROLATE 0.2 MG/ML IJ SOLN
INTRAMUSCULAR | Status: AC
  Filled 2014-07-13: qty 3

## 2014-07-13 MED ORDER — MIDAZOLAM HCL 2 MG/2ML IJ SOLN
INTRAMUSCULAR | Status: AC
  Filled 2014-07-13: qty 2

## 2014-07-13 MED ORDER — FENTANYL CITRATE 0.05 MG/ML IJ SOLN
25.0000 ug | INTRAMUSCULAR | Status: DC | PRN
  Administered 2014-07-13: 25 ug via INTRAVENOUS
  Administered 2014-07-13: 50 ug via INTRAVENOUS

## 2014-07-13 MED ORDER — DEXAMETHASONE SODIUM PHOSPHATE 10 MG/ML IJ SOLN
INTRAMUSCULAR | Status: DC | PRN
  Administered 2014-07-13: 10 mg via INTRAVENOUS

## 2014-07-13 MED ORDER — SUCCINYLCHOLINE CHLORIDE 20 MG/ML IJ SOLN
INTRAMUSCULAR | Status: DC | PRN
  Administered 2014-07-13: 100 mg via INTRAVENOUS

## 2014-07-13 MED ORDER — SODIUM CHLORIDE 0.9 % IV SOLN
INTRAVENOUS | Status: DC
  Administered 2014-07-13 – 2014-07-14 (×2): via INTRAVENOUS
  Administered 2014-07-14: 100 mL/h via INTRAVENOUS
  Administered 2014-07-14: 12:00:00 via INTRAVENOUS
  Administered 2014-07-15: 100 mL/h via INTRAVENOUS
  Administered 2014-07-16: 03:00:00 via INTRAVENOUS

## 2014-07-13 SURGICAL SUPPLY — 45 items
BLADE EXTENDED COATED 6.5IN (ELECTRODE) IMPLANT
BLADE HEX COATED 2.75 (ELECTRODE) ×2 IMPLANT
BLADE SURG SZ10 CARB STEEL (BLADE) ×2 IMPLANT
CHLORAPREP W/TINT 26ML (MISCELLANEOUS) ×2 IMPLANT
CLIP TI LARGE 6 (CLIP) IMPLANT
COVER MAYO STAND STRL (DRAPES) ×2 IMPLANT
DRAPE LAPAROSCOPIC ABDOMINAL (DRAPES) ×2 IMPLANT
DRAPE SHEET LG 3/4 BI-LAMINATE (DRAPES) IMPLANT
DRAPE UTILITY XL STRL (DRAPES) ×2 IMPLANT
DRAPE WARM FLUID 44X44 (DRAPE) ×2 IMPLANT
ELECT REM PT RETURN 9FT ADLT (ELECTROSURGICAL) ×2
ELECTRODE REM PT RTRN 9FT ADLT (ELECTROSURGICAL) ×1 IMPLANT
GAUZE SPONGE 4X4 12PLY STRL (GAUZE/BANDAGES/DRESSINGS) ×2 IMPLANT
GLOVE BIO SURGEON STRL SZ 6.5 (GLOVE) ×3 IMPLANT
GLOVE BIOGEL PI IND STRL 7.0 (GLOVE) ×1 IMPLANT
GLOVE BIOGEL PI INDICATOR 7.0 (GLOVE) ×1
GOWN L4 XXLG W/PAP TWL (GOWN DISPOSABLE) ×2 IMPLANT
KIT BASIN OR (CUSTOM PROCEDURE TRAY) ×2 IMPLANT
LEGGING LITHOTOMY PAIR STRL (DRAPES) IMPLANT
LIGASURE IMPACT 36 18CM CVD LR (INSTRUMENTS) IMPLANT
NS IRRIG 1000ML POUR BTL (IV SOLUTION) ×4 IMPLANT
PACK GENERAL/GYN (CUSTOM PROCEDURE TRAY) ×2 IMPLANT
SHEARS HARMONIC ACE PLUS 36CM (ENDOMECHANICALS) IMPLANT
STAPLER VISISTAT 35W (STAPLE) ×1 IMPLANT
SUCTION POOLE TIP (SUCTIONS) ×2 IMPLANT
SUT NOV 1 T60/GS (SUTURE) IMPLANT
SUT NOVA NAB DX-16 0-1 5-0 T12 (SUTURE) ×2 IMPLANT
SUT NOVA NAB GS-21 0 18 T12 DT (SUTURE) ×1 IMPLANT
SUT NOVA NAB GS-22 2 0 T19 (SUTURE) ×1 IMPLANT
SUT PDS AB 1 CTX 36 (SUTURE) IMPLANT
SUT PDS AB 1 TP1 96 (SUTURE) IMPLANT
SUT SILK 2 0 (SUTURE) ×4
SUT SILK 2 0 SH CR/8 (SUTURE) ×1 IMPLANT
SUT SILK 2 0SH CR/8 30 (SUTURE) IMPLANT
SUT SILK 2-0 18XBRD TIE 12 (SUTURE) ×2 IMPLANT
SUT SILK 2-0 30XBRD TIE 12 (SUTURE) ×2 IMPLANT
SUT SILK 3 0 (SUTURE)
SUT SILK 3 0 SH CR/8 (SUTURE) ×1 IMPLANT
SUT SILK 3-0 18XBRD TIE 12 (SUTURE) IMPLANT
SUT VIC AB 3-0 54XBRD REEL (SUTURE) IMPLANT
SUT VIC AB 3-0 BRD 54 (SUTURE)
TAPE CLOTH SURG 4X10 WHT LF (GAUZE/BANDAGES/DRESSINGS) ×1 IMPLANT
TOWEL OR 17X26 10 PK STRL BLUE (TOWEL DISPOSABLE) ×2 IMPLANT
TRAY FOLEY CATH 14FRSI W/METER (CATHETERS) ×2 IMPLANT
YANKAUER SUCT BULB TIP NO VENT (SUCTIONS) ×2 IMPLANT

## 2014-07-13 NOTE — Anesthesia Preprocedure Evaluation (Signed)
Anesthesia Evaluation  Patient identified by MRN, date of birth, ID band Patient awake    Reviewed: Allergy & Precautions, NPO status , Patient's Chart, lab work & pertinent test results  Airway Mallampati: II  TM Distance: >3 FB Neck ROM: Full    Dental no notable dental hx.    Pulmonary neg pulmonary ROS, former smoker,  breath sounds clear to auscultation  Pulmonary exam normal       Cardiovascular negative cardio ROS  Rhythm:Regular Rate:Normal     Neuro/Psych negative neurological ROS  negative psych ROS   GI/Hepatic negative GI ROS, Neg liver ROS,   Endo/Other  negative endocrine ROS  Renal/GU negative Renal ROS  negative genitourinary   Musculoskeletal negative musculoskeletal ROS (+)   Abdominal   Peds negative pediatric ROS (+)  Hematology negative hematology ROS (+)   Anesthesia Other Findings   Reproductive/Obstetrics negative OB ROS                             Anesthesia Physical Anesthesia Plan  ASA: II and emergent  Anesthesia Plan: General   Post-op Pain Management:    Induction: Intravenous, Rapid sequence and Cricoid pressure planned  Airway Management Planned: Oral ETT  Additional Equipment:   Intra-op Plan:   Post-operative Plan: Extubation in OR  Informed Consent: I have reviewed the patients History and Physical, chart, labs and discussed the procedure including the risks, benefits and alternatives for the proposed anesthesia with the patient or authorized representative who has indicated his/her understanding and acceptance.   Dental advisory given  Plan Discussed with: CRNA  Anesthesia Plan Comments:         Anesthesia Quick Evaluation

## 2014-07-13 NOTE — Anesthesia Procedure Notes (Signed)
Procedure Name: Intubation Date/Time: 07/13/2014 12:30 PM Performed by: Maxwell Caul Pre-anesthesia Checklist: Patient identified, Emergency Drugs available, Suction available and Patient being monitored Patient Re-evaluated:Patient Re-evaluated prior to inductionOxygen Delivery Method: Circle System Utilized Preoxygenation: Pre-oxygenation with 100% oxygen Intubation Type: IV induction, Rapid sequence and Cricoid Pressure applied Laryngoscope Size: Mac and 4 Grade View: Grade I Tube type: Oral Tube size: 7.5 mm Number of attempts: 1 Airway Equipment and Method: Stylet and Oral airway Placement Confirmation: ETT inserted through vocal cords under direct vision,  positive ETCO2 and breath sounds checked- equal and bilateral Secured at: 21 cm Tube secured with: Tape Dental Injury: Teeth and Oropharynx as per pre-operative assessment

## 2014-07-13 NOTE — Progress Notes (Signed)
Patient ID: Richard Middleton, male   DOB: Sep 23, 1945, 69 y.o.   MRN: [id number]     [facility]      [address] [place], [place] [address]    Phone: [phone] FAX: [phone]     Subjective: More nausea.  No flatus or BM.  More abdominal pain, positional, worse with supine position.  VSS.  Afebrile.  353m recorded from NGT, dark, bilious.  Objective:  Vital signs:  Filed Vitals:   07/12/14 0620 07/12/14 1351 07/12/14 2149 07/13/14 0508  BP: 149/79 143/72 136/74 150/78  Pulse: 65 58 68 63  Temp: 98.9 F (37.2 C) 97.9 F (36.6 C) 98 F (36.7 C) 98.8 F (37.1 C)  TempSrc: Oral Oral Oral Oral  Resp: '16 16 16 16  ' Height:      Weight:      SpO2: 100% 100% 99% 100%    Last BM Date: 07/11/14  Intake/Output   Yesterday:  03/29 0701 - 03/30 0700 In: 1725 [I.V.:1725] Out: 1175 [Urine:875; Emesis/NG output:300] This shift:  Total I/O In: -  Out: 100 [Urine:100]  Physical Exam: General: Pt awake/alert/oriented x4 in no acute distress Abdomen: Soft.  distended. Non tender.  No evidence of peritonitis.  No incarcerated hernias.   Problem List:   Active Problems:   Small bowel obstruction    Results:   Labs: Results for orders placed or performed during the hospital encounter of 07/11/14 (from the past 48 hour(s))  Urinalysis, Routine w reflex microscopic     Status: Abnormal   Collection Time: 07/11/14 12:39 PM  Result Value Ref Range   Color, Urine YELLOW YELLOW   APPearance CLEAR CLEAR   Specific Gravity, Urine 1.031 (H) 1.005 - 1.030   pH 7.5 5.0 - 8.0   Glucose, UA NEGATIVE NEGATIVE mg/dL   Hgb urine dipstick NEGATIVE NEGATIVE   Bilirubin Urine NEGATIVE NEGATIVE   Ketones, ur 40 (A) NEGATIVE mg/dL   Protein, ur NEGATIVE NEGATIVE mg/dL   Urobilinogen, UA 0.2 0.0 - 1.0 mg/dL   Nitrite NEGATIVE NEGATIVE   Leukocytes, UA NEGATIVE NEGATIVE    Comment: MICROSCOPIC NOT DONE ON URINES WITH NEGATIVE PROTEIN,  BLOOD, LEUKOCYTES, NITRITE, OR GLUCOSE <1000 mg/dL.  CBC with Differential     Status: Abnormal   Collection Time: 07/11/14  1:12 PM  Result Value Ref Range   WBC 6.6 4.0 - 10.5 K/uL   RBC 5.13 4.22 - 5.81 MIL/uL   Hemoglobin 13.8 13.0 - 17.0 g/dL   HCT 42.9 39.0 - 52.0 %   MCV 83.6 78.0 - 100.0 fL   MCH 26.9 26.0 - 34.0 pg   MCHC 32.2 30.0 - 36.0 g/dL   RDW 13.3 11.5 - 15.5 %   Platelets 195 150 - 400 K/uL   Neutrophils Relative % 87 (H) 43 - 77 %   Neutro Abs 5.8 1.7 - 7.7 K/uL   Lymphocytes Relative 10 (L) 12 - 46 %   Lymphs Abs 0.7 0.7 - 4.0 K/uL   Monocytes Relative 3 3 - 12 %   Monocytes Absolute 0.2 0.1 - 1.0 K/uL   Eosinophils Relative 0 0 - 5 %   Eosinophils Absolute 0.0 0.0 - 0.7 K/uL   Basophils Relative 0 0 - 1 %   Basophils Absolute 0.0 0.0 - 0.1 K/uL  Comprehensive metabolic panel     Status: Abnormal   Collection Time: 07/11/14  1:12 PM  Result Value Ref Range   Sodium 137 135 -  145 mmol/L   Potassium 4.1 3.5 - 5.1 mmol/L   Chloride 101 96 - 112 mmol/L   CO2 25 19 - 32 mmol/L   Glucose, Bld 151 (H) 70 - 99 mg/dL   BUN 15 6 - 23 mg/dL   Creatinine, Ser 1.01 0.50 - 1.35 mg/dL   Calcium 9.5 8.4 - 10.5 mg/dL   Total Protein 7.6 6.0 - 8.3 g/dL   Albumin 4.2 3.5 - 5.2 g/dL   AST 28 0 - 37 U/L   ALT 26 0 - 53 U/L   Alkaline Phosphatase 96 39 - 117 U/L   Total Bilirubin 1.0 0.3 - 1.2 mg/dL   GFR calc non Af Amer 75 (L) >90 mL/min   GFR calc Af Amer 87 (L) >90 mL/min    Comment: (NOTE) The eGFR has been calculated using the CKD EPI equation. This calculation has not been validated in all clinical situations. eGFR's persistently <90 mL/min signify possible Chronic Kidney Disease.    Anion gap 11 5 - 15  Lipase, blood     Status: None   Collection Time: 07/11/14  1:12 PM  Result Value Ref Range   Lipase 23 11 - 59 U/L  Basic metabolic panel     Status: Abnormal   Collection Time: 07/12/14  4:35 AM  Result Value Ref Range   Sodium 139 135 - 145 mmol/L    Potassium 4.2 3.5 - 5.1 mmol/L   Chloride 104 96 - 112 mmol/L   CO2 27 19 - 32 mmol/L   Glucose, Bld 116 (H) 70 - 99 mg/dL   BUN 11 6 - 23 mg/dL   Creatinine, Ser 0.95 0.50 - 1.35 mg/dL   Calcium 9.0 8.4 - 10.5 mg/dL   GFR calc non Af Amer 84 (L) >90 mL/min   GFR calc Af Amer >90 >90 mL/min    Comment: (NOTE) The eGFR has been calculated using the CKD EPI equation. This calculation has not been validated in all clinical situations. eGFR's persistently <90 mL/min signify possible Chronic Kidney Disease.    Anion gap 8 5 - 15  CBC     Status: None   Collection Time: 07/12/14  4:35 AM  Result Value Ref Range   WBC 7.7 4.0 - 10.5 K/uL   RBC 5.23 4.22 - 5.81 MIL/uL   Hemoglobin 14.4 13.0 - 17.0 g/dL   HCT 43.6 39.0 - 52.0 %   MCV 83.4 78.0 - 100.0 fL   MCH 27.5 26.0 - 34.0 pg   MCHC 33.0 30.0 - 36.0 g/dL   RDW 13.5 11.5 - 15.5 %   Platelets 192 150 - 400 K/uL    Imaging / Studies: Ct Abdomen Pelvis W Contrast  07/11/2014   CLINICAL DATA:  Lower abdominal pain beginning this morning with nausea and vomiting. Personal history of small bowel obstruction.  EXAM: CT ABDOMEN AND PELVIS WITH CONTRAST  TECHNIQUE: Multidetector CT imaging of the abdomen and pelvis was performed using the standard protocol following bolus administration of intravenous contrast.  CONTRAST:  31m OMNIPAQUE IOHEXOL 300 MG/ML SOLN, 1054mOMNIPAQUE IOHEXOL 300 MG/ML SOLN  COMPARISON:  Radiography 06/20/2014.  CT abdomen 04/28/2008.  FINDINGS: Lung bases are clear. No pleural or pericardial fluid. The liver parenchyma shows multiple benign appearing cysts, which have grown since 2010. Index is cyst in the anterior segment right lobe previously measured 10 mm in diameter and now measures 20 mm in diameter. No lesion that does not look like a cyst. No calcified gallstones. The  spleen is normal. The pancreas is normal. The adrenal glands are normal. Kidneys are normal. No cyst, mass, stone or hydronephrosis. The aorta and  IVC are normal except for some early atherosclerotic change of the aorta without aneurysm. There are dilated fluid and air-filled loops of small intestine consistent with early/partial small bowel obstruction. Question internal hernia, as the segment of dilated small bowel appears to be segmental. The colon appears normal. No free fluid or air. Bladder appears normal. Ordinary degenerative changes affect the spine.  IMPRESSION: Early/partial small bowel obstruction which appears segmental, raising the possibility of internal hernia with closed loop obstruction.   Electronically Signed   By: Nelson Chimes M.D.   On: 07/11/2014 20:00   Dg Abd Portable 1v-small Bowel Obstruction Protocol-initial, 8 Hr Delay  07/12/2014   CLINICAL DATA:  Small bowel obstruction.  8 hr small bowel film.  EXAM: PORTABLE ABDOMEN - 1 VIEW  COMPARISON:  Abdominal radiographs earlier this day at 07:46 hr  FINDINGS: There is enteric contrast within the stomach, duodenum, and proximal small bowel. There is no contrast within the colon. Dilated small bowel loops in the central abdomen persist. No free intra-abdominal air. Enteric tube in place, tip and side-port in the stomach.  IMPRESSION: Enteric contrast within the stomach, duodenum, and proximal small bowel, no contrast within the colon. 24 hour film is needed.   Electronically Signed   By: Jeb Levering M.D.   On: 07/12/2014 17:27   Dg Abd Portable 1v-small Bowel Protocol-position Verification  07/12/2014   CLINICAL DATA:  Small bowel obstruction.  EXAM: PORTABLE ABDOMEN - 1 VIEW  COMPARISON:  CT on 07/11/2014  FINDINGS: A new nasogastric tube is now seen with tip in the fundus of the stomach. Dilated small bowel loops in the central abdomen are increased since previous study. The colon remains nondilated. This is suspicious for a worsening mid small bowel obstruction.  IMPRESSION: Increased small bowel dilatation, suspicious for worsening small bowel obstruction.  New nasogastric  tube tip in fundus of stomach.   Electronically Signed   By: Earle Gell M.D.   On: 07/12/2014 08:12    Medications / Allergies:  Scheduled Meds: . heparin  5,000 Units Subcutaneous 3 times per day   Continuous Infusions: . sodium chloride 75 mL/hr at 07/13/14 0200   PRN Meds:.morphine injection, ondansetron **OR** ondansetron (ZOFRAN) IV, promethazine  Antibiotics: Anti-infectives    None       Assessment/Plan Small bowel obstruction -8hr AXR without any contrast in the colon.  Will obtain a 24h follow up this morning at 0930, then decide whether to proceed with surgical intervention.  I suspect he will need surgery.   -advance NGT -NPO -IVF -monitor electrolytes -mobilize -SCD/heparin  Erby Pian, ANP-BC [facility] Pager [phone](7A-4:30P)   07/13/2014 8:10 AM

## 2014-07-13 NOTE — Op Note (Signed)
07/11/2014 - 07/13/2014  1:44 PM  PATIENT:  Richard Middleton  69 y.o. male  Patient Care Team: Doe-Hyun Kyra Searles, DO as PCP - General  PRE-OPERATIVE DIAGNOSIS:  small bowel obstruction  POST-OPERATIVE DIAGNOSIS:  small bowel obstruction  PROCEDURE:  EXPLORATORY LAPAROTOMY lysis of adhesions   Surgeon(s): Leighton Ruff, MD  ASSISTANT: none   ANESTHESIA:   general  EBL:  Total I/O In: 1000 [I.V.:1000] Out: 450 [Urine:450]  DRAINS: none   SPECIMEN:  No Specimen  DISPOSITION OF SPECIMEN:  N/A  COUNTS:  YES  PLAN OF CARE: patient admitted  PATIENT DISPOSITION:  PACU - hemodynamically stable.  INDICATION: 69 y.o. M with a h/o ex-lap who presents to the hospital with a SBO.  He failed a course of NG decompression.     OR FINDINGS: loop of small bowel adherent to midline scar entangled in omentum   DESCRIPTION: the patient was identified in the preoperative holding area and taken to the OR where they were laid supine on the operating room table.  General anesthesia was induced without difficulty. SCDs were also noted to be in place prior to the initiation of anesthesia.  The patient was then prepped and draped in the usual sterile fashion.  A surgical timeout was performed indicating the correct patient, procedure, positioning and need for preoperative antibiotics.  I began by making an incision with a 10 blade scalpel through the previous midline scar. This was carried down through the subcutaneous tissue using Bovie electrocautery. The fascia was incised at midline. I carefully divided the peritoneum and entered the abdomen without difficulty. The omentum was swept away from the midline wound and the remaining portion of the incision inferiorly was opened. An Intermed attention to the periumbilical portion of the incision. There was a loop of small bowel and omentum that was adherent very closely to this portion of the fascia at midline. I carefully took down the omentum and then began  to dissect the small bowel from the fascia using Metzenbaum scissors and gentle traction. Bowel was free, I evaluated this and it appeared to be the cause of his bowel obstruction with an obvious transition point noted and decompressed bowel distally. I closed a serosal tear using interrupted 3-0 silk sutures. I confirmed patency of this portion of bowel and then I evaluated the remaining small intestines. I started at the terminal ileum and evaluated the entire small bowel to the ligament of Treitz. There were no other signs of adhesions causing obstructions.  I placed the small bowel back into the abdomen. I evaluated in G-tube placement, which was in the correct position.  I placed the omentum over the small bowel and then closed the fascia using interrupted #1 Novafil sutures. The wound was irrigated and the skin was closed with staples. A sterile dressing was applied. The patient was awakened from anesthesia and sent to the postanesthesia care unit in stable condition. All counts were correct per operating room staff.

## 2014-07-13 NOTE — Transfer of Care (Signed)
Immediate Anesthesia Transfer of Care Note  Patient: Richard Middleton  Procedure(s) Performed: Procedure(s) (LRB): EXPLORATORY LAPAROTOMY lysis of adhesions (N/A)  Patient Location: PACU  Anesthesia Type: General  Level of Consciousness: sedated, patient cooperative and responds to stimulation  Airway & Oxygen Therapy: Patient Spontanous Breathing and Patient connected to face mask oxgen  Post-op Assessment: Report given to PACU RN and Post -op Vital signs reviewed and stable  Post vital signs: Reviewed and stable  Complications: No apparent anesthesia complications

## 2014-07-14 ENCOUNTER — Encounter (HOSPITAL_COMMUNITY): Payer: Self-pay | Admitting: General Surgery

## 2014-07-14 MED ORDER — ACETAMINOPHEN 325 MG PO TABS: 325.0000 mg | ORAL_TABLET | Freq: Four times a day (QID) | ORAL | Status: DC | PRN

## 2014-07-14 MED ORDER — HYDROCODONE-ACETAMINOPHEN 5-325 MG PO TABS
1.0000 | ORAL_TABLET | ORAL | Status: DC | PRN
Start: 2014-07-14 — End: 2014-07-17
  Administered 2014-07-14 (×2): 1 via ORAL
  Filled 2014-07-14: qty 2
  Filled 2014-07-14 (×2): qty 1

## 2014-07-14 MED ORDER — MENTHOL 3 MG MT LOZG
1.0000 | LOZENGE | OROMUCOSAL | Status: DC | PRN
  Filled 2014-07-14: qty 9

## 2014-07-14 NOTE — Progress Notes (Signed)
Patient's primary care transferred to the Surgical Service on 07/13/2014.

## 2014-07-14 NOTE — Progress Notes (Signed)
Patient ID: HARSHAAN WHANG, male   DOB: [birth year], 69 y.o.   MRN: [id number]     [facility]      [address] [place], [place] [address]    Phone: [phone] FAX: [phone]     Subjective: No flatus.  No n/v.  No pain if still, hasn't asked for pain meds.  Little mobilization.  Good UOP.  221ml from NGT.    Objective:  Vital signs:  Filed Vitals:   07/13/14 1813 07/13/14 2129 07/14/14 0215 07/14/14 0557  BP: 144/82 138/72 135/70 140/72  Pulse: 68 63 71 76  Temp: 97.8 F (36.6 C) 97.7 F (36.5 C) 99.8 F (37.7 C) 98.4 F (36.9 C)  TempSrc: Oral Oral Oral Oral  Resp: 20 20 20 20   Height:      Weight:      SpO2: 100% 100% 100% 99%    Last BM Date: 07/11/14  Intake/Output   Yesterday:  03/30 0701 - 03/31 0700 In: 2468.3 [I.V.:2468.3] Out: 3150 [Urine:2950; Emesis/NG output:200] This shift:  Total I/O In: 0  Out: 300 [Emesis/NG output:300]   Physical Exam: General: Pt awake/alert/oriented x4 in no acute distress Chest: cta.  No chest wall pain w good excursion CV:  Pulses intact.  Regular rhythm MS: Normal AROM mjr joints.  No obvious deformity Abdomen: Soft.  Nondistended.  +BS.  Dressing is dry, tender around incision.   No evidence of peritonitis.  No incarcerated hernias. Ext:  SCDs BLE.  No mjr edema.  No cyanosis Skin: No petechiae / purpura   Problem List:   Active Problems:   Small bowel obstruction    Results:   Labs: Results for orders placed or performed during the hospital encounter of 07/11/14 (from the past 48 hour(s))  MRSA PCR Screening     Status: None   Collection Time: 07/13/14 10:52 AM  Result Value Ref Range   MRSA by PCR NEGATIVE NEGATIVE    Comment:        The GeneXpert MRSA Assay (FDA approved for NASAL specimens only), is one component of a comprehensive MRSA colonization surveillance program. It is not intended to diagnose MRSA infection nor to guide or monitor  treatment for MRSA infections.     Imaging / Studies: Dg Abd Portable 1v-small Bowel Obstruction Protocol-24 Hr Delay  07/13/2014   CLINICAL DATA:  Small bowel obstruction.  EXAM: PORTABLE ABDOMEN - 1 VIEW  COMPARISON:  07/12/2014  FINDINGS: There is persistent marked dilatation of several mid small bowel loops. NG tube tip is in the body of the stomach. The colon is not distended.  IMPRESSION: Persistent small bowel obstruction. No change since the prior study.   Electronically Signed   By: Lorriane Shire M.D.   On: 07/13/2014 10:36   Dg Abd Portable 1v-small Bowel Obstruction Protocol-initial, 8 Hr Delay  07/12/2014   CLINICAL DATA:  Small bowel obstruction.  8 hr small bowel film.  EXAM: PORTABLE ABDOMEN - 1 VIEW  COMPARISON:  Abdominal radiographs earlier this day at 07:46 hr  FINDINGS: There is enteric contrast within the stomach, duodenum, and proximal small bowel. There is no contrast within the colon. Dilated small bowel loops in the central abdomen persist. No free intra-abdominal air. Enteric tube in place, tip and side-port in the stomach.  IMPRESSION: Enteric contrast within the stomach, duodenum, and proximal small bowel, no contrast within the colon. 24 hour film is needed.   Electronically Signed   By: Threasa Beards  Ehinger M.D.   On: 07/12/2014 17:27    Medications / Allergies:  Scheduled Meds: . heparin  5,000 Units Subcutaneous 3 times per day   Continuous Infusions: . sodium chloride 100 mL/hr at 07/14/14 0222   PRN Meds:.morphine injection, ondansetron **OR** ondansetron (ZOFRAN) IV, promethazine  Antibiotics: Anti-infectives    Start     Dose/Rate Route Frequency Ordered Stop   07/13/14 1117  [MAR Hold]  ceFAZolin (ANCEF) IVPB 2 g/50 mL premix     (MAR Hold since 07/13/14 1153)  Comments:  Pharmacy may adjust dosing strength, interval, or rate of medication as needed for optimal therapy for the patient  Send with patient on call to the OR.  Anesthesia to complete  antibiotic administration <75min prior to incision per [facility].   2 g 100 mL/hr over 30 Minutes Intravenous 30 min pre-op 07/13/14 1114 07/13/14 1242        Assessment/Plan SBO POD#1 exploratory laparotomy with LOA---Dr. Marcello Moores -clamp NGT, if able to tolerate will remove later today and allow for clears -mobilize -DC foley on POD#2 -remove dressing on POD#2 -IV pain meds, add PO -SCD/heparin -IVF -IS  Erby Pian, ANP-BC [facility] Pager [phone](7A-4:30P)   07/14/2014 10:13 AM

## 2014-07-15 LAB — BASIC METABOLIC PANEL
Anion gap: 5 (ref 5–15)
BUN: 17 mg/dL (ref 6–23)
CO2: 29 mmol/L (ref 19–32)
Calcium: 8.2 mg/dL — ABNORMAL LOW (ref 8.4–10.5)
Chloride: 103 mmol/L (ref 96–112)
Creatinine, Ser: 0.99 mg/dL (ref 0.50–1.35)
GFR calc Af Amer: >90 mL/min (ref >90)
GFR, EST NON AFRICAN AMERICAN: 83 mL/min — AB (ref >90)
Glucose, Bld: 97 mg/dL (ref 70–99)
Potassium: 3.6 mmol/L (ref 3.5–5.1)
Sodium: 137 mmol/L (ref 135–145)

## 2014-07-15 LAB — CBC
HCT: 38.1 % — ABNORMAL LOW (ref 39.0–52.0)
Hemoglobin: 12 g/dL — ABNORMAL LOW (ref 13.0–17.0)
MCH: 26.8 pg (ref 26.0–34.0)
MCHC: 31.5 g/dL (ref 30.0–36.0)
MCV: 85 fL (ref 78.0–100.0)
Platelets: 154 10*3/uL (ref 150–400)
RBC: 4.48 MIL/uL (ref 4.22–5.81)
RDW: 13.3 % (ref 11.5–15.5)
WBC: 5.1 10*3/uL (ref 4.0–10.5)

## 2014-07-15 MED ORDER — PANTOPRAZOLE SODIUM 40 MG IV SOLR
40.0000 mg | Freq: Every day | INTRAVENOUS | Status: DC
  Administered 2014-07-15 – 2014-07-18 (×4): 40 mg via INTRAVENOUS
  Filled 2014-07-15 (×4): qty 40

## 2014-07-15 MED ORDER — KETOROLAC TROMETHAMINE 15 MG/ML IJ SOLN
15.0000 mg | Freq: Three times a day (TID) | INTRAMUSCULAR | Status: AC
  Administered 2014-07-15: 15 mg via INTRAVENOUS
  Filled 2014-07-15: qty 1

## 2014-07-15 NOTE — Progress Notes (Signed)
Pt with nosebleed. Pt is sitting up and has ice applied. Paged MD on call to inform. MD suggested to pull NG tube and pack with gauze and keep pt head elevated. Will continue to monitor pt.

## 2014-07-15 NOTE — Progress Notes (Signed)
Patient ID: Richard Middleton, male   DOB: 10-24-1945, 69 y.o.   MRN: [id number]     [facility]      [address] [place], [place] [address]    Phone: [phone] FAX: [phone]     Subjective: Nauseated, NGT clamped.  Passing gas.  C/o pain.  No vomiting.  Afebrile.    Objective:  Vital signs:  Filed Vitals:   07/14/14 0557 07/14/14 1438 07/14/14 2155 07/15/14 0610  BP: 140/72 145/71 140/78 150/78  Pulse: 76 65 70 68  Temp: 98.4 F (36.9 C) 98.7 F (37.1 C) 98.5 F (36.9 C) 98.7 F (37.1 C)  TempSrc: Oral Oral Oral Oral  Resp: '20 20 20 20  ' Height:      Weight:      SpO2: 99% 94% 96% 97%    Last BM Date: 07/11/14  Intake/Output   Yesterday:  03/31 0701 - 04/01 0700 In: 2000 [I.V.:2000] Out: 2100 [Urine:1800; Emesis/NG output:300] This shift:    I/O last 3 completed shifts: In: 20 [I.V.:3200] Out: 7893 [Urine:4150; Emesis/NG output:500]      Physical Exam: General: Pt awake/alert/oriented x4 in no acute distress Chest: cta. No chest wall pain w good excursion CV: Pulses intact. Regular rhythm MS: Normal AROM mjr joints. No obvious deformity Abdomen: Soft. Nondistended. +BS. Dressing removed, staples in place, approximated wound edges. No evidence of peritonitis. No incarcerated hernias. Ext: SCDs BLE. No mjr edema. No cyanosis Skin: No petechiae / purpura   Problem List:   Active Problems:   Small bowel obstruction    Results:   Labs: Results for orders placed or performed during the hospital encounter of 07/11/14 (from the past 48 hour(s))  MRSA PCR Screening     Status: None   Collection Time: 07/13/14 10:52 AM  Result Value Ref Range   MRSA by PCR NEGATIVE NEGATIVE    Comment:        The GeneXpert MRSA Assay (FDA approved for NASAL specimens only), is one component of a comprehensive MRSA colonization surveillance program. It is not intended to diagnose MRSA infection nor to  guide or monitor treatment for MRSA infections.   CBC     Status: Abnormal   Collection Time: 07/15/14  5:05 AM  Result Value Ref Range   WBC 5.1 4.0 - 10.5 K/uL   RBC 4.48 4.22 - 5.81 MIL/uL   Hemoglobin 12.0 (L) 13.0 - 17.0 g/dL   HCT 38.1 (L) 39.0 - 52.0 %   MCV 85.0 78.0 - 100.0 fL   MCH 26.8 26.0 - 34.0 pg   MCHC 31.5 30.0 - 36.0 g/dL   RDW 13.3 11.5 - 15.5 %   Platelets 154 150 - 400 K/uL  Basic metabolic panel     Status: Abnormal   Collection Time: 07/15/14  5:05 AM  Result Value Ref Range   Sodium 137 135 - 145 mmol/L   Potassium 3.6 3.5 - 5.1 mmol/L   Chloride 103 96 - 112 mmol/L   CO2 29 19 - 32 mmol/L   Glucose, Bld 97 70 - 99 mg/dL   BUN 17 6 - 23 mg/dL   Creatinine, Ser 0.99 0.50 - 1.35 mg/dL   Calcium 8.2 (L) 8.4 - 10.5 mg/dL   GFR calc non Af Amer 83 (L) >90 mL/min   GFR calc Af Amer >90 >90 mL/min    Comment: (NOTE) The eGFR has been calculated using the CKD EPI equation. This calculation has not been  validated in all clinical situations. eGFR's persistently <90 mL/min signify possible Chronic Kidney Disease.    Anion gap 5 5 - 15    Imaging / Studies: Dg Abd Portable 1v-small Bowel Obstruction Protocol-24 Hr Delay  07/13/2014   CLINICAL DATA:  Small bowel obstruction.  EXAM: PORTABLE ABDOMEN - 1 VIEW  COMPARISON:  07/12/2014  FINDINGS: There is persistent marked dilatation of several mid small bowel loops. NG tube tip is in the body of the stomach. The colon is not distended.  IMPRESSION: Persistent small bowel obstruction. No change since the prior study.   Electronically Signed   By: Lorriane Shire M.D.   On: 07/13/2014 10:36    Medications / Allergies:  Scheduled Meds: . heparin  5,000 Units Subcutaneous 3 times per day   Continuous Infusions: . sodium chloride 100 mL/hr (07/15/14 0608)   PRN Meds:.acetaminophen, HYDROcodone-acetaminophen, menthol-cetylpyridinium, morphine injection, ondansetron **OR** ondansetron (ZOFRAN) IV,  promethazine  Antibiotics: Anti-infectives    Start     Dose/Rate Route Frequency Ordered Stop   07/13/14 1117  [MAR Hold]  ceFAZolin (ANCEF) IVPB 2 g/50 mL premix     (MAR Hold since 07/13/14 1153)  Comments:  Pharmacy may adjust dosing strength, interval, or rate of medication as needed for optimal therapy for the patient  Send with patient on call to the OR.  Anesthesia to complete antibiotic administration <17mn [facility]   2 g 100 mL/hr over 30 Minutes Intravenous 30 min pre-op 07/13/14 1114 07/13/14 1242        Assessment/Plan SBO POD#2 exploratory laparotomy with LOA---Dr. TMarcello Moores-c/o gas pain, nauseated.  Will place him back to suction for now -mobilize -pain control -anti-emetics  -SCD/heparin -IVF -IS  EErby Pian ANP-BC [facility] Pager [phone](7A-4:30P)   07/15/2014

## 2014-07-15 NOTE — Anesthesia Postprocedure Evaluation (Signed)
  Anesthesia Post-op Note  Patient: Richard Middleton  Procedure(s) Performed: Procedure(s) (LRB): EXPLORATORY LAPAROTOMY lysis of adhesions (N/A)  Patient Location: PACU  Anesthesia Type: General  Level of Consciousness: awake and alert   Airway and Oxygen Therapy: Patient Spontanous Breathing  Post-op Pain: mild  Post-op Assessment: Post-op Vital signs reviewed, Patient's Cardiovascular Status Stable, Respiratory Function Stable, Patent Airway and No signs of Nausea or vomiting  Last Vitals:  Filed Vitals:   07/15/14 0610  BP: 150/78  Pulse: 68  Temp: 37.1 C  Resp: 20    Post-op Vital Signs: stable   Complications: No apparent anesthesia complications

## 2014-07-16 MED ORDER — KCL IN DEXTROSE-NACL 20-5-0.45 MEQ/L-%-% IV SOLN
INTRAVENOUS | Status: DC
  Administered 2014-07-16 – 2014-07-17 (×2): via INTRAVENOUS
  Filled 2014-07-16 (×3): qty 1000

## 2014-07-16 NOTE — Progress Notes (Signed)
Patient ID: Richard Middleton, male   DOB: 10-Jan-1946, 69 y.o.   MRN: [id number]     [facility]      [address] [place], [place] [address]    Phone: [phone] FAX: [phone]     Subjective: Having flatus.  No n/v.  Nosebleed last night.  NGT removed.  No pain or nausea this am.    Objective:  Vital signs:  Filed Vitals:   07/15/14 0610 07/15/14 1355 07/15/14 2303 07/16/14 0515  BP: 150/78 156/84 145/77 145/75  Pulse: 68 72 68 70  Temp: 98.7 F (37.1 C) 99.3 F (37.4 C) 99.1 F (37.3 C) 98.8 F (37.1 C)  TempSrc: Oral Oral Oral Oral  Resp: _0 Height:      Weight:      SpO2: 97% 97% 97% 98%    Last BM Date: 07/13/14  Intake/Output   Yesterday:  04/01 0701 - 04/02 0700 In: 2800 [I.V.:2800] Out: 1275 [Urine:1175; Emesis/NG output:100] This shift:      Physical Exam: General: Pt awake/alert/oriented x4 in no acute distress Abdomen: Soft.  Nondistended.  +BS.  Dressing is dry, tender around incision.   Ext:  SCDs BLE.  No mjr edema.  No cyanosis Skin: No petechiae / purpura   Problem List:   Active Problems:   Small bowel obstruction    Results:   Labs: Results for orders placed or performed during the hospital encounter of 07/11/14 (from the past 48 hour(s))  CBC     Status: Abnormal   Collection Time: 07/15/14  5:05 AM  Result Value Ref Range   WBC 5.1 4.0 - 10.5 K/uL   RBC 4.48 4.22 - 5.81 MIL/uL   Hemoglobin 12.0 (L) 13.0 - 17.0 g/dL   HCT 38.1 (L) 39.0 - 52.0 %   MCV 85.0 78.0 - 100.0 fL   MCH 26.8 26.0 - 34.0 pg   MCHC 31.5 30.0 - 36.0 g/dL   RDW 13.3 11.5 - 15.5 %   Platelets 154 150 - 400 K/uL  Basic metabolic panel     Status: Abnormal   Collection Time: 07/15/14  5:05 AM  Result Value Ref Range   Sodium 137 135 - 145 mmol/L   Potassium 3.6 3.5 - 5.1 mmol/L   Chloride 103 96 - 112 mmol/L   CO2 29 19 - 32 mmol/L   Glucose, Bld 97 70 - 99 mg/dL   BUN 17 6 - 23 mg/dL   Creatinine, Ser 0.99 0.50 - 1.35 mg/dL   Calcium 8.2 (L) 8.4 - 10.5 mg/dL   GFR calc non Af Amer 83 (L) >90 mL/min   GFR calc Af Amer >90 >90 mL/min    Comment: (NOTE) The eGFR has been calculated using the CKD EPI equation. This calculation has not been validated in all clinical situations. eGFR's persistently <90 mL/min signify possible Chronic Kidney Disease.    Anion gap 5 5 - 15    Imaging / Studies: No results found.  Medications / Allergies:  Scheduled Meds: . heparin  5,000 Units Subcutaneous 3 times per day  . ketorolac  15 mg Intravenous 3 times per day  . pantoprazole (PROTONIX) IV  40 mg Intravenous Daily   Continuous Infusions: . sodium chloride 100 mL/hr at 07/16/14 0600   PRN Meds:.acetaminophen, HYDROcodone-acetaminophen, menthol-cetylpyridinium, morphine injection, ondansetron **OR** ondansetron (ZOFRAN) IV, promethazine  Antibiotics: Anti-infectives    Start     Dose/Rate Route Frequency Ordered Stop  07/13/14 1117  [MAR Hold]  ceFAZolin (ANCEF) IVPB 2 g/50 mL premix     (MAR Hold since 07/13/14 1153)  Comments:  Pharmacy may adjust dosing strength, interval, or rate of medication as needed for optimal therapy for the patient  Send with patient on call to the OR.  Anesthesia to complete antibiotic administration <54mn [facility]   2 g 100 mL/hr over 30 Minutes Intravenous 30 min pre-op 07/13/14 1114 07/13/14 1242        Assessment/Plan SBO POD#3 exploratory laparotomy with LOA---Dr. TMarcello Moores-mobilize -Cont NPO with sips of clears today -IV pain meds, add PO meds when tolerating a diet -SCD/heparin -IVF -[place] MD  [facility]    07/16/2014 10:05 AM

## 2014-07-16 NOTE — Progress Notes (Signed)
Clarified with Dr. Marcello Moores whether ok to give SQ heparin. Md made aware of pt's nosebleed last evening and removal of NGT. No bleeding noted today. Night RN reported orders to hold med yet order in EPIC to give. Dr. Marcello Moores instructed to continue administration of medication as ordered.

## 2014-07-17 MED ORDER — ACETAMINOPHEN 325 MG PO TABS
325.0000 mg | ORAL_TABLET | Freq: Four times a day (QID) | ORAL | Status: DC | PRN
  Administered 2014-07-18 (×2): 650 mg via ORAL
  Filled 2014-07-17 (×2): qty 2

## 2014-07-17 MED ORDER — HYDROCODONE-ACETAMINOPHEN 5-325 MG PO TABS: 1.0000 | ORAL_TABLET | ORAL | Status: DC | PRN

## 2014-07-17 NOTE — Progress Notes (Signed)
Patient ID: Richard Middleton, male   DOB: July 11, 1945, 69 y.o.   MRN: [id number]     [facility]      [address] [place], [place] [address]    Phone: [phone] FAX: [phone]     Subjective: Having flatus and BM's.  No n/v.  No pain or nausea this am.  Ambulating well  Objective:  Vital signs:  Filed Vitals:   07/16/14 1351 07/16/14 2125 07/17/14 0205 07/17/14 0625  BP: 140/81 142/76  131/70  Pulse: 64 64  61  Temp: 98.7 F (37.1 C) 98.8 F (37.1 C)  98.2 F (36.8 C)  TempSrc: Oral Oral  Oral  Resp: 18 18  18   Height:      Weight:      SpO2: 97% 98% 98% 98%    Last BM Date: 07/16/14  Intake/Output   Yesterday:  04/02 0701 - 04/03 0700 In: 3000 [P.O.:600; I.V.:2400] Out: 2450 [Urine:2450] This shift:      Physical Exam: General: Pt awake/alert/oriented x4 in no acute distress Abdomen: Soft.  Nondistended.  +BS.  Dressing is dry, tender around incision.   Ext:  SCDs BLE.  No mjr edema.  No cyanosis Skin: No petechiae / purpura   Problem List:   Active Problems:   Small bowel obstruction    Results:   Labs: No results found for this or any previous visit (from the past 48 hour(s)).  Imaging / Studies: No results found.  Medications / Allergies:  Scheduled Meds: . heparin  5,000 Units Subcutaneous 3 times per day  . pantoprazole (PROTONIX) IV  40 mg Intravenous Daily   Continuous Infusions:   PRN Meds:.acetaminophen, HYDROcodone-acetaminophen, menthol-cetylpyridinium, morphine injection, ondansetron **OR** ondansetron (ZOFRAN) IV, promethazine  Antibiotics: Anti-infectives    Start     Dose/Rate Route Frequency Ordered Stop   07/13/14 1117  [MAR Hold]  ceFAZolin (ANCEF) IVPB 2 g/50 mL premix     (MAR Hold since 07/13/14 1153)  Comments:  Pharmacy may adjust dosing strength, interval, or rate of medication as needed for optimal therapy for the patient  Send with patient on call to the OR.   Anesthesia to complete antibiotic administration <22min prior to incision per [facility].   2 g 100 mL/hr over 30 Minutes Intravenous 30 min pre-op 07/13/14 1114 07/13/14 1242        Assessment/Plan SBO POD#4 exploratory laparotomy with LOA---Dr. Marcello Moores -mobilize -Advance diet as tolerated today - PO narcotics when tolerating a diet -SCD/heparin -IVF -IS -possible d/c tom  Rosario Adie, MD  [facility]    07/17/2014 9:32 AM

## 2014-07-18 ENCOUNTER — Encounter: Payer: Self-pay | Admitting: General Surgery

## 2014-07-18 MED ORDER — ACETAMINOPHEN 325 MG PO TABS: 325.0000 mg | ORAL_TABLET | Freq: Four times a day (QID) | ORAL | Status: DC | PRN

## 2014-07-18 MED ORDER — HYDROCODONE-ACETAMINOPHEN 5-325 MG PO TABS: 1.0000 | ORAL_TABLET | ORAL | Status: DC | PRN

## 2014-07-18 NOTE — Discharge Instructions (Signed)
[facility] [phone]  OPEN ABDOMINAL SURGERY: POST OP INSTRUCTIONS  Always review your discharge instruction sheet given to you by the facility where your surgery was performed.  IF YOU HAVE DISABILITY OR FAMILY LEAVE FORMS, YOU MUST BRING THEM TO THE OFFICE FOR PROCESSING.  PLEASE DO NOT GIVE THEM TO YOUR DOCTOR.  1. A prescription for pain medication may be given to you upon discharge.  Take your pain medication as prescribed, if needed.  If narcotic pain medicine is not needed, then you may take acetaminophen (Tylenol) or ibuprofen (Advil) as needed. 2. Take your usually prescribed medications unless otherwise directed. 3. If you need a refill on your pain medication, please contact your pharmacy. They will contact our office to request authorization.  Prescriptions will not be filled after 5pm or on week-ends. 4. You should follow a light diet the first few days after arrival home, such as soup and crackers, pudding, etc.unless your doctor has advised otherwise. A high-fiber, low fat diet can be resumed as tolerated.   Be sure to include lots of fluids daily. Most patients will experience some swelling and bruising on the chest and neck area.  Ice packs will help.  Swelling and bruising can take several days to resolve 5. Most patients will experience some swelling and bruising in the area of the incision. Ice pack will help. Swelling and bruising can take several days to resolve..  6. It is common to experience some constipation if taking pain medication after surgery.  Increasing fluid intake and taking a stool softener will usually help or prevent this problem from occurring.  A mild laxative (Milk of Magnesia or Miralax) should be taken according to package directions if there are no bowel movements after 48 hours. 7.  You may have steri-strips (small skin tapes) in place directly over the incision.  These strips should be left on the skin for 7-10 days.  If your  surgeon used skin glue on the incision, you may shower in 24 hours.  The glue will flake off over the next 2-3 weeks.  Any sutures or staples will be removed at the office during your follow-up visit. You may find that a light gauze bandage over your incision may keep your staples from being rubbed or pulled. You may shower and replace the bandage daily. 8. ACTIVITIES:  You may resume regular (light) daily activities beginning the next day--such as daily self-care, walking, climbing stairs--gradually increasing activities as tolerated.  You may have sexual intercourse when it is comfortable.  Refrain from any heavy lifting or straining until approved by your doctor. a. You may drive when you no longer are taking prescription pain medication, you can comfortably wear a seatbelt, and you can safely maneuver your car and apply brakes b. Return to Work: ___________________________________ 9. You should see your doctor in the office for a follow-up appointment approximately two weeks after your surgery.  Make sure that you call for this appointment within a day or two after you arrive home to insure a convenient appointment time. OTHER INSTRUCTIONS:  _____________________________________________________________ _____________________________________________________________  WHEN TO CALL YOUR DOCTOR: 1. Fever over 101.0 2. Inability to urinate 3. Nausea and/or vomiting 4. Extreme swelling or bruising 5. Continued bleeding from incision. 6. Increased pain, redness, or drainage from the incision. 7. Difficulty swallowing or breathing 8. Muscle cramping or spasms. 9. Numbness or tingling in hands or feet or around lips.  The clinic staff is available to   answer your questions during regular business hours.  Please don't hesitate to call and ask to speak to one of the nurses if you have concerns.  For further questions, please visit [email or web address]   

## 2014-07-18 NOTE — Discharge Summary (Signed)
Physician Discharge Summary  Patient ID: Richard Middleton MRN: [id number] DOB/AGE: 68-Apr-1947 68 y.o.  Admit date: 07/11/2014 Discharge date: 07/18/2014  Admission Diagnoses:  SBO  Discharge Diagnoses:  SBO Prior SBR/ileostomy and closure of ileostomy   Active Problems:   Small bowel obstruction   PROCEDURES: S/p EXPLORATORY LAPAROTOMY lysis of adhesions 2/72/53, [facility] Course: Patient is a pleasant 69 year old male who has a history of an initial presentation of small bowel obstruction without previous abdominal surgery in 2005. He underwent exploratory laparotomy at that time by me apparent findings of congenital adhesions per the patient although I do not specifically recall and do not have the operative report. Since that timehe has had at least 3 subsequent admissions for small bowel obstruction, the last in 2012. Each time this has resolved quickly with NG suction and bowel rest without surgery. The patient has done well over the last several years but this morning at about 1:30, now almost 24 hours ago he had the fairly rapid onset of aching and crampy pain in his mid abdomen and radiating somewhat over to the right side. This was fairly severe and associated with nausea and vomiting. He presented to the emergency department where CT scan as below showed evidence of small bowel obstruction with questionable closed loop obstruction. The patient was admitted by the hospitalist service and NG tube placed. As I'm seeing him this evening he states he is definitely feeling better and currently denies any pain. He has had no flatus or bowel movement since onset of the pain. He admitted and followed by our service.  He underwent SBO protocol with Gastrografin.  Post procedure films showed the contrast did not make it into the colon and he was taken for surgery on 07/13/14 by Dr. Marcello Moores.  He has some post op ileus, but his diet has been advanced, he is having bowel movements, and  doing well with a soft diet.  We plan discharge today.  He is taking mostly Tylenol for pain.  Staples out later this week in our office and follow up in 2 weeks with Dr. Marcello Moores.    Condition on d/c:  Improved  Disposition: 01-Home or Self Care     Medication List    TAKE these medications        acetaminophen 325 MG tablet  Commonly known as:  TYLENOL  Take 1-2 tablets (325-650 mg total) by mouth every 6 (six) hours as needed for mild pain, fever or headache.     famotidine 20 MG tablet  Commonly known as:  PEPCID  Take 20 mg by mouth daily.     HYDROcodone-acetaminophen 5-325 MG per tablet  Commonly known as:  NORCO/VICODIN  Take 1-2 tablets by mouth every 4 (four) hours as needed for moderate pain.     ondansetron 4 MG disintegrating tablet  Commonly known as:  ZOFRAN-ODT  Take 4 mg by mouth every 8 (eight) hours as needed for nausea or vomiting.     ranitidine 75 MG tablet  Commonly known as:  ZANTAC  Take 75 mg by mouth daily as needed for heartburn.     VITAMIN D PO  Take 1 capsule by mouth daily.           Follow-up Information    Follow up with [facility] [place] On 07/22/2014.   Why:  You will see the nurse to remove staples on 07/22/14 at 2:30 PM, be there 15 minutes early for check in.   Contact  information:   [address] [address] [phone]      Follow up with Rosario Adie., MD On 7/74/1287.   Specialty:  General Surgery   Why:  Your appointment is at 10 AM, be there 30 minutes early for check in.   Contact information:   [address] [place] [address] [phone]       Follow up with Drema Pry, DO.   Specialty:  Internal Medicine   Why:  Let them know you had surgery and follow up as needed.   Contact information:   [place] [address] [phone]       Signed: Earnstine Regal 07/18/2014, 3:48 PM

## 2014-07-18 NOTE — Progress Notes (Signed)
5 Days Post-Op  Subjective: He has had 3 Bm's and is taking a soft diet.  I think he can go home after Dr. Marlou Starks see's him.  Objective: Vital signs in last 24 hours: Temp:  [98.2 F (36.8 C)-98.6 F (37 C)] 98.2 F (36.8 C) (04/04 0520) Pulse Rate:  [62-69] 69 (04/04 0520) Resp:  [18] 18 (04/04 0520) BP: (107-138)/(68-71) 107/71 mmHg (04/04 0520) SpO2:  [96 %-98 %] 96 % (04/04 0520) Last BM Date: 07/17/14 260 PO this AM recorded 1 Bm yesterday Afebrile, VSS Labs OK soft diet started   Intake/Output from previous day: 04/03 0701 - 04/04 0700 In: 400 [I.V.:400] Out: 350 [Urine:350] Intake/Output this shift: Total I/O In: 260 [P.O.:260] Out: -   General appearance: alert, cooperative and no distress Resp: clear to auscultation bilaterally GI: soft, sore, wound looks fine.  +BS, flatus and BM.  Lab Results:  No results for input(s): WBC, HGB, HCT, PLT in the last 72 hours.  BMET No results for input(s): NA, K, CL, CO2, GLUCOSE, BUN, CREATININE, CALCIUM in the last 72 hours. PT/INR No results for input(s): LABPROT, INR in the last 72 hours.   Recent Labs Lab 07/11/14 1312  AST 28  ALT 26  ALKPHOS 96  BILITOT 1.0  PROT 7.6  ALBUMIN 4.2     Lipase     Component Value Date/Time   LIPASE 23 07/11/2014 1312     Studies/Results: No results found.  Medications: . heparin  5,000 Units Subcutaneous 3 times per day  . pantoprazole (PROTONIX) IV  40 mg Intravenous Daily    Assessment/Plan SBO S/p EXPLORATORY LAPAROTOMY lysis of adhesions 0/45/40, Dr.Alicia Thomas Prior SBR/ileostomy and closure of ileostomy DVT:  Heparin/SCD No antibiotics   Plan:  I think he can go home after lunch.  I will get him a follow up staple removal and then follow up with Dr. Marcello Moores.     LOS: 7 days    Kiala Faraj 07/18/2014

## 2014-08-16 ENCOUNTER — Telehealth: Payer: Self-pay | Admitting: Internal Medicine

## 2014-08-16 NOTE — Telephone Encounter (Signed)
Pt called to let Dr Shawna Orleans know that he had surgery at [facility] and all is well.

## 2014-08-16 NOTE — Telephone Encounter (Signed)
FYI

## 2014-08-22 ENCOUNTER — Telehealth: Payer: Self-pay | Admitting: Internal Medicine

## 2014-08-22 NOTE — Telephone Encounter (Signed)
Pt aware and will try them again.

## 2014-08-22 NOTE — Telephone Encounter (Signed)
Pt states dr Shawna Orleans sent him for a lesion removal several yrs ago.(>3)  pt has another lesion on his face and would like to know if dr Shawna Orleans remembers who he would have sent him to, pt tried the 3 on our referral list and they were not the ones

## 2014-08-22 NOTE — Telephone Encounter (Signed)
See if it was Dr. Allyson Sabal

## 2014-10-05 DIAGNOSIS — D485 Neoplasm of uncertain behavior of skin: Secondary | ICD-10-CM | POA: Diagnosis not present

## 2015-07-14 ENCOUNTER — Other Ambulatory Visit: Payer: Commercial Managed Care - HMO

## 2015-07-21 ENCOUNTER — Encounter: Payer: Commercial Managed Care - HMO | Admitting: Internal Medicine

## 2015-08-09 ENCOUNTER — Other Ambulatory Visit: Payer: Commercial Managed Care - HMO

## 2015-08-09 ENCOUNTER — Other Ambulatory Visit (INDEPENDENT_AMBULATORY_CARE_PROVIDER_SITE_OTHER): Payer: Commercial Managed Care - HMO

## 2015-08-09 DIAGNOSIS — Z0000 Encounter for general adult medical examination without abnormal findings: Secondary | ICD-10-CM

## 2015-08-09 DIAGNOSIS — Z125 Encounter for screening for malignant neoplasm of prostate: Secondary | ICD-10-CM

## 2015-08-09 LAB — LIPID PANEL
CHOLESTEROL: 138 mg/dL (ref 0–200)
HDL: 52 mg/dL (ref >39.00)
LDL Cholesterol: 77 mg/dL (ref 0–99)
NonHDL: 85.57
Total CHOL/HDL Ratio: 3
Triglycerides: 45 mg/dL (ref 0.0–149.0)
VLDL: 9 mg/dL (ref 0.0–40.0)

## 2015-08-09 LAB — POC URINALSYSI DIPSTICK (AUTOMATED)
Bilirubin, UA: NEGATIVE
Glucose, UA: NEGATIVE
Ketones, UA: NEGATIVE
LEUKOCYTES UA: NEGATIVE
Nitrite, UA: NEGATIVE
Protein, UA: NEGATIVE
Spec Grav, UA: 1.025
UROBILINOGEN UA: 0.2
pH, UA: 6.5

## 2015-08-09 LAB — CBC WITH DIFFERENTIAL/PLATELET
BASOS ABS: 0 10*3/uL (ref 0.0–0.1)
Basophils Relative: 0.3 % (ref 0.0–3.0)
Eosinophils Absolute: 0.1 10*3/uL (ref 0.0–0.7)
Eosinophils Relative: 2.4 % (ref 0.0–5.0)
HCT: 41.1 % (ref 39.0–52.0)
Hemoglobin: 13.8 g/dL (ref 13.0–17.0)
LYMPHS ABS: 1.6 10*3/uL (ref 0.7–4.0)
Lymphocytes Relative: 35.5 % (ref 12.0–46.0)
MCHC: 33.5 g/dL (ref 30.0–36.0)
MCV: 81.7 fl (ref 78.0–100.0)
MONO ABS: 0.3 10*3/uL (ref 0.1–1.0)
Monocytes Relative: 6.1 % (ref 3.0–12.0)
NEUTROS PCT: 55.7 % (ref 43.0–77.0)
Neutro Abs: 2.5 10*3/uL (ref 1.4–7.7)
PLATELETS: 196 10*3/uL (ref 150.0–400.0)
RBC: 5.04 Mil/uL (ref 4.22–5.81)
RDW: 14.1 % (ref 11.5–15.5)
WBC: 4.6 10*3/uL (ref 4.0–10.5)

## 2015-08-09 LAB — PSA: PSA: 0.26 ng/mL (ref 0.10–4.00)

## 2015-08-09 LAB — BASIC METABOLIC PANEL
BUN: 18 mg/dL (ref 6–23)
CALCIUM: 9.6 mg/dL (ref 8.4–10.5)
CO2: 29 mEq/L (ref 19–32)
CREATININE: 1.17 mg/dL (ref 0.40–1.50)
Chloride: 105 mEq/L (ref 96–112)
GFR: 79.51 mL/min (ref >60.00)
Glucose, Bld: 96 mg/dL (ref 70–99)
Potassium: 4.1 mEq/L (ref 3.5–5.1)
Sodium: 141 mEq/L (ref 135–145)

## 2015-08-09 LAB — HEPATIC FUNCTION PANEL
ALK PHOS: 84 U/L (ref 39–117)
ALT: 22 U/L (ref 0–53)
AST: 23 U/L (ref 0–37)
Albumin: 4.1 g/dL (ref 3.5–5.2)
Bilirubin, Direct: 0.1 mg/dL (ref 0.0–0.3)
Total Bilirubin: 0.4 mg/dL (ref 0.2–1.2)
Total Protein: 7.1 g/dL (ref 6.0–8.3)

## 2015-08-09 LAB — TSH: TSH: 1.53 u[IU]/mL (ref 0.35–4.50)

## 2015-08-11 ENCOUNTER — Encounter: Payer: Commercial Managed Care - HMO | Admitting: Internal Medicine

## 2015-08-15 ENCOUNTER — Encounter: Payer: Self-pay | Admitting: *Deleted

## 2015-08-18 ENCOUNTER — Encounter: Payer: Self-pay | Admitting: Family Medicine

## 2015-08-18 ENCOUNTER — Ambulatory Visit (INDEPENDENT_AMBULATORY_CARE_PROVIDER_SITE_OTHER): Payer: Commercial Managed Care - HMO | Admitting: Family Medicine

## 2015-08-18 VITALS — BP 110/80 | HR 73 | Temp 98.1°F | Ht 70.5 in | Wt 176.0 lb

## 2015-08-18 DIAGNOSIS — Z0000 Encounter for general adult medical examination without abnormal findings: Secondary | ICD-10-CM | POA: Diagnosis not present

## 2015-08-18 DIAGNOSIS — Z23 Encounter for immunization: Secondary | ICD-10-CM

## 2015-08-18 NOTE — Progress Notes (Signed)
   Subjective:    Patient ID: Richard Middleton, male    DOB: 1945/08/29, 70 y.o.   MRN: [id number]  HPI Patient seen for physical exam. Generally very healthy. Takes no medications. No history of Prevnar 13. Other immunizations up-to-date. History of small bowel obstruction 2. Takes no regular medications. Nonsmoker. Former Engineer, manufacturing runner  Past Medical History  Diagnosis Date  . History of small bowel obstruction     x2  . Abnormal glucose    Past Surgical History  Procedure Laterality Date  . Resection small bowel / closure ileostomy    . Laparotomy N/A 07/13/2014    Procedure: EXPLORATORY LAPAROTOMY lysis of adhesions;  Surgeon: Leighton Ruff, MD;  Location: [facility];  Service: General;  Laterality: N/A;    reports that he has quit smoking. He does not have any smokeless tobacco history on file. He reports that he does not drink alcohol or use illicit drugs. family history includes Arthritis in his mother. No Known Allergies    Review of Systems  Constitutional: Negative for fever, activity change, appetite change and fatigue.  HENT: Negative for congestion, ear pain and trouble swallowing.   Eyes: Negative for pain and visual disturbance.  Respiratory: Negative for cough, shortness of breath and wheezing.   Cardiovascular: Negative for chest pain and palpitations.  Gastrointestinal: Negative for nausea, vomiting, abdominal pain, diarrhea, constipation, blood in stool, abdominal distention and rectal pain.  Genitourinary: Negative for dysuria, hematuria and testicular pain.  Musculoskeletal: Negative for joint swelling and arthralgias.  Skin: Negative for rash.  Neurological: Negative for dizziness, syncope and headaches.  Hematological: Negative for adenopathy.  Psychiatric/Behavioral: Negative for confusion and dysphoric mood.       Objective:   Physical Exam  Constitutional: He is oriented to person, place, and time. He appears well-developed and well-nourished. No  distress.  HENT:  Head: Normocephalic and atraumatic.  Right Ear: External ear normal.  Left Ear: External ear normal.  Mouth/Throat: Oropharynx is clear and moist.  Eyes: Conjunctivae and EOM are normal. Pupils are equal, round, and reactive to light.  Neck: Normal range of motion. Neck supple. No thyromegaly present.  Cardiovascular: Normal rate, regular rhythm and normal heart sounds.   No murmur heard. Pulmonary/Chest: No respiratory distress. He has no wheezes. He has no rales.  Abdominal: Soft. Bowel sounds are normal. He exhibits no distension and no mass. There is no tenderness. There is no rebound and no guarding.  Musculoskeletal: He exhibits no edema.  Lymphadenopathy:    He has no cervical adenopathy.  Neurological: He is alert and oriented to person, place, and time. He displays normal reflexes. No cranial nerve deficit.  Skin: No rash noted.  Psychiatric: He has a normal mood and affect.          Assessment & Plan:  Physical exam. Labs reviewed. No major concerns. Prevnar 13 given. Other immunizations up-to-date. We performed Mini-Mental Status exam and he scored 29 out of 30 Will need tetanus and colonoscopy by next year.

## 2015-08-18 NOTE — Progress Notes (Signed)
Pre visit review using our clinic review tool, if applicable. No additional management support is needed unless otherwise documented below in the visit note. 

## 2015-08-18 NOTE — Addendum Note (Signed)
Addended by: Elio Forget on: 08/18/2015 01:01 PM   Modules accepted: Orders, SmartSet

## 2015-11-22 ENCOUNTER — Ambulatory Visit (INDEPENDENT_AMBULATORY_CARE_PROVIDER_SITE_OTHER): Payer: Commercial Managed Care - HMO | Admitting: Family Medicine

## 2015-11-22 ENCOUNTER — Encounter: Payer: Self-pay | Admitting: Family Medicine

## 2015-11-22 VITALS — BP 122/70 | HR 60 | Ht 70.5 in | Wt 173.1 lb

## 2015-11-22 DIAGNOSIS — M5442 Lumbago with sciatica, left side: Secondary | ICD-10-CM | POA: Diagnosis not present

## 2015-11-22 DIAGNOSIS — M5410 Radiculopathy, site unspecified: Secondary | ICD-10-CM | POA: Diagnosis not present

## 2015-11-22 MED ORDER — PREDNISONE 20 MG PO TABS: ORAL_TABLET | ORAL | 0 refills | Status: AC

## 2015-11-22 MED ORDER — MELOXICAM 15 MG PO TABS: 15.0000 mg | ORAL_TABLET | Freq: Every day | ORAL | 0 refills | Status: AC

## 2015-11-22 NOTE — Progress Notes (Signed)
HPI:  ACUTE VISIT:  Chief Complaint  Patient presents with  . Back Pain    about 3-4 weeks ago. Mainly in the morning, patient will do some stretches and also put some cream on the area. Pain travels down leg, uses ibuprofen as well. Patient feels okay after 730 or so for the rest of the day.    Richard Middleton is a 70 y.o. male, who is here today complaining of 3-4 weeks of constant left lower back pain, he points to left ischial area, attributed to having his wallet  in his left pocket for long period of time.  He denies any recent injury or unusual level of activity.  Pain is radiated from ischium to lower back and LLE, tingling like, worse in the morning, 9/10 in intensity, with no associated LE numbness or burning, urinary incontinence or retention, stool incontinence, or saddle anesthesia. He has not identified exacerbation factors, pain is alleviated with ibuprofen and stretching exercises, it goes to 3/10. No rash or edema on area, fever, chills, or abnormal wt loss. No limitation of movement.  Prior Hx of back pain: Denies.   OTC medications: Ibuprofen 400 mg in the morning and topical oint.  Slowly getting better. He denies Hx of diabetes or other chronic medical problem.   Review of Systems  Constitutional: Negative for activity change, appetite change, chills, fatigue, fever and unexpected weight change.  HENT: Negative for mouth sores and sore throat.   Gastrointestinal: Negative for abdominal pain, blood in stool, nausea and vomiting.       No changes in bowel habits.  Genitourinary: Negative for decreased urine volume, dysuria, frequency, hematuria and urgency.  Musculoskeletal: Positive for back pain. Negative for arthralgias, gait problem, myalgias and neck pain.  Skin: Negative for color change and rash.  Neurological: Negative for syncope, weakness and headaches.  Psychiatric/Behavioral: Negative for confusion. The patient is not nervous/anxious.        Current Outpatient Prescriptions on File Prior to Visit  Medication Sig Dispense Refill  . Cholecalciferol (VITAMIN D PO) Take 1 capsule by mouth daily.     No current facility-administered medications on file prior to visit.      Past Medical History:  Diagnosis Date  . Abnormal glucose   . History of small bowel obstruction    x2   No Known Allergies  Social History   Social History  . Marital status: Married    Spouse name: N/A  . Number of children: N/A  . Years of education: N/A   Social History Main Topics  . Smoking status: Former Research scientist (life sciences)  . Smokeless tobacco: None  . Alcohol use No  . Drug use: No  . Sexual activity: Not Asked   Other Topics Concern  . None   Social History Narrative  . None    Vitals:   11/22/15 1004  BP: 122/70  Pulse: 60   Body mass index is 24.49 kg/m.   Physical Exam  Constitutional: He is oriented to person, place, and time. He appears well-developed and well-nourished. He does not appear ill. No distress.  HENT:  Head: Atraumatic.  Eyes: Conjunctivae are normal.  Cardiovascular:  Pulses:      Dorsalis pedis pulses are 2+ on the right side, and 2+ on the left side.       Posterior tibial pulses are 2+ on the right side, and 2+ on the left side.  Respiratory: No respiratory distress.  GI: Soft. He exhibits no  mass. There is no tenderness.  Musculoskeletal: He exhibits no edema or tenderness.  No significant deformity appreciated. No tenderness upon palpation of paraspinal muscles. No pain elicited with movement on exam table during examination. No local edema or erythema appreciated, no suspicious lesions. Hips range of motion otherwise normal and he does not elicit pain. Bilateral knee crepitus, no pain and normal range of motion.  Neurological: He is alert and oriented to person, place, and time. He has normal strength. No sensory deficit. Coordination and gait normal.  Reflex Scores:      Patellar reflexes are  2+ on the right side and 2+ on the left side.      Achilles reflexes are 2+ on the right side and 2+ on the left side. SLR negative bilateral.  Skin: Skin is warm. No rash noted. No erythema.  Psychiatric: He has a normal mood and affect.  Well groomed, good eye contact.      ASSESSMENT AND PLAN:     [name] was seen today for back pain.  Diagnoses and all orders for this visit:  Low back pain with radiation, left -     predniSONE (DELTASONE) 20 MG tablet; 3 tabs x 3 days , 2 tabs x 3 days, 1 tab x 3 d, and 1/2 tab x 3 d -     meloxicam (MOBIC) 15 MG tablet; Take 1 tablet (15 mg total) by mouth daily. -     Ambulatory referral to Physical Therapy  Radicular pain of left lower extremity -     predniSONE (DELTASONE) 20 MG tablet; 3 tabs x 3 days , 2 tabs x 3 days, 1 tab x 3 d, and 1/2 tab x 3 d -     Ambulatory referral to Physical Therapy   We discussed some treatment options, oral steroid as Prednisone is usually recommended; explained that recent studies do not seem to show any major difference in regard to prognosis. He agrees with trying prednisone taper. We discussed some side effects, recommended taking it with breakfast. Instead ibuprofen he agrees with trying Mobic daily for up to 10 days. Some side effects of NSAIDs were also discussed. I think he would benefit from physical therapy, appointment will be arranged. If not better in 4-6 weeks or if he gets worse he may need lumbar MRI. Follow-up as needed with PCP.     -Richard Middleton advised to return or notify a doctor immediately if symptoms worsen or persist or new concerns arise.       Betty G. Martinique, MD  [facility]. [place] office.

## 2015-11-22 NOTE — Progress Notes (Signed)
Pre visit review using our clinic review tool, if applicable. No additional management support is needed unless otherwise documented below in the visit note. 

## 2015-11-22 NOTE — Patient Instructions (Addendum)
  Richard Middleton I have seen you today for an acute visit because your primary care provider was not available. Monitor for signs of worsening symptoms and seek immediate medical attention if any concerning/warning symptom as we discussed. If symptoms are not resolved in 3 weeks you should schedule a follow up appointment with your doctor, before if symptoms get worse.      A few things to remember from today's visit:   Low back pain with radiation, left - Plan: predniSONE (DELTASONE) 20 MG tablet, meloxicam (MOBIC) 15 MG tablet, Ambulatory referral to Physical Therapy  Radicular pain of left lower extremity - Plan: predniSONE (DELTASONE) 20 MG tablet, Ambulatory referral to Physical Therapy Prednisone with breakfast and Mobic with lunch x 10 days max.   Please be sure medication list is accurate. If a new problem present, please set up appointment sooner than planned today.

## 2015-11-23 ENCOUNTER — Telehealth: Payer: Self-pay | Admitting: Family Medicine

## 2015-11-23 NOTE — Telephone Encounter (Signed)
Okay to change to ibuprofen?

## 2015-11-23 NOTE — Telephone Encounter (Signed)
Pt would like to know if he could substitute Rx MOBIC for a low dose of ibuprofen due to the side affect that [facility] has and do not want to take.  Pt would like to have a call back.

## 2015-11-24 ENCOUNTER — Ambulatory Visit: Payer: Commercial Managed Care - HMO | Admitting: Family Medicine

## 2015-11-24 NOTE — Telephone Encounter (Signed)
It is ok to change for OTC Ibuprofen, take it with food (400 mg) max tid. Ibuprofen and Mobic have similar side effects. Thanks, BJ

## 2015-11-24 NOTE — Telephone Encounter (Signed)
Left message for patient letting him know ibuprofen is okay to take instead of mobic.

## 2015-12-08 ENCOUNTER — Ambulatory Visit: Payer: Commercial Managed Care - HMO | Attending: Family Medicine | Admitting: Physical Therapy

## 2015-12-08 DIAGNOSIS — M25652 Stiffness of left hip, not elsewhere classified: Secondary | ICD-10-CM

## 2015-12-08 DIAGNOSIS — M6281 Muscle weakness (generalized): Secondary | ICD-10-CM

## 2015-12-08 DIAGNOSIS — M5416 Radiculopathy, lumbar region: Secondary | ICD-10-CM | POA: Insufficient documentation

## 2015-12-08 NOTE — Therapy (Signed)
[facility] [address], [place], [place], [address] Phone: [phone]   Fax:  [phone]  Physical Therapy Evaluation  Patient Details  Name: Richard Middleton MRN: [id number] Date of Birth: 09-30-1945 Referring Provider: Dr. Betty Martinique  Encounter Date: 12/08/2015      PT End of Session - 12/08/15 1032    Visit Number 1   Number of Visits 10   Date for PT Re-Evaluation 02/02/16   Authorization Type Medicare G codes;  KX at visit 15   PT Start Time 0935   PT Stop Time 1028   PT Time Calculation (min) 53 min   Activity Tolerance Patient tolerated treatment well      Past Medical History:  Diagnosis Date  . Abnormal glucose   . History of small bowel obstruction    x2    Past Surgical History:  Procedure Laterality Date  . LAPAROTOMY N/A 07/13/2014   Procedure: EXPLORATORY LAPAROTOMY lysis of adhesions;  Surgeon: Leighton Ruff, MD;  Location: [facility];  Service: General;  Laterality: N/A;  . RESECTION SMALL BOWEL / CLOSURE ILEOSTOMY      There were no vitals filed for this visit.       Subjective Assessment - 12/08/15 0940    Subjective 3-4 weeks ago, was sitting on thick wallet in left hip while driving to work.  Left buttock, posterior lateral thigh to lower leg stops at ankle.   Worse in AM.   Dull numbness remains throughout the day.   Pertinent History 2 small bowel surgeries   Limitations House hold activities;Sitting   How long can you sit comfortably? in comfortable chair can sit all day   Diagnostic tests none   Patient Stated Goals get back to normal run, walk, play sports   Currently in Pain? Yes   Pain Score 3    Pain Location Buttocks   Pain Orientation Left   Pain Type Acute pain   Pain Onset 1 to 4 weeks ago   Pain Frequency Constant   Aggravating Factors  sit on bar stool; can't run or play tennis   Pain Relieving Factors ice; Mal Amabile            [facility] PT Assessment -  12/08/15 0001      Assessment   Medical Diagnosis low back pain with radiation, left    Referring Provider Dr. Betty Martinique   Onset Date/Surgical Date --  3-4 weeks ago   Hand Dominance Right   Next MD Visit not scheduled   Prior Therapy frozen shoulder      Precautions   Precautions None     Restrictions   Weight Bearing Restrictions No     Balance Screen   Has the patient fallen in the past 6 months No   Has the patient had a decrease in activity level because of a fear of falling?  No   Is the patient reluctant to leave their home because of a fear of falling?  No     Home Environment   Living Environment Private residence   Type of [place] to enter   Home Layout Two level     Prior Function   Level of Independence Independent   Vocation Full time employment   Vocation Requirements moving around   Leisure tennis, yard work     Observation/Other Assessments   Focus on Therapeutic Outcomes (FOTO)  54% limitation     ROM /  Strength   AROM / PROM / Strength AROM;Strength     AROM   AROM Assessment Site Hip;Lumbar   Right/Left Hip Right;Left   Right Hip External Rotation  45   Right Hip Internal Rotation  10   Left Hip External Rotation  25   Left Hip Internal Rotation  10   Lumbar Flexion 70   Lumbar Extension 25   Lumbar - Right Side Bend 35   Lumbar - Left Side Bend 35     Strength   Strength Assessment Site Hip;Lumbar   Right/Left Hip Right;Left   Right Hip ADduction 4+/5   Left Hip ADduction 4-/5   Lumbar Flexion 4+/5   Lumbar Extension 4+/5     Flexibility   Soft Tissue Assessment /Muscle Length yes   Hamstrings 90 degrees bilaterally but sensation of pull/discomfort on left   Quadriceps decreased left hip flexor length     Palpation   Palpation comment minimal gluteal, piriformis                            PT Education - 12/08/15 1017    Education provided Yes   Education Details piriformis  stretching in supine;  sitting with lumbar roll (avoidance of sacral sitting);  basic self management    Person(s) Educated Patient   Methods Explanation;Demonstration;Handout   Comprehension Verbalized understanding;Returned demonstration          PT Short Term Goals - 12/08/15 1041      PT SHORT TERM GOAL #1   Title The patient will demonstrate compliance with basic self care strategies to promote healing      Time 4   Period Weeks   Status New     PT SHORT TERM GOAL #2   Title The patient will report a 30% improvement in morning symptoms   Time 4   Period Weeks   Status New           PT Long Term Goals - 12/08/15 1045      PT LONG TERM GOAL #1   Title The patient will be independent in safe self progression of HEP to promote further improvements in ROM and strength  02/02/16   Time 8   Period Weeks   Status New     PT LONG TERM GOAL #2   Title The patient will have increased left hip external rotation to 35 degrees needed for return to yard work and tennis   Time 8   Period Weeks   Status New     PT LONG TERM GOAL #3   Title Left hip abduction strength improved to 4+/5 needed for return to tennis   Time 8   Period Weeks   Status New     PT LONG TERM GOAL #4   Title Patient will have a 60% improvement in distal left LE symptoms with usual ADLs   Time 8   Period Weeks   Status New     PT LONG TERM GOAL #5   Title FOTO functional outcome score improved to 35% indicating improved function with less pain   Time 8   Period Weeks   Status New               Plan - 12/08/15 1033    Clinical Impression Statement The patient complains of a 3-4 week history of left buttock pain with numbness and pain radiating down the posterior-lateral thigh, posterior calf to the  ankle.  He states symptoms began from sitting on his thick wallet while driving to work.  Symptoms are constant but worsened in the morning.  He states he is unable to sit on a bar stool, run or  play tennis.    Full lumbar AROM with no change in symptoms with repeated movement testing of the lumbar spine.  Negative slump test.  Negative SLR.  Decreased left hip external rotation AROM 25 degrees (45 on right).  Endrange tightness with knee to chest and hamstring stretch.  Left hip abduction muscle weakness 4-/5 with pelvic drop with left single leg standing.  No tenderness over gluteals or piriformis muscles.   The patient reports he is limited in attendence to PT secondary to work.   The patient is of low evaluation complexity secondary to minimal co-morbidities and supportive home environment.     Rehab Potential Good   PT Frequency 1x / week   PT Duration 8 weeks   PT Treatment/Interventions ADLs/Self Care Home Management;Electrical Stimulation;Iontophoresis 4mg /ml Dexamethasone;Ultrasound;Moist Heat;Patient/family education;Therapeutic exercise;Manual techniques;Taping;Dry needling   PT Next Visit Plan review piriformis stretching;  add hamstring and hip flexor stretch to HEP;  add gluteal strengthening on left;  modalities and manual techniques as needed      Patient will benefit from skilled therapeutic intervention in order to improve the following deficits and impairments:  Decreased activity tolerance, Decreased range of motion, Decreased strength, Pain  Visit Diagnosis: Radiculopathy, lumbar region - Plan: PT plan of care cert/re-cert  Stiffness of left hip, not elsewhere classified - Plan: PT plan of care cert/re-cert  Muscle weakness (generalized) - Plan: PT plan of care cert/re-cert      G-Codes - [id number] [id number]    Functional Assessment Tool Used FOTO ; clinical judgement    Functional Limitation Mobility: Walking and moving around   Mobility: Walking and Moving Around Current Status [phone]) At least 40 percent but less than 60 percent impaired, limited or restricted   Mobility: Walking and Moving Around Goal Status [phone]) At least 20 percent but less than 40 percent  impaired, limited or restricted       Problem List Patient Active Problem List   Diagnosis Date Noted  . Small bowel obstruction ([place]) 07/11/2014  . Tendinitis of forearm 04/19/2013  . Preventative health care 01/24/2012  . LIPOMA 06/29/2010  . TINEA BARBAE 01/22/2010  . SMALL BOWEL OBSTRUCTION, HX OF 02/19/2007  Ruben Im, PT 12/08/15 10:52 AM Phone: [phone] Fax: [phone]  Alvera Singh 12/08/2015, 10:51 AM  [facility] [address] [place], [place], [address] Phone: [phone]   Fax:  [phone]  Name: Richard Middleton MRN: [id number] Date of Birth: Mar 08, 1946

## 2015-12-08 NOTE — Patient Instructions (Signed)
Stacy Simpson PT [facility] [address], [address] , [place] [address] Phone # [phone] Fax [phone]    

## 2015-12-21 NOTE — Progress Notes (Signed)
Subjective:   Richard Middleton is a 70 y.o. male who presents for Medicare Annual/Subsequent preventive examination.  HRA assessment completed during this visit with Richard Middleton  The Patient was informed that the wellness visit is to identify future health risk and educate and initiate measures that can reduce risk for increased disease through the lifespan.    NO ROS; Medicare Wellness Visit Working with sciatica;  Uses ice and rehab now;  Describes health as good, fair or great? Otherwise health is great   Social History   Social History  . Marital status: Married    Spouse name: N/A  . Number of children: N/A  . Years of education: N/A   Occupational History  . Not on file.   Social History Main Topics  . Smoking status: Former Smoker    Packs/day: 4.00    Types: Cigarettes    Start date: 04/15/1966    Quit date: 04/15/1970  . Smokeless tobacco: Not on file  . Alcohol use No  . Drug use: No  . Sexual activity: Not on file   Other Topics Concern  . Not on file   Social History Narrative  . No narrative on file   Updates:  Tobacco former smoker; remote smoking hx when young Grandmother had DM   ETOH; no alcohol   PMH;  SBO 3/16 / been resolved except for scar tissue x 1.70 yo  Abnormal glucose was elevated 2008; now down to 5.7  States he quit drinking sodas when he was told his BS was trending up  Lipids HDL 52; LDL 77; trig 45;  FBS 96   Medications reviewed for issues;   Vit D only   BMI: 22  Diet Eat regularly;  6:30 breakfast; cereal; bran;  9; fruit or bagel with cheese Waffles or [place] bacon Lunch; full meal; 2 to 3 veg, meat or starch Takes lunch every day Supper; little lighter than lunch starch, veg and meat   Exercise;  walks every day;  Go outside the plant on a 15 break and walk Did play tennis x 1 to 2 a month In the garden; loves being outside; states he is a Psychologist, sport and exercise;  Winter garden; broccoli, greens and plan collards    Fall hx; no    [place] off ladder x 2 and will not use again and Dr. Shawna Orleans told him not to use again   Given education on "Fall Prevention in the Home" for more safety tips the patient can apply as appropriate.   Personal safety issues reviewed:  1.  for risk such as safe community; multi level  2.  smoke detector/ yes 3.  firearms safety if applicable  4. protection when in the sun; try to wear hats  5. driving safety for seniors or any recent accidents. no   Depression; anxiety or mood issues assessed   Cognitive screen completed; MMSE documented or assessed for failures or issues with the AD8 screen below:   Ad8 score reviewed for issues;  Issues making decisions; no  Less interest in hobbies / activities" no  Repeats questions, stories; family complaining: NO  Trouble using ordinary gadgets; microwave; computer: no  Forgets the month or year: no  Mismanaging finances: no  Missing apt: no but does write them down  Daily problems with thinking of memory NO Ad8 score is 0  MMSE not appropriate unless AD8 score is > 2   AD none listed  Advanced Directive reviewed for completion or educated regarding Gershon Mussel  [facility] form; the electing a health care agent and completing the Living Will.   Hearing 4000 hz both ears   Vision; will have to schedule   Health Maintenance Due  Topic Date Due  . Hepatitis C Screening  1945-10-25  . INFLUENZA VACCINE  11/14/2015     Assessed for Preventive Heath Gaps and completion;  Eye exam; over 2 years  Ago [facility]; on [place]; will try to schedule this year Colonoscopy 12/2006; due 12/2016 EKG  06/2010  Future due dates reviewed with the patient for accuracy.    Established and updated Risk reviewed and appropriate referral made or health recommendations as appropriate based on individual needs and choices;   Current Care Team reviewed and updated     Cardiac Risk Factors include: advanced age (>10men, >10 women);male gender     Objective:     Vitals: BP 120/80   Pulse 72   Ht 6\' 1"  (1.854 m)   Wt 173 lb (78.5 kg)   SpO2 98%   BMI 22.82 kg/m   Body mass index is 22.82 kg/m.  Tobacco History  Smoking Status  . Former Smoker  . Packs/day: 4.00  . Types: Cigarettes  . Start date: 04/15/1966  . Quit date: 04/15/1970  Smokeless Tobacco  . Not on file     Counseling given: Yes   Past Medical History:  Diagnosis Date  . Abnormal glucose   . History of small bowel obstruction    x2   Past Surgical History:  Procedure Laterality Date  . LAPAROTOMY N/A 07/13/2014   Procedure: EXPLORATORY LAPAROTOMY lysis of adhesions;  Surgeon: Leighton Ruff, MD;  Location: [facility];  Service: General;  Laterality: N/A;  . RESECTION SMALL BOWEL / CLOSURE ILEOSTOMY     Family History  Problem Relation Age of Onset  . Arthritis Mother    History  Sexual Activity  . Sexual activity: Not on file    Outpatient Encounter Prescriptions as of 12/22/2015  Medication Sig  . Cholecalciferol (VITAMIN D PO) Take 1 capsule by mouth daily.   No facility-administered encounter medications on file as of 12/22/2015.     Activities of Daily Living In your present state of health, do you have any difficulty performing the following activities: 12/22/2015  Hearing? N  Vision? N  Difficulty concentrating or making decisions? N  Walking or climbing stairs? N  Dressing or bathing? N  Doing errands, shopping? N  Preparing Food and eating ? N  Using the Toilet? N  In the past six months, have you accidently leaked urine? N  Do you have problems with loss of bowel control? N  Managing your Medications? N  Managing your Finances? N  Housekeeping or managing your Housekeeping? N  Some recent data might be hidden    Patient Care Team: Betty G Martinique, MD as PCP - General (Family Medicine)   Assessment:    Assessment included:  Review for health history  Self Assessment of health status; poor, fair, good or Great  Psychosocial risk; stress;  depression; pain; lack of support; lack of income to buy groceries, meds etc.  Behavioral risk addressed such as tobacco, ETOH; diet (metabolic syndrome) and exercise  Loss of ability to function independently with ADLs or IADLs  Risk for independent living   Risk for safety; Bathroom; falls or railing or a decline in gait; which include review of osteopenia   Review of over due preventive screens with plan to obtain or educate as appropriate   Changes  in mental status   Medication reconciliation, past medical history, social history, problem list and allergies were reviewed in detail with the patient  Goals were established with regard to weight loss, exercise, and diet in compliance with medications based on the patient individualized risk;   End of life planning was discussed.  Exercise Activities and Dietary recommendations Current Exercise Habits: Home exercise routine, Type of exercise: walking;strength training/weights, Time (Minutes): > 60, Frequency (Times/Week): 5, Weekly Exercise (Minutes/Week): 0  Goals    . Exercise 150 minutes per week (moderate activity)          Will consider the gym for strength training       Fall Risk Fall Risk  12/22/2015 07/01/2014 02/19/2013  Falls in the past year? No No No   Depression Screen PHQ 2/9 Scores 12/22/2015 07/01/2014 02/19/2013  PHQ - 2 Score 0 0 0    Cognitive Testing MMSE - Mini Mental State Exam 12/22/2015  Not completed: (No Data)  Ad8 score 0   Immunization History  Administered Date(s) Administered  . Influenza Split 01/24/2012  . Influenza Whole 01/12/2007, 01/05/2008, 02/01/2009, 01/09/2010  . Influenza, High Dose Seasonal PF 12/22/2015  . Influenza,inj,Quad PF,36+ Mos 01/11/2013  . Pneumococcal Conjugate-13 08/18/2015  . Pneumococcal Polysaccharide-23 01/24/2012  . Td 08/01/2006  . Zoster 07/21/2007   Screening Tests Health Maintenance  Topic Date Due  . Hepatitis C Screening  31-Jul-1945  . INFLUENZA  VACCINE  11/14/2015  . TETANUS/TDAP  07/31/2016  . COLONOSCOPY  12/26/2016  . ZOSTAVAX  Completed  . PNA vac Low Risk Adult  Completed      Plan:     Will order Hep c for next blood draw;  The patient agreed to schedule eye exam soon  Will take High does flu shot today;   Very nice guy, motivated to stay healthy!  During the course of the visit the patient was educated and counseled about the following appropriate screening and preventive services:   Vaccines to include Pneumoccal, Influenza, Hepatitis B, Td, Zostavax, HCV/ high does flu shot today  Electrocardiogram   Cardiovascular Disease no issues at present  Colorectal cancer screening 12/2016  Diabetes screening neg last a1c   Prostate Cancer Screening- deferred   Glaucoma screening/ to have eyes checked soon   Nutrition counseling / eats well   Smoking cessation counseling/ n/a   Patient Instructions (the written plan) was given to the patient.    Wynetta Fines, RN  12/22/2015

## 2015-12-22 ENCOUNTER — Ambulatory Visit: Payer: Commercial Managed Care - HMO | Attending: Family Medicine | Admitting: Physical Therapy

## 2015-12-22 ENCOUNTER — Ambulatory Visit (INDEPENDENT_AMBULATORY_CARE_PROVIDER_SITE_OTHER): Payer: Commercial Managed Care - HMO

## 2015-12-22 DIAGNOSIS — Z23 Encounter for immunization: Secondary | ICD-10-CM

## 2015-12-22 DIAGNOSIS — Z0000 Encounter for general adult medical examination without abnormal findings: Secondary | ICD-10-CM

## 2015-12-22 DIAGNOSIS — M25652 Stiffness of left hip, not elsewhere classified: Secondary | ICD-10-CM

## 2015-12-22 DIAGNOSIS — M6281 Muscle weakness (generalized): Secondary | ICD-10-CM | POA: Insufficient documentation

## 2015-12-22 DIAGNOSIS — M5416 Radiculopathy, lumbar region: Secondary | ICD-10-CM | POA: Insufficient documentation

## 2015-12-22 NOTE — Patient Instructions (Signed)
Bracing With Arm Raise (Quadruped)  On hands and knees find neutral spine. Tighten pelvic floor and abdominals and hold. Alternately lift arm to shoulder level. Repeat _10__ times. Do _1__ times a day.  Quadruped Alternate Hip Extension  Shift weight to one side and raise opposite leg. Keep trunk steady. _10__ reps per set, _1__ sets per day, __5_ days per week Repeat with other leg.  Bracing With Arm / Leg Raise (Quadruped)  On hands and knees find neutral spine. Tighten pelvic floor and abdominals and hold. Alternating, lift arm to shoulder level and opposite leg to hip level. Repeat _10__ times. Do _1__ times a day.  Quadruped: Hip Abduction / Knee Flexion (Active)   On hands and knees, lift right bent knee out to the side.  Complete _1__ sets of _10__ repetitions. Perform __1_ sessions per day.  Copyright  VHI. All rights reserved.    Ruben Im PT [facility] [address], [place], [place] [address] Phone # [phone] Fax [phone]

## 2015-12-22 NOTE — Therapy (Addendum)
[facility] [address], [place], [place], [address] Phone: [phone]   Fax:  [phone]  Physical Therapy Treatment/Discharge Summary  Patient Details  Name: Richard Middleton MRN: [id number] Date of Birth: Mar 26, 1946 Referring Provider: Dr. Betty Martinique  Encounter Date: 12/22/2015      PT End of Session - 12/22/15 1014    Visit Number 2   Number of Visits 10   Date for PT Re-Evaluation 02/02/16   Authorization Type Medicare G codes;  KX at visit 15   PT Start Time 0930   PT Stop Time 1013   PT Time Calculation (min) 43 min   Activity Tolerance Patient tolerated treatment well      Past Medical History:  Diagnosis Date  . Abnormal glucose   . History of small bowel obstruction    x2    Past Surgical History:  Procedure Laterality Date  . LAPAROTOMY N/A 07/13/2014   Procedure: EXPLORATORY LAPAROTOMY lysis of adhesions;  Surgeon: Leighton Ruff, MD;  Location: [facility];  Service: General;  Laterality: N/A;  . RESECTION SMALL BOWEL / CLOSURE ILEOSTOMY      There were no vitals filed for this visit.      Subjective Assessment - 12/22/15 0934    Subjective Left LE symptoms still present but now less intense and intermittent.  Decreased use of Advil to only 2-3 x in the last 2 weeks.  Stretching 2x/day.  Has not returned to running or tennis.    Currently in Pain? Yes   Pain Score 2    Pain Orientation Left   Pain Type Acute pain   Pain Onset More than a month ago   Pain Frequency Intermittent                         [facility] Adult PT Treatment/Exercise - 12/22/15 0001      Knee/Hip Exercises: Stretches   Active Hamstring Stretch Left;1 rep;30 seconds   Quad Stretch Left   Hip Flexor Stretch Left;1 rep;30 seconds   Other Knee/Hip Stretches piriformis stretch on ball 30 sec     Knee/Hip Exercises: Standing   SLS with UE green band diagonals 15x2   Other Standing Knee Exercises side  stepping with red band around thighs and crouched 6 laps     Knee/Hip Exercises: Supine   Bridges Strengthening;15 reps      Review of previous HEP.            PT Education - 12/22/15 0955    Education provided Yes   Education Details bird dogs;  Theatre stage manager) Educated Patient   Methods Explanation;Demonstration;Handout   Comprehension Verbalized understanding;Returned demonstration          PT Short Term Goals - 12/22/15 1210      PT SHORT TERM GOAL #1   Title The patient will demonstrate compliance with basic self care strategies to promote healing      Status Achieved     PT SHORT TERM GOAL #2   Title The patient will report a 30% improvement in morning symptoms   Status Achieved           PT Long Term Goals - 12/22/15 1210      PT LONG TERM GOAL #1   Title The patient will be independent in safe self progression of HEP to promote further improvements in ROM and strength  02/02/16   Time 8   Period  Weeks   Status On-going     PT LONG TERM GOAL #2   Title The patient will have increased left hip external rotation to 35 degrees needed for return to yard work and tennis   Time 8   Period Weeks   Status On-going     PT LONG TERM GOAL #3   Title Left hip abduction strength improved to 4+/5 needed for return to tennis   Time 8   Period Weeks   Status On-going     PT LONG TERM GOAL #4   Title Patient will have a 60% improvement in distal left LE symptoms with usual ADLs   Time 8   Period Weeks   Status On-going     PT LONG TERM GOAL #5   Title FOTO functional outcome score improved to 35% indicating improved function with less pain   Time 8   Period Weeks   Status On-going               Plan - 12/22/15 1206    Clinical Impression Statement The patient is improving with pain intensity and frequency.  He is able to participate in intermediate level core and hip strengthening with mild increase in symptoms.  Therapist closely  monitoring symptoms. All STGs met.     PT Next Visit Plan core and gluteal strengthening progressing to facilitate return to tennis      Patient will benefit from skilled therapeutic intervention in order to improve the following deficits and impairments:     Visit Diagnosis: Radiculopathy, lumbar region  Stiffness of left hip, not elsewhere classified  Muscle weakness (generalized)    PHYSICAL THERAPY DISCHARGE SUMMARY  Visits from Start of Care: 2  Current functional level related to goals / functional outcomes: The patient attended 2 PT visits, he called to request discharge from PT stating he was "feeling better."     Remaining deficits: As above.  See clinical impressions   Education / Equipment: HEP Plan: Patient agrees to discharge.  Patient goals were partially met. Patient is being discharged due to being pleased with the current functional level.  ?????        Problem List Patient Active Problem List   Diagnosis Date Noted  . Small bowel obstruction ([place]) 07/11/2014  . Tendinitis of forearm 04/19/2013  . Preventative health care 01/24/2012  . LIPOMA 06/29/2010  . TINEA BARBAE 01/22/2010  . SMALL BOWEL OBSTRUCTION, HX OF 02/19/2007   Richard Middleton, PT 12/22/15 12:12 PM Phone: [phone] Fax: [phone] Richard Middleton 12/22/2015, 12:11 PM  [facility] [address], [place], [place], [address] Phone: [phone]   Fax:  [phone]  Name: Richard Middleton MRN: [id number] Date of Birth: Sep 02, 1945

## 2015-12-22 NOTE — Patient Instructions (Addendum)
[name] , Thank you for taking time to come for your Medicare Wellness Visit. I appreciate your ongoing commitment to your health goals. Please review the following plan we discussed and let me know if I can assist you in the future.   To schedule a vision exam this year    will take the high does flu shot   I will order the Hep C screen   These are the goals we discussed: Goals    . Exercise 150 minutes per week (moderate activity)          Will consider the gym for strength training        This is a list of the screening recommended for you and due dates:  Health Maintenance  Topic Date Due  .  Hepatitis C: One time screening is recommended by Center for Disease Control  (CDC) for  adults born from [birth year] through 1964.   [birth year]  . Flu Shot  11/14/2015  . Tetanus Vaccine  07/31/2016  . Colon Cancer Screening  12/26/2016  . Shingles Vaccine  Completed  . Pneumonia vaccines  Completed       Fall Prevention in the Home  Falls can cause injuries. They can happen to people of all ages. There are many things you can do to make your home safe and to help prevent falls.  WHAT CAN I DO ON THE OUTSIDE OF MY HOME?  Regularly fix the edges of walkways and driveways and fix any cracks.  Remove anything that might make you trip as you walk through a door, such as a raised step or threshold.  Trim any bushes or trees on the path to your home.  Use bright outdoor lighting.  Clear any walking paths of anything that might make someone trip, such as rocks or tools.  Regularly check to see if handrails are loose or broken. Make sure that both sides of any steps have handrails.  Any raised decks and porches should have guardrails on the edges.  Have any leaves, snow, or ice cleared regularly.  Use sand or salt on walking paths during winter.  Clean up any spills in your garage right away. This includes oil or grease spills. WHAT CAN I DO IN THE BATHROOM?   Use night  lights.  Install grab bars by the toilet and in the tub and shower. Do not use towel bars as grab bars.  Use non-skid mats or decals in the tub or shower.  If you need to sit down in the shower, use a plastic, non-slip stool.  Keep the floor dry. Clean up any water that spills on the floor as soon as it happens.  Remove soap buildup in the tub or shower regularly.  Attach bath mats securely with double-sided non-slip rug tape.  Do not have throw rugs and other things on the floor that can make you trip. WHAT CAN I DO IN THE BEDROOM?  Use night lights.  Make sure that you have a light by your bed that is easy to reach.  Do not use any sheets or blankets that are too big for your bed. They should not hang down onto the floor.  Have a firm chair that has side arms. You can use this for support while you get dressed.  Do not have throw rugs and other things on the floor that can make you trip. WHAT CAN I DO IN THE KITCHEN?  Clean up any spills right away.  Avoid walking  on wet floors.  Keep items that you use a lot in easy-to-reach places.  If you need to reach something above you, use a strong step stool that has a grab bar.  Keep electrical cords out of the way.  Do not use floor polish or wax that makes floors slippery. If you must use wax, use non-skid floor wax.  Do not have throw rugs and other things on the floor that can make you trip. WHAT CAN I DO WITH MY STAIRS?  Do not leave any items on the stairs.  Make sure that there are handrails on both sides of the stairs and use them. Fix handrails that are broken or loose. Make sure that handrails are as long as the stairways.  Check any carpeting to make sure that it is firmly attached to the stairs. Fix any carpet that is loose or worn.  Avoid having throw rugs at the top or bottom of the stairs. If you do have throw rugs, attach them to the floor with carpet tape.  Make sure that you have a light switch at the  top of the stairs and the bottom of the stairs. If you do not have them, ask someone to add them for you. WHAT ELSE CAN I DO TO HELP PREVENT FALLS?  Wear shoes that:  Do not have high heels.  Have rubber bottoms.  Are comfortable and fit you well.  Are closed at the toe. Do not wear sandals.  If you use a stepladder:  Make sure that it is fully opened. Do not climb a closed stepladder.  Make sure that both sides of the stepladder are locked into place.  Ask someone to hold it for you, if possible.  Clearly mark and make sure that you can see:  Any grab bars or handrails.  First and last steps.  Where the edge of each step is.  Use tools that help you move around (mobility aids) if they are needed. These include:  Canes.  Walkers.  Scooters.  Crutches.  Turn on the lights when you go into a dark area. Replace any light bulbs as soon as they burn out.  Set up your furniture so you have a clear path. Avoid moving your furniture around.  If any of your floors are uneven, fix them.  If there are any pets around you, be aware of where they are.  Review your medicines with your doctor. Some medicines can make you feel dizzy. This can increase your chance of falling. Ask your doctor what other things that you can do to help prevent falls.   This information is not intended to replace advice given to you by your health care provider. Make sure you discuss any questions you have with your health care provider.   Document Released: 01/26/2009 Document Revised: 08/16/2014 Document Reviewed: 05/06/2014 Elsevier Interactive Patient Education 2016 [place] Maintenance, Male A healthy lifestyle and preventative care can promote health and wellness.  Maintain regular health, dental, and eye exams.  Eat a healthy diet. Foods like vegetables, fruits, whole grains, low-fat dairy products, and lean protein foods contain the nutrients you need and are low in  calories. Decrease your intake of foods high in solid fats, added sugars, and salt. Get information about a proper diet from your health care provider, if necessary.  Regular physical exercise is one of the most important things you can do for your health. Most adults should get at least 150 minutes of moderate-intensity  exercise (any activity that increases your heart rate and causes you to sweat) each week. In addition, most adults need muscle-strengthening exercises on 2 or more days a week.   Maintain a healthy weight. The body mass index (BMI) is a screening tool to identify possible weight problems. It provides an estimate of body fat based on height and weight. Your health care provider can find your BMI and can help you achieve or maintain a healthy weight. For males 20 years and older:  A BMI below 18.5 is considered underweight.  A BMI of 18.5 to 24.9 is normal.  A BMI of 25 to 29.9 is considered overweight.  A BMI of 30 and above is considered obese.  Maintain normal blood lipids and cholesterol by exercising and minimizing your intake of saturated fat. Eat a balanced diet with plenty of fruits and vegetables. Blood tests for lipids and cholesterol should begin at age 14 and be repeated every 5 years. If your lipid or cholesterol levels are high, you are over age 58, or you are at high risk for heart disease, you may need your cholesterol levels checked more frequently.Ongoing high lipid and cholesterol levels should be treated with medicines if diet and exercise are not working.  If you smoke, find out from your health care provider how to quit. If you do not use tobacco, do not start.  Lung cancer screening is recommended for adults aged 70-80 years who are at high risk for developing lung cancer because of a history of smoking. A yearly low-dose CT scan of the lungs is recommended for people who have at least a 30-pack-year history of smoking and are current smokers or have quit  within the past 15 years. A pack year of smoking is smoking an average of 1 pack of cigarettes a day for 1 year (for example, a 30-pack-year history of smoking could mean smoking 1 pack a day for 30 years or 2 packs a day for 15 years). Yearly screening should continue until the smoker has stopped smoking for at least 15 years. Yearly screening should be stopped for people who develop a health problem that would prevent them from having lung cancer treatment.  If you choose to drink alcohol, do not have more than 2 drinks per day. One drink is considered to be 12 oz (360 mL) of beer, 5 oz (150 mL) of wine, or 1.5 oz (45 mL) of liquor.  Avoid the use of street drugs. Do not share needles with anyone. Ask for help if you need support or instructions about stopping the use of drugs.  High blood pressure causes heart disease and increases the risk of stroke. High blood pressure is more likely to develop in:  People who have blood pressure in the end of the normal range (100-139/85-89 mm Hg).  People who are overweight or obese.  People who are African American.  If you are 71-67 years of age, have your blood pressure checked every 3-5 years. If you are 51 years of age or older, have your blood pressure checked every year. You should have your blood pressure measured twice--once when you are at a hospital or clinic, and once when you are not at a hospital or clinic. Record the average of the two measurements. To check your blood pressure when you are not at a hospital or clinic, you can use:  An automated blood pressure machine at a pharmacy.  A home blood pressure monitor.  If you are 45-79 years  old, ask your health care provider if you should take aspirin to prevent heart disease.  Diabetes screening involves taking a blood sample to check your fasting blood sugar level. This should be done once every 3 years after age 34 if you are at a normal weight and without risk factors for diabetes.  Testing should be considered at a younger age or be carried out more frequently if you are overweight and have at least 1 risk factor for diabetes.  Colorectal cancer can be detected and often prevented. Most routine colorectal cancer screening begins at the age of 43 and continues through age 60. However, your health care provider may recommend screening at an earlier age if you have risk factors for colon cancer. On a yearly basis, your health care provider may provide home test kits to check for hidden blood in the stool. A small camera at the end of a tube may be used to directly examine the colon (sigmoidoscopy or colonoscopy) to detect the earliest forms of colorectal cancer. Talk to your health care provider about this at age 49 when routine screening begins. A direct exam of the colon should be repeated every 5-10 years through age 31, unless early forms of precancerous polyps or small growths are found.  People who are at an increased risk for hepatitis B should be screened for this virus. You are considered at high risk for hepatitis B if:  You were born in a country where hepatitis B occurs often. Talk with your health care provider about which countries are considered high risk.  Your parents were born in a high-risk country and you have not received a shot to protect against hepatitis B (hepatitis B vaccine).  You have HIV or AIDS.  You use needles to inject street drugs.  You live with, or have sex with, someone who has hepatitis B.  You are a man who has sex with other men (MSM).  You get hemodialysis treatment.  You take certain medicines for conditions like cancer, organ transplantation, and autoimmune conditions.  Hepatitis C blood testing is recommended for all people born from [birth year] through 1964 and any individual with known risk factors for hepatitis C.  Healthy men should no longer receive prostate-specific antigen (PSA) blood tests as part of routine cancer screening.  Talk to your health care provider about prostate cancer screening.  Testicular cancer screening is not recommended for adolescents or adult males who have no symptoms. Screening includes self-exam, a health care provider exam, and other screening tests. Consult with your health care provider about any symptoms you have or any concerns you have about testicular cancer.  Practice safe sex. Use condoms and avoid high-risk sexual practices to reduce the spread of sexually transmitted infections (STIs).  You should be screened for STIs, including gonorrhea and chlamydia if:  You are sexually active and are younger than 24 years.  You are older than 24 years, and your health care provider tells you that you are at risk for this type of infection.  Your sexual activity has changed since you were last screened, and you are at an increased risk for chlamydia or gonorrhea. Ask your health care provider if you are at risk.  If you are at risk of being infected with HIV, it is recommended that you take a prescription medicine daily to prevent HIV infection. This is called pre-exposure prophylaxis (PrEP). You are considered at risk if:  You are a man who has sex with other men (MSM).  You are a heterosexual man who is sexually active with multiple partners.  You take drugs by injection.  You are sexually active with a partner who has HIV.  Talk with your health care provider about whether you are at high risk of being infected with HIV. If you choose to begin PrEP, you should first be tested for HIV. You should then be tested every 3 months for as long as you are taking PrEP.  Use sunscreen. Apply sunscreen liberally and repeatedly throughout the day. You should seek shade when your shadow is shorter than you. Protect yourself by wearing long sleeves, pants, a wide-brimmed hat, and sunglasses year round whenever you are outdoors.  Tell your health care provider of new moles or changes in moles,  especially if there is a change in shape or color. Also, tell your health care provider if a mole is larger than the size of a pencil eraser.  A one-time screening for abdominal aortic aneurysm (AAA) and surgical repair of large AAAs by ultrasound is recommended for men aged 34-75 years who are current or former smokers.  Stay current with your vaccines (immunizations).   This information is not intended to replace advice given to you by your health care provider. Make sure you discuss any questions you have with your health care provider.   Document Released: 09/28/2007 Document Revised: 04/22/2014 Document Reviewed: 08/27/2010 Elsevier Interactive Patient Education [employer].

## 2015-12-24 NOTE — Progress Notes (Signed)
I have reviewed documentation from this visit and I agree with recommendations given.  Betty G. Jordan, MD   [facility]. [place] [facility].   

## 2016-01-05 ENCOUNTER — Encounter: Payer: Commercial Managed Care - HMO | Admitting: Physical Therapy

## 2016-01-19 ENCOUNTER — Encounter: Payer: Commercial Managed Care - HMO | Admitting: Physical Therapy

## 2016-02-02 ENCOUNTER — Encounter: Payer: Commercial Managed Care - HMO | Admitting: Physical Therapy

## 2016-07-30 ENCOUNTER — Encounter: Payer: Self-pay | Admitting: Gastroenterology

## 2016-08-13 ENCOUNTER — Encounter: Payer: Self-pay | Admitting: Gastroenterology

## 2016-08-20 NOTE — Progress Notes (Signed)
HPI:  Richard Middleton is a 71 y.o.male here today for his routine physical examination.  I last saw Richard Middleton on 11/22/15 and at that time he was c/o low back pain, resolved.   He lives with wife and their son (19 years old).  Regular exercise 3 or more times per week: Yes, "very active." Following a healthy diet: Yes.   Chronic medical problems: He is otherwise healthy. No Hx of HTN,HLD,or DM.Hx of small bowel obstruction x 2, laparoscopic surgery for lysis of adhesions 06/2014. Hx of intermittent lower back pain.  Hx of STD's: Denies  Immunization History  Administered Date(s) Administered  . Influenza Split 01/24/2012  . Influenza Whole 01/12/2007, 01/05/2008, 02/01/2009, 01/09/2010  . Influenza, High Dose Seasonal PF 12/22/2015  . Influenza,inj,Quad PF,36+ Mos 01/11/2013  . Pneumococcal Conjugate-13 08/18/2015  . Pneumococcal Polysaccharide-23 01/24/2012  . Td 08/01/2006  . Zoster 07/21/2007    Zoster vaccines "long time ago."    -Hep C screening: Has not screened, per pt report Hx of blood transfusions x 2 , after surgeries for SBO.   Last colon cancer screening: 12/27/2006. He has appt for 10/2016. Last prostate ca screening: 07/2015.  Lab Results  Component Value Date   PSA 0.26 08/09/2015   PSA 0.40 06/27/2014   PSA 0.49 02/01/2013   Nocturia 1-2 times, no urine incontinence ,urgency,or leakage.   -Denies high alcohol intake or Hx of illicit drug use. Former smoker. Abdominal CT 06/2014: The aorta and IVC are normal except for some early atherosclerotic change of the aorta without aneurysm.   Eye exam 2 years ago.  -Concerns and/or follow up today:   -"Mole" on back that is growing, denies pruritus,bleeding,or pain. It has been there for many years.  -At night when in bed he has a cough spells for 1/2 min then sneeze and it is gone, all year long for about a year, 1-2 times per wek. He has not identified exacerbating or alleviating factors. Occasional  heartburn. Hx of allergies.  -"Sexual desire": For the past 2 years he has noted decreased interest in sex, he is wondering if there is something to increase libido. His wife is 20 years younger. He is not sure about exacerbating factors, he has not tried OTC products.  Sometimes wine intake helps.  No issues with erections "if" he is in the right "mood."  He denies depressed mood. + Fatigue, attributed to physical activity. He has not noted morning erections in a while.    Review of Systems  Constitutional: Positive for fatigue. Negative for activity change, appetite change, fever and unexpected weight change.  HENT: Negative for dental problem, hearing loss, mouth sores, nosebleeds, sore throat, trouble swallowing and voice change.   Eyes: Negative for redness and visual disturbance.  Respiratory: Negative for cough, shortness of breath and wheezing.   Cardiovascular: Negative for chest pain, palpitations and leg swelling.  Gastrointestinal: Negative for abdominal pain, blood in stool, nausea and vomiting.       No changes in bowel habits.  Endocrine: Negative for cold intolerance, heat intolerance, polydipsia, polyphagia and polyuria.  Genitourinary: Negative for decreased urine volume, dysuria, genital sores, hematuria and testicular pain.  Musculoskeletal: Negative for gait problem and myalgias.  Skin: Negative for pallor and rash.  Allergic/Immunologic: Negative for environmental allergies.  Neurological: Negative for syncope, weakness and headaches.  Hematological: Negative for adenopathy. Does not bruise/bleed easily.  Psychiatric/Behavioral: Negative for confusion and sleep disturbance. The patient is not nervous/anxious.  All other systems reviewed and are negative.    Current Outpatient Prescriptions on File Prior to Visit  Medication Sig Dispense Refill  . Cholecalciferol (VITAMIN D PO) Take 1 capsule by mouth daily.     No current facility-administered medications  on file prior to visit.     Past Medical History:  Diagnosis Date  . Abnormal glucose   . History of small bowel obstruction    x2   Past Surgical History:  Procedure Laterality Date  . LAPAROTOMY N/A 07/13/2014   Procedure: EXPLORATORY LAPAROTOMY lysis of adhesions;  Surgeon: Leighton Ruff, MD;  Location: [facility];  Service: General;  Laterality: N/A;  . RESECTION SMALL BOWEL / CLOSURE ILEOSTOMY      No Known Allergies  Family History  Problem Relation Age of Onset  . Arthritis Mother   . Cancer Neg Hx   . Diabetes Neg Hx     Social History   Social History  . Marital status: Married    Spouse name: N/A  . Number of children: N/A  . Years of education: N/A   Social History Main Topics  . Smoking status: Former Smoker    Packs/day: 4.00    Types: Cigarettes    Start date: 04/15/1966    Quit date: 04/15/1970  . Smokeless tobacco: Never Used  . Alcohol use No  . Drug use: No  . Sexual activity: Not Asked   Other Topics Concern  . None   Social History Narrative  . None     Vitals:   08/21/16 0805  BP: 140/84  Pulse: 85  Resp: 12   Body mass index is 22.43 kg/m.  O2 sat at RA 97% at RA.  Wt Readings from Last 3 Encounters:  08/21/16 170 lb (77.1 kg)  12/22/15 173 lb (78.5 kg)  11/22/15 173 lb 2 oz (78.5 kg)    Physical Exam  Nursing note and vitals reviewed. Constitutional: He is oriented to person, place, and time. He appears well-developed and well-nourished. No distress.  HENT:  Head: Atraumatic.  Right Ear: Hearing, tympanic membrane, external ear and ear canal normal.  Left Ear: Hearing, tympanic membrane, external ear and ear canal normal.  Mouth/Throat: Uvula is midline, oropharynx is clear and moist and mucous membranes are normal.  Eyes: Conjunctivae and EOM are normal. Pupils are equal, round, and reactive to light.  Neck: Normal range of motion. No tracheal deviation present. No thyroid mass and no thyromegaly present.  Cardiovascular:  Normal rate.  An irregular rhythm present.  No murmur heard. Pulses:      Dorsalis pedis pulses are 2+ on the right side, and 2+ on the left side.  Respiratory: Effort normal and breath sounds normal. No respiratory distress.  GI: Soft. He exhibits no mass. There is no tenderness. Hernia confirmed negative in the right inguinal area and confirmed negative in the left inguinal area.  Genitourinary: Penis normal. Prostate is enlarged (moderate, no nodules). Prostate is not tender. Right testis shows no mass and no tenderness. Left testis shows no mass and no tenderness.  Musculoskeletal: He exhibits no edema or tenderness.  No major deformities appreciated and no signs of synovitis.  Lymphadenopathy:    He has no cervical adenopathy.       Right: No supraclavicular adenopathy present.       Left: No supraclavicular adenopathy present.  Neurological: He is alert and oriented to person, place, and time. He has normal strength. No cranial nerve deficit or sensory deficit. Coordination and  gait normal.  Reflex Scores:      Bicep reflexes are 2+ on the right side and 2+ on the left side.      Patellar reflexes are 2+ on the right side and 2+ on the left side. Skin: Skin is warm. No rash noted. No erythema.     Lesions of concerned: Hyperpigmented,raised,oval lesion, 1 cm x 1.5 cm, irregular surface, define borders. Similar lesions, smaller, scattered on upper back.  Psychiatric: He has a normal mood and affect. Cognition and memory are normal.     ASSESSMENT AND PLAN:   Discussed the following assessment and plan:   [place] was seen today for annual exam.  Diagnoses and all orders for this visit:  Encounter for general adult medical examination with abnormal findings   We discussed the importance of regular physical activity and healthy diet for prevention of chronic illness and/or complications. Preventive guidelines reviewed. Abdominal CT 2016 neg for aortic aneurysm. Fall  prevention. Vaccination up to date,interested in new zoster vaccine, Rx given.  Next CPE in 1 year.   -     Hepatitis C antibody screen -     Zoster Vac Recomb Adjuvanted (SHINGRIX) injection; Inject 0.5 mLs into the muscle every 3 (three) months.  Irregular heart rate  Asymptomatic.  EKG today: SR, normal axis and intervals, J point elevation V3-4-5 (most likely early repolarization), no acute ischemic changes. EKG 2012 with "rate variation",I could not see this EKG. 05/2009 no significant changes. Clearly instructed about warning signs. We will continue following with EKG every 1-2 years.   -     EKG 12-Lead -     Comprehensive metabolic panel -     TSH  Prostate cancer screening -     PSA(Must document that pt has been informed of limitations of PSA testing.)  Encounter for hepatitis C screening test for low risk patient -     Hepatitis C antibody screen  Decreased libido  Plan discussed possible causes. Further recommendations will be given according to lab results. He testosterone, he will need to come back to repeat lab. We will arrange follow-up depending on lab results.  -     Testosterone  Seborrheic keratoses  Reassured. Still continue monitoring lesion.    52 min OV face to face (66 min appt).    Return in about 1 year (around 08/21/2017).    Luan Urbani G. [place], MD  [facility]. [place] office.

## 2016-08-21 ENCOUNTER — Encounter: Payer: Self-pay | Admitting: Family Medicine

## 2016-08-21 ENCOUNTER — Ambulatory Visit (INDEPENDENT_AMBULATORY_CARE_PROVIDER_SITE_OTHER): Payer: Commercial Managed Care - HMO | Admitting: Family Medicine

## 2016-08-21 VITALS — BP 140/84 | HR 85 | Resp 12 | Ht 73.0 in | Wt 170.0 lb

## 2016-08-21 DIAGNOSIS — Z125 Encounter for screening for malignant neoplasm of prostate: Secondary | ICD-10-CM | POA: Diagnosis not present

## 2016-08-21 DIAGNOSIS — L821 Other seborrheic keratosis: Secondary | ICD-10-CM | POA: Diagnosis not present

## 2016-08-21 DIAGNOSIS — R6882 Decreased libido: Secondary | ICD-10-CM

## 2016-08-21 DIAGNOSIS — Z1159 Encounter for screening for other viral diseases: Secondary | ICD-10-CM | POA: Diagnosis not present

## 2016-08-21 DIAGNOSIS — I499 Cardiac arrhythmia, unspecified: Secondary | ICD-10-CM

## 2016-08-21 DIAGNOSIS — Z0001 Encounter for general adult medical examination with abnormal findings: Secondary | ICD-10-CM | POA: Diagnosis not present

## 2016-08-21 LAB — COMPREHENSIVE METABOLIC PANEL
ALBUMIN: 4.1 g/dL (ref 3.5–5.2)
ALT: 14 U/L (ref 0–53)
AST: 17 U/L (ref 0–37)
Alkaline Phosphatase: 91 U/L (ref 39–117)
BUN: 10 mg/dL (ref 6–23)
CALCIUM: 9.5 mg/dL (ref 8.4–10.5)
CO2: 31 mEq/L (ref 19–32)
CREATININE: 1.07 mg/dL (ref 0.40–1.50)
Chloride: 102 mEq/L (ref 96–112)
GFR: 87.87 mL/min (ref >60.00)
Glucose, Bld: 91 mg/dL (ref 70–99)
POTASSIUM: 4 meq/L (ref 3.5–5.1)
Sodium: 138 mEq/L (ref 135–145)
Total Bilirubin: 0.8 mg/dL (ref 0.2–1.2)
Total Protein: 6.7 g/dL (ref 6.0–8.3)

## 2016-08-21 LAB — TESTOSTERONE: Testosterone: 408.41 ng/dL (ref 300.00–890.00)

## 2016-08-21 LAB — PSA: PSA: 0.31 ng/mL (ref 0.10–4.00)

## 2016-08-21 MED ORDER — ZOSTER VAC RECOMB ADJUVANTED 50 MCG/0.5ML IM SUSR: 0.5000 mL | INTRAMUSCULAR | 1 refills | Status: AC

## 2016-08-21 NOTE — Patient Instructions (Signed)
A few things to remember from today's visit:   Irregular heart rate - Plan: EKG 12-Lead, Comprehensive metabolic panel, TSH  Prostate cancer screening - Plan: PSA(Must document that pt has been informed of limitations of PSA testing.)  Encounter for hepatitis C screening test for low risk patient - Plan: Hepatitis C antibody screen  Encounter for general adult medical examination with abnormal findings - Plan: Hepatitis C antibody screen  Decreased libido - Plan: Testosterone  A few tips:  -As we age balance is not as good as it was, so there is a higher risks for falls. Please remove small rugs and furniture that is "in your way" and could increase the risk of falls. Stretching exercises may help with fall prevention: Yoga and Tai Chi are some examples. Low impact exercise is better, so you are not very achy the next day.  -Sun screen and avoidance of direct sun light recommended. Caution with dehydration, if working outdoors be sure to drink enough fluids.  - Some medications are not safe as we age, increases the risk of side effects and can potentially interact with other medication you are also taken;  including some of over the counter medications. Be sure to let me know when you start a new medication even if it is a dietary/vitamin supplement.   -Healthy diet low in red meet/animal fat and sugar + regular physical activity is recommended.       Please be sure medication list is accurate. If a new problem present, please set up appointment sooner than planned today.

## 2016-08-22 LAB — HEPATITIS C ANTIBODY: HCV Ab: NEGATIVE

## 2016-08-22 LAB — TSH: TSH: 1.46 u[IU]/mL (ref 0.35–4.50)

## 2016-09-30 ENCOUNTER — Ambulatory Visit (AMBULATORY_SURGERY_CENTER): Payer: Self-pay | Admitting: *Deleted

## 2016-09-30 ENCOUNTER — Telehealth: Payer: Self-pay | Admitting: Gastroenterology

## 2016-09-30 VITALS — Ht 73.0 in | Wt 169.0 lb

## 2016-09-30 DIAGNOSIS — Z1211 Encounter for screening for malignant neoplasm of colon: Secondary | ICD-10-CM

## 2016-09-30 MED ORDER — NA SULFATE-K SULFATE-MG SULF 17.5-3.13-1.6 GM/177ML PO SOLN: ORAL | 0 refills | Status: DC

## 2016-09-30 NOTE — Progress Notes (Signed)
Patient denies any allergies to eggs or soy. Patient denies any problems with anesthesia/sedation. Patient denies any oxygen use at home and does not take any diet/weight loss medications. EMMI education assisgned to patient on colonoscopy, this was explained and instructions given to patient. Patient did not want it sent to email.

## 2016-09-30 NOTE — Telephone Encounter (Signed)
Spoke with patient. Suprep cost $95. Explained that his dr does not use the [employer]. That suprep is the better prep with less volume. Patient is okay with going ahead and using the [facility].

## 2016-10-21 ENCOUNTER — Encounter: Payer: Commercial Managed Care - HMO | Admitting: Gastroenterology

## 2016-10-21 ENCOUNTER — Encounter: Payer: Self-pay | Admitting: Gastroenterology

## 2016-10-31 ENCOUNTER — Ambulatory Visit (AMBULATORY_SURGERY_CENTER): Payer: Medicare HMO | Admitting: Gastroenterology

## 2016-10-31 ENCOUNTER — Encounter: Payer: Self-pay | Admitting: Gastroenterology

## 2016-10-31 VITALS — BP 103/52 | HR 61 | Temp 99.3°F | Resp 16 | Ht 73.0 in | Wt 169.0 lb

## 2016-10-31 DIAGNOSIS — Z1212 Encounter for screening for malignant neoplasm of rectum: Secondary | ICD-10-CM

## 2016-10-31 DIAGNOSIS — D128 Benign neoplasm of rectum: Secondary | ICD-10-CM

## 2016-10-31 DIAGNOSIS — Z86010 Personal history of colonic polyps: Secondary | ICD-10-CM | POA: Diagnosis not present

## 2016-10-31 DIAGNOSIS — Z1211 Encounter for screening for malignant neoplasm of colon: Secondary | ICD-10-CM

## 2016-10-31 DIAGNOSIS — D129 Benign neoplasm of anus and anal canal: Secondary | ICD-10-CM

## 2016-10-31 MED ORDER — SODIUM CHLORIDE 0.9 % IV SOLN: 500.0000 mL | INTRAVENOUS | Status: AC

## 2016-10-31 NOTE — Patient Instructions (Signed)
Impression/Recommendations:  Polyp handout given to patient. Diverticulosis handout given to patient.  Resume previous diet. Continue present medications.  Repeat colonoscopy recommended for surveillance.  Date to be determined after pathology results reviewed.  YOU HAD AN ENDOSCOPIC PROCEDURE TODAY AT THE [facility]:   Refer to the procedure report that was given to you for any specific questions about what was found during the examination.  If the procedure report does not answer your questions, please call your gastroenterologist to clarify.  If you requested that your care partner not be given the details of your procedure findings, then the procedure report has been included in a sealed envelope for you to review at your convenience later.  YOU SHOULD EXPECT: Some feelings of bloating in the abdomen. Passage of more gas than usual.  Walking can help get rid of the air that was put into your GI tract during the procedure and reduce the bloating. If you had a lower endoscopy (such as a colonoscopy or flexible sigmoidoscopy) you may notice spotting of blood in your stool or on the toilet paper. If you underwent a bowel prep for your procedure, you may not have a normal bowel movement for a few days.  Please Note:  You might notice some irritation and congestion in your nose or some drainage.  This is from the oxygen used during your procedure.  There is no need for concern and it should clear up in a day or so.  SYMPTOMS TO REPORT IMMEDIATELY:   Following lower endoscopy (colonoscopy or flexible sigmoidoscopy):  Excessive amounts of blood in the stool  Significant tenderness or worsening of abdominal pains  Swelling of the abdomen that is new, acute  Fever of 100F or higher  For urgent or emergent issues, a gastroenterologist can be reached at any hour by calling [phone].   DIET:  We do recommend a small meal at first, but then you may proceed to your regular diet.   Drink plenty of fluids but you should avoid alcoholic beverages for 24 hours.  ACTIVITY:  You should plan to take it easy for the rest of today and you should NOT DRIVE or use heavy machinery until tomorrow (because of the sedation medicines used during the test).    FOLLOW UP: Our staff will call the number listed on your records the next business day following your procedure to check on you and address any questions or concerns that you may have regarding the information given to you following your procedure. If we do not reach you, we will leave a message.  However, if you are feeling well and you are not experiencing any problems, there is no need to return our call.  We will assume that you have returned to your regular daily activities without incident.  If any biopsies were taken you will be contacted by phone or by letter within the next 1-3 weeks.  Please call us at [phone] if you have not heard about the biopsies in 3 weeks.    SIGNATURES/CONFIDENTIALITY: You and/or your care partner have signed paperwork which will be entered into your electronic medical record.  These signatures attest to the fact that that the information above on your After Visit Summary has been reviewed and is understood.  Full responsibility of the confidentiality of this discharge information lies with you and/or your care-partner. 

## 2016-10-31 NOTE — Progress Notes (Signed)
Report given to PACU, vss 

## 2016-10-31 NOTE — Op Note (Signed)
[place] Patient Name: Richard Middleton Procedure Date: 10/31/2016 3:06 PM MRN: [id number] Endoscopist: Mallie Mussel L. Loletha Carrow , MD Age: 71 Referring MD:  Date of Birth: [birth year] Gender: Male Account #: [id number] Procedure:                Colonoscopy Indications:              Screening for colorectal malignant neoplasm ( no                            polyps on 09/2006 colonoscopy) Medicines:                Monitored Anesthesia Care Procedure:                Pre-Anesthesia Assessment:                           - Prior to the procedure, a History and Physical                            was performed, and patient medications and                            allergies were reviewed. The patient's tolerance of                            previous anesthesia was also reviewed. The risks                            and benefits of the procedure and the sedation                            options and risks were discussed with the patient.                            All questions were answered, and informed consent                            was obtained. Prior Anticoagulants: The patient has                            taken no previous anticoagulant or antiplatelet                            agents. ASA Grade Assessment: II - A patient with                            mild systemic disease. After reviewing the risks                            and benefits, the patient was deemed in                            satisfactory condition to undergo the procedure.  After obtaining informed consent, the colonoscope                            was passed under direct vision. Throughout the                            procedure, the patient's blood pressure, pulse, and                            oxygen saturations were monitored continuously. The                            Colonoscope was introduced through the anus and                            advanced to the the cecum, identified  by                            appendiceal orifice and ileocecal valve. The                            colonoscopy was performed without difficulty. The                            patient tolerated the procedure well. The quality                            of the bowel preparation was excellent. The                            ileocecal valve, appendiceal orifice, and rectum                            were photographed. The quality of the bowel                            preparation was evaluated using the BBPS [facility]                            Bowel Preparation Scale) with scores of: Right                            Colon = 3, Transverse Colon = 3 and Left Colon = 3                            (entire mucosa seen well with no residual staining,                            small fragments of stool or opaque liquid). The                            total BBPS score equals 9. The bowel preparation  used was SUPREP. Scope In: 3:12:19 PM Scope Out: 3:28:37 PM Scope Withdrawal Time: 0 hours 12 minutes 56 seconds  Total Procedure Duration: 0 hours 16 minutes 18 seconds  Findings:                 The perianal and digital rectal examinations were                            normal.                           A 2 mm polyp was found in the rectum. The polyp was                            sessile. The polyp was removed with a piecemeal                            technique using a cold biopsy forceps. Resection                            and retrieval were complete.                           A few small-mouthed diverticula were found in the                            sigmoid colon.                           The exam was otherwise without abnormality on                            direct and retroflexion views. Complications:            No immediate complications. Estimated Blood Loss:     Estimated blood loss: none. Estimated blood loss                            was  minimal. Impression:               - One 2 mm polyp in the rectum, removed piecemeal                            using a cold biopsy forceps. Resected and retrieved.                           - Diverticulosis in the sigmoid colon.                           - The examination was otherwise normal on direct                            and retroflexion views. Recommendation:           - Patient has a contact number available for  emergencies. The signs and symptoms of potential                            delayed complications were discussed with the                            patient. Return to normal activities tomorrow.                            Written discharge instructions were provided to the                            patient.                           - Resume previous diet.                           - Continue present medications.                           - Await pathology results.                           - Repeat colonoscopy is recommended for                            surveillance. The colonoscopy date will be                            determined after pathology results from today's                            exam become available for review. Henry L. Loletha Carrow, MD 10/31/2016 3:32:44 PM This report has been signed electronically.

## 2016-10-31 NOTE — Progress Notes (Signed)
Called to room to assist during endoscopic procedure.  Patient ID and intended procedure confirmed with present staff. Received instructions for my participation in the procedure from the performing physician.  

## 2016-11-01 ENCOUNTER — Telehealth: Payer: Self-pay

## 2016-11-01 NOTE — Telephone Encounter (Signed)
  Follow up Call-  Call back number 10/31/2016  Post procedure Call Back phone  # [phone]  Permission to leave phone message Yes  Some recent data might be hidden     Patient questions:  Do you have a fever, pain , or abdominal swelling? No. Pain Score  0 *  Have you tolerated food without any problems? Yes.    Have you been able to return to your normal activities? Yes.    Do you have any questions about your discharge instructions: Diet   No. Medications  No. Follow up visit  No.  Do you have questions or concerns about your Care? No.  Actions: * If pain score is 4 or above: No action needed, pain <4.  No problems noted per pt. maw

## 2016-11-05 ENCOUNTER — Encounter: Payer: Self-pay | Admitting: Gastroenterology

## 2016-11-11 ENCOUNTER — Telehealth: Payer: Self-pay | Admitting: Gastroenterology

## 2016-11-11 NOTE — Telephone Encounter (Signed)
Spoke to patient reassured him that it sounds like he is doing everything possible for a healthy lifestyle, eating a variety of fruits and vegetables, red meat once a month, exercising. He was surprised by the pathology report and letter he received. I let him know that if he has changes in his bowel habits to call our office.

## 2017-04-30 ENCOUNTER — Encounter: Payer: Self-pay | Admitting: Family Medicine

## 2017-04-30 ENCOUNTER — Ambulatory Visit: Payer: Self-pay | Admitting: *Deleted

## 2017-04-30 ENCOUNTER — Ambulatory Visit (INDEPENDENT_AMBULATORY_CARE_PROVIDER_SITE_OTHER): Payer: Medicare HMO | Admitting: Family Medicine

## 2017-04-30 VITALS — BP 120/70 | HR 94 | Temp 98.1°F | Wt 178.9 lb

## 2017-04-30 DIAGNOSIS — M7660 Achilles tendinitis, unspecified leg: Secondary | ICD-10-CM

## 2017-04-30 NOTE — Progress Notes (Signed)
Subjective:    Patient ID: Richard Middleton, male    DOB: 17-Jul-1945, 72 y.o.   MRN: [id number]  Chief Complaint  Patient presents with  . Foot Pain  Patient is accompanied by his son.  HPI Patient was seen today for acute concern.  Patient states he was playing tennis around 12:30 PM when he went to back pedal and pick up a ball he felt a slight pop in his right ankle/foot.  Patient immediately wrapped his ankle and applied ice.  Patient was able to bear some weight after the injury.  At the time of injury pain was 7/10.  Patient has since taken Tylenol which has helped some.  Patient is able to walk into the clinic without assistance.  Past Medical History:  Diagnosis Date  . Abnormal glucose   . History of small bowel obstruction    x2    No Known Allergies  ROS General: Denies fever, chills, night sweats, changes in weight, changes in appetite HEENT: Denies headaches, ear pain, changes in vision, rhinorrhea, sore throat CV: Denies CP, palpitations, SOB, orthopnea Pulm: Denies SOB, cough, wheezing GI: Denies abdominal pain, nausea, vomiting, diarrhea, constipation GU: Denies dysuria, hematuria, frequency, vaginal discharge Msk: Denies muscle cramps, joint pains  +R ankle pain Neuro: Denies weakness, numbness, tingling Skin: Denies rashes, bruising Psych: Denies depression, anxiety, hallucinations     Objective:    Blood pressure 120/70, pulse 94, temperature 98.1 F (36.7 C), temperature source Oral, weight 178 lb 14.4 oz (81.1 kg).   Gen. Pleasant, well-nourished, in no distress, normal affect HEENT: [place]/AT, face symmetric, no scleral icterus, PERRLA, nares patent without drainage  Lungs: no accessory muscle use, CTAB, no wheezes or rales Cardiovascular: RRR, no m/r/g, no peripheral edema Musculoskeletal: Right ankle without edema, erythema or induration.  1 cm nodule present on right Achilles tendon ~ 9 cm superior to R calcaneous.  TTP of achilles tendon.  Right foot with  5/5 strength with plantar flexion, no pain with inversion or eversion of R foot.  No deformities or deficits of L foot and ankle. no cyanosis or clubbing, normal tone.  DP and PT pulses 2+ b/l Neuro:  A&Ox3, CN II-XII intact, normal gait Skin:  Warm, no lesions/ rash   Wt Readings from Last 3 Encounters:  04/30/17 178 lb 14.4 oz (81.1 kg)  10/31/16 169 lb (76.7 kg)  09/30/16 169 lb (76.7 kg)    Lab Results  Component Value Date   WBC 4.6 08/09/2015   HGB 13.8 08/09/2015   HCT 41.1 08/09/2015   PLT 196.0 08/09/2015   GLUCOSE 91 08/21/2016   CHOL 138 08/09/2015   TRIG 45.0 08/09/2015   HDL 52.00 08/09/2015   LDLCALC 77 08/09/2015   ALT 14 08/21/2016   AST 17 08/21/2016   NA 138 08/21/2016   K 4.0 08/21/2016   CL 102 08/21/2016   CREATININE 1.07 08/21/2016   BUN 10 08/21/2016   CO2 31 08/21/2016   TSH 1.46 08/21/2016   PSA 0.31 08/21/2016   HGBA1C 5.7 04/22/2007    Assessment/Plan:  Achilles tendon pain, R -given slight deformity noted on physical exam, concern for partial rupture will send to Sports Med for further evaluation and treatment. -Patient encouraged to continue icing ankle, using NSAIDs, elevating foot, and wrapping foot/ankle -foot/ankle wrapped in clinic. - Plan: Ambulatory referral to Sports Medicine   F/u prn  Grier Mitts, MD

## 2017-04-30 NOTE — Telephone Encounter (Signed)
Pt on the schedule for today here at this office.

## 2017-04-30 NOTE — Telephone Encounter (Signed)
Pt  Reports  Injured  His  r  Foot/  Ankle    Ans  Well  As  Heel  Poss  Tendon   Injury  As  He  Felt something  Stretch  When  He  Was  Playing  Tennis  And  Was going  Backwards   He  Has  Put  Ice  On the  Affected   Area  And  Has  It elevated . He   Reports  Pain  Scale  Of  7  Which is  Moderate . Appointment  Made  Today  Pt  Advised  Not to  Drive  Reason for Disposition . [1] MODERATE pain (e.g., interferes with normal activities, limping) AND [2] present > 3 days . [1] Limp when walking AND [2] due to a twisted ankle or foot  Answer Assessment - Initial Assessment Questions 1. ONSET: "When did the pain start?"      I  .15 hours  Ago   2. LOCATION: "Where is the pain located?"      R  Ankle    Achillis    Heel  Area   3. PAIN: "How bad is the pain?"    (Scale 1-10; or mild, moderate, severe)  - MILD (1-3): doesn't interfere with normal activities   - MODERATE (4-7): interferes with normal activities (e.g., work or school) or awakens from sleep, limping   - SEVERE (8-10): excruciating pain, unable to do any normal activities, unable to walk      7  Moderate   4. WORK OR EXERCISE: "Has there been any recent work or exercise that involved this part of the body?"      Inj  Playing    5. CAUSE: "What do you think is causing the ankle pain?"       Injured    6. OTHER SYMPTOMS: "Do you have any other symptoms?" (e.g., calf pain, rash, fever, swelling)       None    7. PREGNANCY: "Is there any chance you are pregnant?" "When was your last menstrual period?"     none  Protocols used: ANKLE PAIN-A-AH, FOOT AND ANKLE INJURY-A-AH

## 2017-05-01 ENCOUNTER — Ambulatory Visit (INDEPENDENT_AMBULATORY_CARE_PROVIDER_SITE_OTHER): Payer: Self-pay

## 2017-05-01 ENCOUNTER — Encounter (HOSPITAL_COMMUNITY): Payer: Self-pay | Admitting: *Deleted

## 2017-05-01 ENCOUNTER — Other Ambulatory Visit: Payer: Self-pay

## 2017-05-01 ENCOUNTER — Ambulatory Visit (INDEPENDENT_AMBULATORY_CARE_PROVIDER_SITE_OTHER): Payer: Medicare HMO | Admitting: Surgery

## 2017-05-01 ENCOUNTER — Other Ambulatory Visit (INDEPENDENT_AMBULATORY_CARE_PROVIDER_SITE_OTHER): Payer: Self-pay | Admitting: Family

## 2017-05-01 ENCOUNTER — Encounter (INDEPENDENT_AMBULATORY_CARE_PROVIDER_SITE_OTHER): Payer: Self-pay | Admitting: Surgery

## 2017-05-01 VITALS — BP 133/81 | HR 72 | Ht 73.0 in | Wt 178.0 lb

## 2017-05-01 DIAGNOSIS — S86011A Strain of right Achilles tendon, initial encounter: Secondary | ICD-10-CM | POA: Diagnosis not present

## 2017-05-01 DIAGNOSIS — M6701 Short Achilles tendon (acquired), right ankle: Secondary | ICD-10-CM | POA: Diagnosis not present

## 2017-05-01 NOTE — Progress Notes (Signed)
Office Visit Note   Patient: Richard Middleton           Date of Birth: 08-27-1945           MRN: [id number] Visit Date: 05/01/2017              Requested by: Billie Ruddy, MD [place], [place] [address] PCP: Martinique, Betty G, MD   Assessment & Plan: Visit Diagnoses:  1. Short Achilles tendon (acquired), right ankle   2. Achilles tendon tear, right, initial encounter     Plan: Patient seen and examined with Dr. Sharol Given today. Advised that the best treatment option at this point would be surgical intervention with Achilles tendon repair. Procedure along with possible risk/complications and recovery time discussed. All questions answered. Anticipate surgery we done within the next few days. Patient was put in a cam boot.  Follow-Up Instructions: Return in about 1 week (around 05/08/2017) for dr duda postop .   Orders:  Orders Placed This Encounter  Procedures  . XR Foot 2 Views Right   No orders of the defined types were placed in this encounter.     Procedures: No procedures performed   Clinical Data: No additional findings.   Subjective: Chief Complaint  Patient presents with  . Right Achilles Tendon - Injury    DOI: 04/30/2017    HPI 72-year-old active black male presents also with complaints of right posterior heel pain. States that yesterday he was playing tennis and while he was back pedaling he felt something "give" in his Achilles area. Discomfort and difficulty with weightbearing. He went to the [facility] and was advised to follow with orthopedics. Has had some posterior heel swelling.  Review of Systems  no current cardiac pulmonary GI GU issues Objective: Vital Signs: BP 133/81 (BP Location: Left Arm, Patient Position: Sitting)   Pulse 72   Ht 6\' 1"  (1.854 m)   Wt 178 lb (80.7 kg)   BMI 23.48 kg/m   Physical Exam  Constitutional: He is oriented to person, place, and time. He appears well-developed. No distress.  HENT:    Head: Normocephalic and atraumatic.  Eyes: Pupils are equal, round, and reactive to light.  Neck: Normal range of motion.  Pulmonary/Chest: No respiratory distress.  Musculoskeletal:  Exam gait is antalgic. Does have some swelling around his Achilles tendon. He does have tenderness and a palpable defect in the watershed area of the Achilles tendon. Calf is nontender. Neurovascularly intact. Ankle ligaments feel stable.  Neurological: He is alert and oriented to person, place, and time.  Skin: Skin is warm and dry.    Ortho Exam  Specialty Comments:  No specialty comments available.  Imaging: No results found.   PMFS History: Patient Active Problem List   Diagnosis Date Noted  . Seborrheic keratoses 08/21/2016  . Small bowel obstruction ([place]) 07/11/2014  . Tendinitis of forearm 04/19/2013  . LIPOMA 06/29/2010  . TINEA BARBAE 01/22/2010  . SMALL BOWEL OBSTRUCTION, HX OF 02/19/2007   Past Medical History:  Diagnosis Date  . Abnormal glucose   . History of small bowel obstruction    x2  . Pneumonia    as a child    Family History  Problem Relation Age of Onset  . Arthritis Mother   . Pulmonary embolism Father   . Cancer Neg Hx   . Diabetes Neg Hx   . Colon cancer Neg Hx     Past Surgical History:  Procedure Laterality  Date  . ACHILLES TENDON SURGERY Right 05/02/2017   Procedure: RIGHT ACHILLES RECONSTRUCTION;  Surgeon: Newt Minion, MD;  Location: [facility];  Service: Orthopedics;  Laterality: Right;  . COLONOSCOPY  2008  . LAPAROTOMY N/A 07/13/2014   Procedure: EXPLORATORY LAPAROTOMY lysis of adhesions;  Surgeon: Leighton Ruff, MD;  Location: [facility];  Service: General;  Laterality: N/A;  . RESECTION SMALL BOWEL / CLOSURE ILEOSTOMY  3 1/2 years ago, & 10 years ago   x2   Social History   Occupational History  . Not on file  Tobacco Use  . Smoking status: Former Smoker    Packs/day: 4.00    Types: Cigarettes    Start date: 04/15/1966    Last attempt to quit:  04/15/1970    Years since quitting: 47.0  . Smokeless tobacco: Never Used  Substance and Sexual Activity  . Alcohol use: No  . Drug use: No  . Sexual activity: Not on file

## 2017-05-01 NOTE — Progress Notes (Signed)
Spoke with pt for pre-op call. Pt denies cardiac history, HTN or diabetes.  

## 2017-05-02 ENCOUNTER — Ambulatory Visit (HOSPITAL_COMMUNITY): Payer: Medicare HMO | Admitting: Anesthesiology

## 2017-05-02 ENCOUNTER — Encounter (HOSPITAL_COMMUNITY)
Admission: RE | Disposition: A | Discharge status: Home / Self Care | Payer: Self-pay | Source: Ambulatory Visit | Attending: Orthopedic Surgery

## 2017-05-02 ENCOUNTER — Ambulatory Visit (HOSPITAL_COMMUNITY)
Admission: RE | Admit: 2017-05-02 | Discharge: 2017-05-02 | Disposition: A | Discharge status: Home / Self Care | Payer: Medicare HMO | Source: Ambulatory Visit | Attending: Orthopedic Surgery | Admitting: Orthopedic Surgery

## 2017-05-02 ENCOUNTER — Encounter (HOSPITAL_COMMUNITY): Payer: Self-pay | Admitting: General Practice

## 2017-05-02 DIAGNOSIS — X58XXXA Exposure to other specified factors, initial encounter: Secondary | ICD-10-CM | POA: Diagnosis not present

## 2017-05-02 DIAGNOSIS — K56609 Unspecified intestinal obstruction, unspecified as to partial versus complete obstruction: Secondary | ICD-10-CM | POA: Diagnosis not present

## 2017-05-02 DIAGNOSIS — Z87891 Personal history of nicotine dependence: Secondary | ICD-10-CM | POA: Insufficient documentation

## 2017-05-02 DIAGNOSIS — S86011A Strain of right Achilles tendon, initial encounter: Secondary | ICD-10-CM | POA: Diagnosis not present

## 2017-05-02 HISTORY — PX: ACHILLES TENDON SURGERY: SHX542

## 2017-05-02 HISTORY — DX: Pneumonia, unspecified organism: J18.9

## 2017-05-02 SURGERY — REPAIR, TENDON, ACHILLES
Anesthesia: General | Laterality: Right

## 2017-05-02 MED ORDER — LIDOCAINE HCL (CARDIAC) 20 MG/ML IV SOLN
INTRAVENOUS | Status: DC | PRN
  Administered 2017-05-02: 100 mg via INTRAVENOUS

## 2017-05-02 MED ORDER — PHENYLEPHRINE HCL 10 MG/ML IJ SOLN
INTRAMUSCULAR | Status: DC | PRN
  Administered 2017-05-02 (×6): 80 ug via INTRAVENOUS

## 2017-05-02 MED ORDER — PHENYLEPHRINE 40 MCG/ML (10ML) SYRINGE FOR IV PUSH (FOR BLOOD PRESSURE SUPPORT)
PREFILLED_SYRINGE | INTRAVENOUS | Status: AC
  Filled 2017-05-02: qty 50

## 2017-05-02 MED ORDER — LACTATED RINGERS IV SOLN
INTRAVENOUS | Status: DC | PRN
  Administered 2017-05-02: 13:00:00 via INTRAVENOUS

## 2017-05-02 MED ORDER — METOCLOPRAMIDE HCL 5 MG/ML IJ SOLN: 10.0000 mg | Freq: Once | INTRAMUSCULAR | Status: DC | PRN

## 2017-05-02 MED ORDER — CHLORHEXIDINE GLUCONATE 4 % EX LIQD: 60.0000 mL | Freq: Once | CUTANEOUS | Status: DC

## 2017-05-02 MED ORDER — 0.9 % SODIUM CHLORIDE (POUR BTL) OPTIME
TOPICAL | Status: DC | PRN
  Administered 2017-05-02: 1000 mL

## 2017-05-02 MED ORDER — OXYCODONE HCL 5 MG PO TABS
5.0000 mg | ORAL_TABLET | Freq: Once | ORAL | Status: AC
  Administered 2017-05-02: 10 mg via ORAL

## 2017-05-02 MED ORDER — ACETAMINOPHEN 325 MG PO TABS
650.0000 mg | ORAL_TABLET | Freq: Once | ORAL | Status: AC
  Administered 2017-05-02: 650 mg via ORAL

## 2017-05-02 MED ORDER — FENTANYL CITRATE (PF) 100 MCG/2ML IJ SOLN
INTRAMUSCULAR | Status: DC | PRN
  Administered 2017-05-02: 100 ug via INTRAVENOUS

## 2017-05-02 MED ORDER — LACTATED RINGERS IV SOLN
INTRAVENOUS | Status: DC
  Administered 2017-05-02: 13:00:00 via INTRAVENOUS

## 2017-05-02 MED ORDER — PROPOFOL 10 MG/ML IV BOLUS
INTRAVENOUS | Status: AC
  Filled 2017-05-02: qty 20

## 2017-05-02 MED ORDER — FENTANYL CITRATE (PF) 100 MCG/2ML IJ SOLN
INTRAMUSCULAR | Status: AC
  Administered 2017-05-02: 50 ug via INTRAVENOUS
  Filled 2017-05-02: qty 2

## 2017-05-02 MED ORDER — MEPERIDINE HCL 25 MG/ML IJ SOLN: 6.2500 mg | INTRAMUSCULAR | Status: DC | PRN

## 2017-05-02 MED ORDER — OXYCODONE-ACETAMINOPHEN 5-325 MG PO TABS: 1.0000 | ORAL_TABLET | ORAL | 0 refills | Status: DC | PRN

## 2017-05-02 MED ORDER — FENTANYL CITRATE (PF) 250 MCG/5ML IJ SOLN
INTRAMUSCULAR | Status: AC
  Filled 2017-05-02: qty 5

## 2017-05-02 MED ORDER — ONDANSETRON HCL 4 MG/2ML IJ SOLN
INTRAMUSCULAR | Status: DC | PRN
  Administered 2017-05-02: 4 mg via INTRAVENOUS

## 2017-05-02 MED ORDER — CEFAZOLIN SODIUM-DEXTROSE 2-4 GM/100ML-% IV SOLN
2.0000 g | INTRAVENOUS | Status: AC
  Administered 2017-05-02: 2 g via INTRAVENOUS
  Filled 2017-05-02: qty 100

## 2017-05-02 MED ORDER — PROPOFOL 10 MG/ML IV BOLUS
INTRAVENOUS | Status: DC | PRN
  Administered 2017-05-02: 200 mg via INTRAVENOUS

## 2017-05-02 MED ORDER — ACETAMINOPHEN 325 MG PO TABS
ORAL_TABLET | ORAL | Status: AC
  Administered 2017-05-02: 650 mg via ORAL
  Filled 2017-05-02: qty 2

## 2017-05-02 MED ORDER — OXYCODONE HCL 5 MG PO TABS
ORAL_TABLET | ORAL | Status: AC
  Administered 2017-05-02: 10 mg via ORAL
  Filled 2017-05-02: qty 2

## 2017-05-02 MED ORDER — FENTANYL CITRATE (PF) 100 MCG/2ML IJ SOLN
25.0000 ug | INTRAMUSCULAR | Status: DC | PRN
  Administered 2017-05-02 (×2): 50 ug via INTRAVENOUS

## 2017-05-02 SURGICAL SUPPLY — 35 items
BNDG CMPR 9X4 STRL LF SNTH (GAUZE/BANDAGES/DRESSINGS) ×1
BNDG COHESIVE 4X5 TAN STRL (GAUZE/BANDAGES/DRESSINGS) ×1 IMPLANT
BNDG COHESIVE 6X5 TAN STRL LF (GAUZE/BANDAGES/DRESSINGS) ×3 IMPLANT
BNDG ESMARK 4X9 LF (GAUZE/BANDAGES/DRESSINGS) ×1 IMPLANT
CANISTER SUCT 3000ML PPV (MISCELLANEOUS) ×2 IMPLANT
COVER SURGICAL LIGHT HANDLE (MISCELLANEOUS) ×3 IMPLANT
CUFF TOURNIQUET SINGLE 34IN LL (TOURNIQUET CUFF) IMPLANT
CUFF TOURNIQUET SINGLE 44IN (TOURNIQUET CUFF) IMPLANT
DRAPE U-SHAPE 47X51 STRL (DRAPES) ×2 IMPLANT
DRSG ADAPTIC 3X8 NADH LF (GAUZE/BANDAGES/DRESSINGS) ×2 IMPLANT
DURAPREP 26ML APPLICATOR (WOUND CARE) ×2 IMPLANT
ELECT REM PT RETURN 9FT ADLT (ELECTROSURGICAL) ×2
ELECTRODE REM PT RTRN 9FT ADLT (ELECTROSURGICAL) ×1 IMPLANT
GAUZE SPONGE 4X4 12PLY STRL (GAUZE/BANDAGES/DRESSINGS) ×2 IMPLANT
GAUZE SPONGE 4X4 12PLY STRL LF (GAUZE/BANDAGES/DRESSINGS) ×1 IMPLANT
GLOVE BIOGEL PI IND STRL 9 (GLOVE) ×1 IMPLANT
GLOVE BIOGEL PI INDICATOR 9 (GLOVE) ×2
GLOVE SURG ORTHO 9.0 STRL STRW (GLOVE) ×3 IMPLANT
GOWN STRL REUS W/ TWL XL LVL3 (GOWN DISPOSABLE) ×3 IMPLANT
GOWN STRL REUS W/TWL XL LVL3 (GOWN DISPOSABLE) ×6
KIT ROOM TURNOVER OR (KITS) ×2 IMPLANT
NDL SUT .5 MAYO 1.404X.05X (NEEDLE) ×1 IMPLANT
NEEDLE MAYO TAPER (NEEDLE) ×2
NS IRRIG 1000ML POUR BTL (IV SOLUTION) ×2 IMPLANT
PACK ORTHO EXTREMITY (CUSTOM PROCEDURE TRAY) ×2 IMPLANT
PAD ARMBOARD 7.5X6 YLW CONV (MISCELLANEOUS) ×3 IMPLANT
SPONGE LAP 18X18 X RAY DECT (DISPOSABLE) ×2 IMPLANT
SUT ETHILON 2 0 PSLX (SUTURE) ×4 IMPLANT
SUT FIBERWIRE #2 38 T-5 BLUE (SUTURE) ×4
SUTURE FIBERWR #2 38 T-5 BLUE (SUTURE) ×2 IMPLANT
TOWEL OR 17X24 6PK STRL BLUE (TOWEL DISPOSABLE) ×2 IMPLANT
TOWEL OR 17X26 10 PK STRL BLUE (TOWEL DISPOSABLE) ×2 IMPLANT
TUBE CONNECTING 12X1/4 (SUCTIONS) ×2 IMPLANT
WATER STERILE IRR 1000ML POUR (IV SOLUTION) ×1 IMPLANT
YANKAUER SUCT BULB TIP NO VENT (SUCTIONS) ×2 IMPLANT

## 2017-05-02 NOTE — Op Note (Signed)
05/02/2017  1:33 PM  PATIENT:  Richard Middleton    PRE-OPERATIVE DIAGNOSIS:  Right Achilles Rupture  POST-OPERATIVE DIAGNOSIS:  Same  PROCEDURE:  RIGHT ACHILLES RECONSTRUCTION  SURGEON:  Newt Minion, MD  PHYSICIAN ASSISTANT:None ANESTHESIA:   General  PREOPERATIVE INDICATIONS:  Richard Middleton is a  72 y.o. male with a diagnosis of Right Achilles Rupture who failed conservative measures and elected for surgical management.    The risks benefits and alternatives were discussed with the patient preoperatively including but not limited to the risks of infection, bleeding, nerve injury, cardiopulmonary complications, the need for revision surgery, among others, and the patient was willing to proceed.  OPERATIVE IMPLANTS: #2 FiberWire  OPERATIVE FINDINGS: Complete rupture of the Achilles tendon  OPERATIVE PROCEDURE: Patient was brought to the operating room and underwent a general anesthetic.  After adequate levels of anesthesia were obtained patient's right lower extremity was prepped using DuraPrep draped into a sterile field a timeout was called.  A posterior medial incision was made along the Achilles tendon this was carried down through the peritenon.  The Achilles was delivered to the surgical field was completely ruptured.  Using the #2 FiberWire with Krakw technique the proximal stump was secured with #2 FiberWire with 4 strands exiting distally.  Distal stump was also secured with #2 FiberWire with Krakw technique with 4 strands excellent at the proximal aspect.  The foot was plantarflexed and the Achilles tendon was repaired.  Patient had good range of motion of the ankle postoperatively.  The wound was irrigated with normal saline the peritenon was closed using 2-0 Monocryl the skin was closed using 2-0 nylon with Algower Donati suture technique.  A compressive dressing was applied patient was extubated taken to PACU in stable condition.   DISCHARGE PLANNING:  Antibiotic duration:  Perioperative only   Weightbearing: nonweightbearing and elevation of the right lower extremity with the fracture boot in place   Pain medication: Percocet   Dressing care/ Wound BWI:[id number] dressing to remain in place for 1 week  Ambulatory devices: Crutches  Discharge to: Home  Follow-up: In the office 1 week post operative.

## 2017-05-02 NOTE — H&P (Signed)
Richard Middleton is an 72 y.o. male.   Chief Complaint: Acute right Achilles tendon rupture. HPI: Patient is a 72 year old gentleman who sustained an acute pop and rupture of the right Achilles tendon patient was unable to continue activities after the rupture.  Past Medical History:  Diagnosis Date  . Abnormal glucose   . History of small bowel obstruction    x2  . Pneumonia    as a child    Past Surgical History:  Procedure Laterality Date  . COLONOSCOPY  2008  . LAPAROTOMY N/A 07/13/2014   Procedure: EXPLORATORY LAPAROTOMY lysis of adhesions;  Surgeon: Leighton Ruff, MD;  Location: [facility];  Service: General;  Laterality: N/A;  . RESECTION SMALL BOWEL / CLOSURE ILEOSTOMY  3 1/2 years ago, & 10 years ago   x2    Family History  Problem Relation Age of Onset  . Arthritis Mother   . Pulmonary embolism Father   . Cancer Neg Hx   . Diabetes Neg Hx   . Colon cancer Neg Hx    Social History:  reports that he quit smoking about 47 years ago. His smoking use included cigarettes. He started smoking about 51 years ago. He smoked 4.00 packs per day. he has never used smokeless tobacco. He reports that he does not drink alcohol or use drugs.  Allergies: No Known Allergies  No medications prior to admission.    No results found for this or any previous visit (from the past 48 hour(s)). No results found.  Review of Systems  All other systems reviewed and are negative.   There were no vitals taken for this visit. Physical Exam  Examination patient is alert oriented no adenopathy well-dressed normal affect normal respiratory effort he has an antalgic gait.  Examination is a palpable defect of the mid substance of the Achilles.  Compression of the calf does not reproduce plantarflexion on the right.  This does reproduce plantarflexion on the left.  He has a good dorsalis pedis pulse there are no skin abrasions. Assessment/Plan Assessment: Acute mid substance rupture right Achilles  tendon.  Plan: We will plan for right Achilles tendon reconstruction.  Risk and benefits were discussed including infection neurovascular injury nonhealing the wound need for additional surgery.  Patient states he understands wished to proceed at this time.  Newt Minion, MD 05/02/2017, 6:36 AM

## 2017-05-02 NOTE — Anesthesia Postprocedure Evaluation (Signed)
Anesthesia Post Note  Patient: Richard Middleton  Procedure(s) Performed: RIGHT ACHILLES RECONSTRUCTION (Right )     Patient location during evaluation: PACU Anesthesia Type: General Level of consciousness: awake and alert Pain management: pain level controlled Vital Signs Assessment: post-procedure vital signs reviewed and stable Respiratory status: spontaneous breathing, nonlabored ventilation, respiratory function stable and patient connected to nasal cannula oxygen Cardiovascular status: blood pressure returned to baseline and stable Postop Assessment: no apparent nausea or vomiting Anesthetic complications: no    Last Vitals:  Vitals:   05/02/17 1208 05/02/17 1339  BP: 139/82 139/82  Pulse: 71 (!) 32  Resp: 18 18  Temp: 36.6 C (!) 36.2 C  SpO2: 100% 100%    Last Pain:  Vitals:   05/02/17 1339  TempSrc:   PainSc: 0-No pain                 Montez Hageman

## 2017-05-02 NOTE — Anesthesia Procedure Notes (Signed)
Procedure Name: LMA Insertion Date/Time: 05/02/2017 12:52 PM Performed by: Purvis Kilts, CRNA Pre-anesthesia Checklist: Patient identified, Emergency Drugs available, Suction available, Patient being monitored and Timeout performed Patient Re-evaluated:Patient Re-evaluated prior to induction Oxygen Delivery Method: Circle system utilized Preoxygenation: Pre-oxygenation with 100% oxygen Induction Type: IV induction Ventilation: Mask ventilation without difficulty LMA: LMA inserted LMA Size: 5.0 Number of attempts: 1 Placement Confirmation: positive ETCO2 and breath sounds checked- equal and bilateral Tube secured with: Tape Dental Injury: Teeth and Oropharynx as per pre-operative assessment

## 2017-05-02 NOTE — Anesthesia Preprocedure Evaluation (Addendum)
Anesthesia Evaluation  Patient identified by MRN, date of birth, ID band Patient awake    Reviewed: Allergy & Precautions, NPO status , Patient's Chart, lab work & pertinent test results  Airway Mallampati: II  TM Distance: >3 FB Neck ROM: Full    Dental no notable dental hx.    Pulmonary neg pulmonary ROS, former smoker,    Pulmonary exam normal breath sounds clear to auscultation       Cardiovascular negative cardio ROS Normal cardiovascular exam Rhythm:Regular Rate:Normal     Neuro/Psych negative neurological ROS  negative psych ROS   GI/Hepatic negative GI ROS, Neg liver ROS,   Endo/Other  negative endocrine ROS  Renal/GU negative Renal ROS  negative genitourinary   Musculoskeletal negative musculoskeletal ROS (+)   Abdominal   Peds negative pediatric ROS (+)  Hematology negative hematology ROS (+)   Anesthesia Other Findings   Reproductive/Obstetrics negative OB ROS                            Anesthesia Physical Anesthesia Plan  ASA: II  Anesthesia Plan: General   Post-op Pain Management:    Induction: Intravenous  PONV Risk Score and Plan: 2 and Treatment may vary due to age or medical condition and Ondansetron  Airway Management Planned: LMA and Oral ETT  Additional Equipment:   Intra-op Plan:   Post-operative Plan: Extubation in OR  Informed Consent: I have reviewed the patients History and Physical, chart, labs and discussed the procedure including the risks, benefits and alternatives for the proposed anesthesia with the patient or authorized representative who has indicated his/her understanding and acceptance.   Dental advisory given  Plan Discussed with: CRNA  Anesthesia Plan Comments:         Anesthesia Quick Evaluation

## 2017-05-02 NOTE — Transfer of Care (Signed)
Immediate Anesthesia Transfer of Care Note  Patient: Richard Middleton  Procedure(s) Performed: RIGHT ACHILLES RECONSTRUCTION (Right )  Patient Location: PACU  Anesthesia Type:General  Level of Consciousness: awake  Airway & Oxygen Therapy: Patient Spontanous Breathing  Post-op Assessment: Report given to RN and Post -op Vital signs reviewed and stable  Post vital signs: Reviewed and stable  Last Vitals:  Vitals:   05/02/17 1208 05/02/17 1339  BP: 139/82 139/82  Pulse: 71 (!) 32  Resp: 18 18  Temp: 36.6 C (!) 36.2 C  SpO2: 100% 100%    Last Pain:  Vitals:   05/02/17 1227  TempSrc:   PainSc: 3       Patients Stated Pain Goal: 2 (75/43/60 6770)  Complications: No apparent anesthesia complications

## 2017-05-03 ENCOUNTER — Encounter (HOSPITAL_COMMUNITY): Payer: Self-pay | Admitting: Orthopedic Surgery

## 2017-05-08 ENCOUNTER — Ambulatory Visit (INDEPENDENT_AMBULATORY_CARE_PROVIDER_SITE_OTHER): Payer: Medicare HMO | Admitting: Orthopedic Surgery

## 2017-05-08 ENCOUNTER — Encounter (INDEPENDENT_AMBULATORY_CARE_PROVIDER_SITE_OTHER): Payer: Self-pay | Admitting: Orthopedic Surgery

## 2017-05-08 VITALS — Ht 73.0 in | Wt 178.0 lb

## 2017-05-08 DIAGNOSIS — S86011A Strain of right Achilles tendon, initial encounter: Secondary | ICD-10-CM

## 2017-05-08 NOTE — Progress Notes (Signed)
   Post-Op Visit Note   Patient: Richard Middleton           Date of Birth: 1946-02-07           MRN: [id number] Visit Date: 05/08/2017 PCP: Martinique, Betty G, MD  Chief Complaint:  Chief Complaint  Patient presents with  . Right Ankle - Routine Post Op    05/02/17 Right Achilles Reconstruction    HPI:  HPI The patient is a 72 year old gentleman who presents today 1 week status post right achilles reconstruction. Is full weight bearing in CAM with heel lifts. Wonders what his weight bearing and activity status is.  Ortho Exam Incision well approximated and healing well. No erythema. Minimal swelling. No sign of infection.  Visit Diagnoses:  1. Achilles tendon tear, right, initial encounter     Plan:  Begin daily dial soap cleansing. Apply dry dressing. May shower. WTBAT in cam with lifts. Follow up in 1 week for suture removal.  Follow-Up Instructions: Return in about 1 week (around 05/15/2017).   Imaging: No results found.  Orders:  No orders of the defined types were placed in this encounter.  No orders of the defined types were placed in this encounter.    PMFS History: Patient Active Problem List   Diagnosis Date Noted  . Seborrheic keratoses 08/21/2016  . Small bowel obstruction ([place]) 07/11/2014  . Tendinitis of forearm 04/19/2013  . LIPOMA 06/29/2010  . TINEA BARBAE 01/22/2010  . SMALL BOWEL OBSTRUCTION, HX OF 02/19/2007   Past Medical History:  Diagnosis Date  . Abnormal glucose   . History of small bowel obstruction    x2  . Pneumonia    as a child    Family History  Problem Relation Age of Onset  . Arthritis Mother   . Pulmonary embolism Father   . Cancer Neg Hx   . Diabetes Neg Hx   . Colon cancer Neg Hx     Past Surgical History:  Procedure Laterality Date  . ACHILLES TENDON SURGERY Right 05/02/2017   Procedure: RIGHT ACHILLES RECONSTRUCTION;  Surgeon: Newt Minion, MD;  Location: [facility];  Service: Orthopedics;  Laterality: Right;  . COLONOSCOPY   2008  . LAPAROTOMY N/A 07/13/2014   Procedure: EXPLORATORY LAPAROTOMY lysis of adhesions;  Surgeon: Leighton Ruff, MD;  Location: [facility];  Service: General;  Laterality: N/A;  . RESECTION SMALL BOWEL / CLOSURE ILEOSTOMY  3 1/2 years ago, & 10 years ago   x2   Social History   Occupational History  . Not on file  Tobacco Use  . Smoking status: Former Smoker    Packs/day: 4.00    Types: Cigarettes    Start date: 04/15/1966    Last attempt to quit: 04/15/1970    Years since quitting: 47.0  . Smokeless tobacco: Never Used  Substance and Sexual Activity  . Alcohol use: No  . Drug use: No  . Sexual activity: Not on file

## 2017-05-14 ENCOUNTER — Encounter (INDEPENDENT_AMBULATORY_CARE_PROVIDER_SITE_OTHER): Payer: Self-pay | Admitting: Family

## 2017-05-14 ENCOUNTER — Ambulatory Visit (INDEPENDENT_AMBULATORY_CARE_PROVIDER_SITE_OTHER): Payer: Medicare HMO | Admitting: Family

## 2017-05-14 DIAGNOSIS — S86011D Strain of right Achilles tendon, subsequent encounter: Secondary | ICD-10-CM

## 2017-05-14 NOTE — Progress Notes (Signed)
   Post-Op Visit Note   Patient: Richard Middleton           Date of Birth: 12/12/1945           MRN: [id number] Visit Date: 05/14/2017 PCP: Martinique, Betty G, MD  Chief Complaint:  No chief complaint on file.   HPI:  HPI The patient is a 72 year old gentleman who presents today 2 weeks status post right achilles reconstruction. Is full weight bearing in CAM with heel lifts. Eager to continue progressing with his recovery.  Ortho Exam Incision well approximated and healing well. No erythema. Minimal swelling. No sign of infection.  Visit Diagnoses:  1. Achilles tendon tear, right, subsequent encounter     Plan:  Begin daily dial soap cleansing. Apply dry dressing. May shower. WTBAT in cam with lifts. Sutures harvested today. Anticipate advancing weight bearing at next visit.  Follow-Up Instructions: Return in about 2 weeks (around 05/28/2017).   Imaging: No results found.  Orders:  No orders of the defined types were placed in this encounter.  No orders of the defined types were placed in this encounter.    PMFS History: Patient Active Problem List   Diagnosis Date Noted  . Achilles tendon tear, right, subsequent encounter 05/14/2017  . Seborrheic keratoses 08/21/2016  . Small bowel obstruction ([place]) 07/11/2014  . Tendinitis of forearm 04/19/2013  . LIPOMA 06/29/2010  . TINEA BARBAE 01/22/2010  . SMALL BOWEL OBSTRUCTION, HX OF 02/19/2007   Past Medical History:  Diagnosis Date  . Abnormal glucose   . History of small bowel obstruction    x2  . Pneumonia    as a child    Family History  Problem Relation Age of Onset  . Arthritis Mother   . Pulmonary embolism Father   . Cancer Neg Hx   . Diabetes Neg Hx   . Colon cancer Neg Hx     Past Surgical History:  Procedure Laterality Date  . ACHILLES TENDON SURGERY Right 05/02/2017   Procedure: RIGHT ACHILLES RECONSTRUCTION;  Surgeon: Newt Minion, MD;  Location: [facility];  Service: Orthopedics;  Laterality: Right;  .  COLONOSCOPY  2008  . LAPAROTOMY N/A 07/13/2014   Procedure: EXPLORATORY LAPAROTOMY lysis of adhesions;  Surgeon: Leighton Ruff, MD;  Location: [facility];  Service: General;  Laterality: N/A;  . RESECTION SMALL BOWEL / CLOSURE ILEOSTOMY  3 1/2 years ago, & 10 years ago   x2   Social History   Occupational History  . Not on file  Tobacco Use  . Smoking status: Former Smoker    Packs/day: 4.00    Types: Cigarettes    Start date: 04/15/1966    Last attempt to quit: 04/15/1970    Years since quitting: 47.1  . Smokeless tobacco: Never Used  Substance and Sexual Activity  . Alcohol use: No  . Drug use: No  . Sexual activity: Not on file

## 2017-05-15 ENCOUNTER — Ambulatory Visit (INDEPENDENT_AMBULATORY_CARE_PROVIDER_SITE_OTHER): Payer: Medicare HMO | Admitting: Family

## 2017-05-16 ENCOUNTER — Telehealth (INDEPENDENT_AMBULATORY_CARE_PROVIDER_SITE_OTHER): Payer: Self-pay | Admitting: Orthopedic Surgery

## 2017-05-16 NOTE — Telephone Encounter (Signed)
Patient called and wanted to know if it is okay to take a shower?  CB#[phone].  Thank you.

## 2017-05-16 NOTE — Telephone Encounter (Signed)
I called and advised pt that it is ok to shower.

## 2017-05-28 ENCOUNTER — Ambulatory Visit (INDEPENDENT_AMBULATORY_CARE_PROVIDER_SITE_OTHER): Payer: Medicare HMO | Admitting: Orthopedic Surgery

## 2017-05-28 ENCOUNTER — Encounter (INDEPENDENT_AMBULATORY_CARE_PROVIDER_SITE_OTHER): Payer: Self-pay | Admitting: Orthopedic Surgery

## 2017-05-28 VITALS — Ht 73.0 in | Wt 178.0 lb

## 2017-05-28 DIAGNOSIS — S86011D Strain of right Achilles tendon, subsequent encounter: Secondary | ICD-10-CM

## 2017-05-28 NOTE — Progress Notes (Signed)
Office Visit Note   Patient: Richard Middleton           Date of Birth: 07-05-1945           MRN: [id number] Visit Date: 05/28/2017              Requested by: Martinique, Betty G, MD [address] [place], [place] [address] PCP: Martinique, Betty G, MD  Chief Complaint  Patient presents with  . Right Foot - Routine Post Op    05/02/17 right achilles reconstruction       HPI: Patient is a 72 year old gentleman who is 1 month status post reconstruction right Achilles tendon he walks in with regular shoewear no heel lifts.  Assessment & Plan: Visit Diagnoses:  1. Achilles tendon tear, right, subsequent encounter     Plan: Continue with regular walking if he develops symptoms recommended getting back in the boot with a heel lift.  He will not try to strengthen her train his leg for another month.  Recommended scar massage.  Follow-Up Instructions: Return if symptoms worsen or fail to improve.   Ortho Exam  Patient is alert, oriented, no adenopathy, well-dressed, normal affect, normal respiratory effort. Examination the incision is well-healed there is no cellulitis no drainage there is no keloiding of the scar.  Compression of the calf reproduces plantarflexion of the foot he has dorsiflexion to neutral with his knee extended.  Imaging: No results found. No images are attached to the encounter.  Labs: Lab Results  Component Value Date   HGBA1C 5.7 04/22/2007   HGBA1C 6.2 (H) 01/12/2007   HGBA1C 6.2 01/12/2007    @LABSALLVALUES (HGBA1)@  Body mass index is 23.48 kg/m.  Orders:  No orders of the defined types were placed in this encounter.  No orders of the defined types were placed in this encounter.    Procedures: No procedures performed  Clinical Data: No additional findings.  ROS:  All other systems negative, except as noted in the HPI. Review of Systems  Objective: Vital Signs: Ht 6\' 1"  (1.854 m)   Wt 178 lb (80.7 kg)   BMI 23.48 kg/m   Specialty  Comments:  No specialty comments available.  PMFS History: Patient Active Problem List   Diagnosis Date Noted  . Achilles tendon tear, right, subsequent encounter 05/14/2017  . Seborrheic keratoses 08/21/2016  . Small bowel obstruction ([place]) 07/11/2014  . Tendinitis of forearm 04/19/2013  . LIPOMA 06/29/2010  . TINEA BARBAE 01/22/2010  . SMALL BOWEL OBSTRUCTION, HX OF 02/19/2007   Past Medical History:  Diagnosis Date  . Abnormal glucose   . History of small bowel obstruction    x2  . Pneumonia    as a child    Family History  Problem Relation Age of Onset  . Arthritis Mother   . Pulmonary embolism Father   . Cancer Neg Hx   . Diabetes Neg Hx   . Colon cancer Neg Hx     Past Surgical History:  Procedure Laterality Date  . ACHILLES TENDON SURGERY Right 05/02/2017   Procedure: RIGHT ACHILLES RECONSTRUCTION;  Surgeon: Newt Minion, MD;  Location: [facility];  Service: Orthopedics;  Laterality: Right;  . COLONOSCOPY  2008  . LAPAROTOMY N/A 07/13/2014   Procedure: EXPLORATORY LAPAROTOMY lysis of adhesions;  Surgeon: Leighton Ruff, MD;  Location: [facility];  Service: General;  Laterality: N/A;  . RESECTION SMALL BOWEL / CLOSURE ILEOSTOMY  3 1/2 years ago, & 10 years ago   x2  Social History   Occupational History  . Not on file  Tobacco Use  . Smoking status: Former Smoker    Packs/day: 4.00    Types: Cigarettes    Start date: 04/15/1966    Last attempt to quit: 04/15/1970    Years since quitting: 47.1  . Smokeless tobacco: Never Used  Substance and Sexual Activity  . Alcohol use: No  . Drug use: No  . Sexual activity: Not on file

## 2017-06-02 ENCOUNTER — Telehealth (INDEPENDENT_AMBULATORY_CARE_PROVIDER_SITE_OTHER): Payer: Self-pay

## 2017-06-02 NOTE — Telephone Encounter (Signed)
Patient called this morning. States he had Right Achilles reconstruction 05/02/17. He was last seen 05/28/17. Everything is going well. States he noticed yesterday a small split on his heel and is applying ointment and wrapping it. He would like to know if this is something he should be concerned or not. Doesn't know if he is over doing it. Would like a Callback. If no answer, Leave a Detailed message per patient.   CB [phone]

## 2017-06-02 NOTE — Telephone Encounter (Signed)
Can you please call and make appt. Unable to advise without eval by doctor.

## 2017-06-02 NOTE — Telephone Encounter (Signed)
appointment made

## 2017-06-04 ENCOUNTER — Encounter (INDEPENDENT_AMBULATORY_CARE_PROVIDER_SITE_OTHER): Payer: Self-pay | Admitting: Orthopedic Surgery

## 2017-06-04 ENCOUNTER — Ambulatory Visit (INDEPENDENT_AMBULATORY_CARE_PROVIDER_SITE_OTHER): Payer: Medicare HMO | Admitting: Orthopedic Surgery

## 2017-06-04 VITALS — Ht 73.0 in | Wt 178.0 lb

## 2017-06-04 DIAGNOSIS — S86011D Strain of right Achilles tendon, subsequent encounter: Secondary | ICD-10-CM

## 2017-06-04 NOTE — Progress Notes (Signed)
Office Visit Note   Patient: Richard Middleton           Date of Birth: 1946-04-02           MRN: [id number] Visit Date: 06/04/2017              Requested by: Martinique, Betty G, MD [address] [place], [place] [address] PCP: Martinique, Betty G, MD  Chief Complaint  Patient presents with  . Right Leg - Follow-up    05/02/17 right Achilles reconstruction      HPI: Patient is a 72 year old gentleman who is status post reconstruction right Achilles tendon on 05/02/17. he walks in with regular shoewear no heel lifts. Seen today for concern of opening up of his incision after he has been repainting all his kitchen cabinets for last few days.   Has been applying bandaids and ace wraps.  Assessment & Plan: Visit Diagnoses:  1. Achilles tendon tear, right, subsequent encounter     Plan: reassurance provided. Neosporin and bandaid to incision until healed.  Continue with regular shoewear. if he develops symptoms recommended getting back in the boot with a heel lift. He will not try to strengthen her train his leg for another month.  Recommended scar massage.  Follow-Up Instructions: No Follow-up on file.   Ortho Exam  Patient is alert, oriented, no adenopathy, well-dressed, normal affect, normal respiratory effort. Examination the majority of the incision is well-healed. 2 mm length of proud granulation distally. No drainage. No erythema. There is no keloiding of the scar.  Compression of the calf reproduces plantarflexion of the foot he has dorsiflexion to neutral with his knee extended.  Imaging: No results found. No images are attached to the encounter.  Labs: Lab Results  Component Value Date   HGBA1C 5.7 04/22/2007   HGBA1C 6.2 (H) 01/12/2007   HGBA1C 6.2 01/12/2007    @LABSALLVALUES (HGBA1)@  Body mass index is 23.48 kg/m.  Orders:  No orders of the defined types were placed in this encounter.  No orders of the defined types were placed in this encounter.    Procedures: No procedures performed  Clinical Data: No additional findings.  ROS:  All other systems negative, except as noted in the HPI. Review of Systems  Constitutional: Negative for chills and fever.  Cardiovascular: Negative for leg swelling.  Skin: Positive for wound.    Objective: Vital Signs: Ht 6\' 1"  (1.854 m)   Wt 178 lb (80.7 kg)   BMI 23.48 kg/m   Specialty Comments:  No specialty comments available.  PMFS History: Patient Active Problem List   Diagnosis Date Noted  . Achilles tendon tear, right, subsequent encounter 05/14/2017  . Seborrheic keratoses 08/21/2016  . Small bowel obstruction ([facility]) 07/11/2014  . Tendinitis of forearm 04/19/2013  . LIPOMA 06/29/2010  . TINEA BARBAE 01/22/2010  . SMALL BOWEL OBSTRUCTION, HX OF 02/19/2007   Past Medical History:  Diagnosis Date  . Abnormal glucose   . History of small bowel obstruction    x2  . Pneumonia    as a child    Family History  Problem Relation Age of Onset  . Arthritis Mother   . Pulmonary embolism Father   . Cancer Neg Hx   . Diabetes Neg Hx   . Colon cancer Neg Hx     Past Surgical History:  Procedure Laterality Date  . ACHILLES TENDON SURGERY Right 05/02/2017   Procedure: RIGHT ACHILLES RECONSTRUCTION;  Surgeon: Newt Minion, MD;  Location: [facility];  Service: Orthopedics;  Laterality: Right;  . COLONOSCOPY  2008  . LAPAROTOMY N/A 07/13/2014   Procedure: EXPLORATORY LAPAROTOMY lysis of adhesions;  Surgeon: Leighton Ruff, MD;  Location[facility];  Service: General;  Laterality: N/A;  . RESECTION SMALL BOWEL / CLOSURE ILEOSTOMY  3 1/2 years ago, & 10 years ago   x2   Social History   Occupational History  . Not on file  Tobacco Use  . Smoking status: Former Smoker    Packs/day: 4.00    Types: Cigarettes    Start date: 04/15/1966    Last attempt to quit: 04/15/1970    Years since quitting: 47.1  . Smokeless tobacco: Never Used  Substance and Sexual Activity  . Alcohol use: No  . Drug  use: No  . Sexual activity: Not on file

## 2017-06-19 ENCOUNTER — Telehealth (INDEPENDENT_AMBULATORY_CARE_PROVIDER_SITE_OTHER): Payer: Self-pay | Admitting: Orthopedic Surgery

## 2017-06-19 NOTE — Telephone Encounter (Signed)
Patient has two questions - can he do light/moderate exercise for the achilles? When he is lying down, does he need to keep his feet elevated? Please advise # [phone]. Can leave message if no answer.

## 2017-06-20 NOTE — Telephone Encounter (Signed)
Called and lm on vm to advise that he can participate in exercise that he feels he can tolerate and that he does not have to elevate when he is lying down that his feet are already in line with his heart and does not need additional pillows for elevation

## 2017-07-07 ENCOUNTER — Encounter (INDEPENDENT_AMBULATORY_CARE_PROVIDER_SITE_OTHER): Payer: Self-pay | Admitting: Orthopedic Surgery

## 2017-07-07 ENCOUNTER — Ambulatory Visit (INDEPENDENT_AMBULATORY_CARE_PROVIDER_SITE_OTHER): Payer: Medicare HMO | Admitting: Orthopedic Surgery

## 2017-07-07 VITALS — Ht 73.0 in | Wt 178.0 lb

## 2017-07-07 DIAGNOSIS — S86011D Strain of right Achilles tendon, subsequent encounter: Secondary | ICD-10-CM

## 2017-07-07 NOTE — Progress Notes (Signed)
Office Visit Note   Patient: Richard Middleton           Date of Birth: November 11, 1945           MRN: [id number] Visit Date: 07/07/2017              Requested by: Martinique, Betty G, MD [address] [place], [place] [address] PCP: Martinique, Betty G, MD  Chief Complaint  Patient presents with  . Right Foot - Routine Post Op    05/02/17 right achilles recon      HPI: Patient is a 72 year old gentleman who presents about 9 weeks status post right Achilles tendon reconstruction.  Patient has no pain no symptoms he is concerned about swelling in the mid substance of the repair.  Assessment & Plan: Visit Diagnoses:  1. Achilles tendon tear, right, subsequent encounter     Plan: Recommended compression stockings knee-high and he can wear his elastic support on top of this.  Recommended continue with scar massage he has no restrictions at this time.  Discussed that this is some swelling at the healing site and he has some mild keloiding of the scar and the scar massage and compression of the swelling should resolve over the Achilles tendon will always be larger than the tendon on the opposite side.  Follow-Up Instructions: Return if symptoms worsen or fail to improve.   Ortho Exam  Patient is alert, oriented, no adenopathy, well-dressed, normal affect, normal respiratory effort. Examination patient has good range of motion of the ankle.  There is slight keloiding of the scar.  There is no redness no cellulitis no cystic changes.  There is some thickening at the healing site of the tendon.  The tendon is intact with good continuity.  Imaging: No results found. No images are attached to the encounter.  Labs: Lab Results  Component Value Date   HGBA1C 5.7 04/22/2007   HGBA1C 6.2 (H) 01/12/2007   HGBA1C 6.2 01/12/2007    @LABSALLVALUES (HGBA1)@  Body mass index is 23.48 kg/m.  Orders:  No orders of the defined types were placed in this encounter.  No orders of the defined types  were placed in this encounter.    Procedures: No procedures performed  Clinical Data: No additional findings.  ROS:  All other systems negative, except as noted in the HPI. Review of Systems  Objective: Vital Signs: Ht 6\' 1"  (1.854 m)   Wt 178 lb (80.7 kg)   BMI 23.48 kg/m   Specialty Comments:  No specialty comments available.  PMFS History: Patient Active Problem List   Diagnosis Date Noted  . Achilles tendon tear, right, subsequent encounter 05/14/2017  . Seborrheic keratoses 08/21/2016  . Small bowel obstruction ([place]) 07/11/2014  . Tendinitis of forearm 04/19/2013  . LIPOMA 06/29/2010  . TINEA BARBAE 01/22/2010  . SMALL BOWEL OBSTRUCTION, HX OF 02/19/2007   Past Medical History:  Diagnosis Date  . Abnormal glucose   . History of small bowel obstruction    x2  . Pneumonia    as a child    Family History  Problem Relation Age of Onset  . Arthritis Mother   . Pulmonary embolism Father   . Cancer Neg Hx   . Diabetes Neg Hx   . Colon cancer Neg Hx     Past Surgical History:  Procedure Laterality Date  . ACHILLES TENDON SURGERY Right 05/02/2017   Procedure: RIGHT ACHILLES RECONSTRUCTION;  Surgeon: Newt Minion, MD;  Location: [facility];  Service:  Orthopedics;  Laterality: Right;  . COLONOSCOPY  2008  . LAPAROTOMY N/A 07/13/2014   Procedure: EXPLORATORY LAPAROTOMY lysis of adhesions;  Surgeon: Leighton Ruff, MD;  Location: [facility];  Service: General;  Laterality: N/A;  . RESECTION SMALL BOWEL / CLOSURE ILEOSTOMY  3 1/2 years ago, & 10 years ago   x2   Social History   Occupational History  . Not on file  Tobacco Use  . Smoking status: Former Smoker    Packs/day: 4.00    Types: Cigarettes    Start date: 04/15/1966    Last attempt to quit: 04/15/1970    Years since quitting: 47.2  . Smokeless tobacco: Never Used  Substance and Sexual Activity  . Alcohol use: No  . Drug use: No  . Sexual activity: Not on file

## 2017-08-08 DIAGNOSIS — Z8249 Family history of ischemic heart disease and other diseases of the circulatory system: Secondary | ICD-10-CM | POA: Diagnosis not present

## 2017-08-08 DIAGNOSIS — R030 Elevated blood-pressure reading, without diagnosis of hypertension: Secondary | ICD-10-CM | POA: Diagnosis not present

## 2017-08-08 DIAGNOSIS — N529 Male erectile dysfunction, unspecified: Secondary | ICD-10-CM | POA: Diagnosis not present

## 2017-08-08 DIAGNOSIS — Z87891 Personal history of nicotine dependence: Secondary | ICD-10-CM | POA: Diagnosis not present

## 2017-08-25 ENCOUNTER — Encounter: Payer: Medicare HMO | Admitting: Family Medicine

## 2017-08-26 NOTE — Progress Notes (Signed)
HPI:  Richard Middleton is a 72 y.o.male here today for his routine physical examination.  Last CPE: 08/21/2016.  He lives with his wife and 82 years old son.  Regular exercise 3 or more times per week: Plays tennis 2 times per months, walks and active in his gardner. Following a healthy diet: Yes   Hx of STD's: Denies.  Immunization History  Administered Date(s) Administered  . Influenza Split 01/24/2012  . Influenza Whole 01/12/2007, 01/05/2008, 02/01/2009, 01/09/2010  . Influenza, High Dose Seasonal PF 12/22/2015  . Influenza,inj,Quad PF,6+ Mos 01/11/2013  . Pneumococcal Conjugate-13 08/18/2015  . Pneumococcal Polysaccharide-23 01/24/2012, 08/27/2017  . Td 08/01/2006  . Zoster 07/21/2007      -Hep C screening 08/2016, negative.   Last colon cancer screening: 10/2016, 5 years follow up recommended (per pt report). Last prostate ca screening: 08/2016 was normal at 0.31. Denies dysuria,increased urinary frequency, gross hematuria,or decreased urine output. Nocturia 1-2 times,stable for years.  Former smoker Denies high alcohol intake or Hx of illicit drug use. Wine 1-2 times per week.  Abdominal CT in 06/2014 was negative for aortic aneurysm.  Concerns today: ED, decreased endurance and sometimes decreased libido.    He has not tried medication before. Problem has been going for a while. Intermittent. No urethral discharge, genital lesions,or deformities.  Hx of BPH with nocturia.   Review of Systems  Constitutional: Negative for activity change, appetite change, fatigue and fever.  HENT: Negative for dental problem, nosebleeds, sore throat and trouble swallowing.   Eyes: Negative for redness and visual disturbance.  Respiratory: Negative for cough, shortness of breath and wheezing.   Cardiovascular: Negative for chest pain, palpitations and leg swelling.  Gastrointestinal: Negative for abdominal pain, nausea and vomiting.  Endocrine: Negative for cold  intolerance, heat intolerance, polydipsia, polyphagia and polyuria.  Genitourinary: Negative for decreased urine volume, dysuria, genital sores, hematuria and testicular pain.  Musculoskeletal: Negative for back pain, gait problem and myalgias.  Skin: Negative for color change and rash.  Allergic/Immunologic: Negative for environmental allergies.  Neurological: Negative for syncope, weakness and headaches.  Hematological: Negative for adenopathy. Does not bruise/bleed easily.  Psychiatric/Behavioral: Negative for confusion and sleep disturbance. The patient is not nervous/anxious.   All other systems reviewed and are negative.    Current Outpatient Medications on File Prior to Visit  Medication Sig Dispense Refill  . cholecalciferol (VITAMIN D) 1000 units tablet Take 1 capsule by mouth daily.     Current Facility-Administered Medications on File Prior to Visit  Medication Dose Route Frequency Provider Last Rate Last Dose  . 0.9 %  sodium chloride infusion  500 mL Intravenous Continuous Doran Stabler, MD         Past Medical History:  Diagnosis Date  . Abnormal glucose   . History of small bowel obstruction    x2  . Pneumonia    as a child    Past Surgical History:  Procedure Laterality Date  . ACHILLES TENDON SURGERY Right 05/02/2017   Procedure: RIGHT ACHILLES RECONSTRUCTION;  Surgeon: Newt Minion, MD;  Location: [facility];  Service: Orthopedics;  Laterality: Right;  . COLONOSCOPY  2008  . LAPAROTOMY N/A 07/13/2014   Procedure: EXPLORATORY LAPAROTOMY lysis of adhesions;  Surgeon: Leighton Ruff, MD;  Location: [facility];  Service: General;  Laterality: N/A;  . RESECTION SMALL BOWEL / CLOSURE ILEOSTOMY  3 1/2 years ago, & 10 years ago   x2    No Known Allergies  Family History  Problem Relation Age of Onset  . Arthritis Mother   . Pulmonary embolism Father   . Cancer Neg Hx   . Diabetes Neg Hx   . Colon cancer Neg Hx     Social History   Socioeconomic History  .  Marital status: Married    Spouse name: Not on file  . Number of children: Not on file  . Years of education: Not on file  . Highest education level: Not on file  Occupational History  . Not on file  Social Needs  . Financial resource strain: Not on file  . Food insecurity:    Worry: Not on file    Inability: Not on file  . Transportation needs:    Medical: Not on file    Non-medical: Not on file  Tobacco Use  . Smoking status: Former Smoker    Packs/day: 4.00    Types: Cigarettes    Start date: 04/15/1966    Last attempt to quit: 04/15/1970    Years since quitting: 47.4  . Smokeless tobacco: Never Used  Substance and Sexual Activity  . Alcohol use: No  . Drug use: No  . Sexual activity: Not on file  Lifestyle  . Physical activity:    Days per week: Not on file    Minutes per session: Not on file  . Stress: Not on file  Relationships  . Social connections:    Talks on phone: Not on file    Gets together: Not on file    Attends religious service: Not on file    Active member of club or organization: Not on file    Attends meetings of clubs or organizations: Not on file    Relationship status: Not on file  Other Topics Concern  . Not on file  Social History Narrative  . Not on file     Vitals:   08/27/17 1025  BP: 126/78  Pulse: 69  Resp: 16  Temp: 98.1 F (36.7 C)  SpO2: 97%   Body mass index is 22.48 kg/m.   Wt Readings from Last 3 Encounters:  08/27/17 170 lb 6 oz (77.3 kg)  07/07/17 178 lb (80.7 kg)  06/04/17 178 lb (80.7 kg)    Physical Exam  Nursing note and vitals reviewed. Constitutional: He is oriented to person, place, and time. He appears well-developed and well-nourished. No distress.  HENT:  Head: Normocephalic and atraumatic.  Mouth/Throat: Oropharynx is clear and moist and mucous membranes are normal.  Eyes: Pupils are equal, round, and reactive to light. Conjunctivae and EOM are normal.  Neck: Normal range of motion. No tracheal  deviation present. No thyromegaly present.  Cardiovascular: Normal rate and regular rhythm.  No murmur heard. Pulses:      Dorsalis pedis pulses are 2+ on the right side, and 2+ on the left side.  Respiratory: Effort normal and breath sounds normal. No respiratory distress.  GI: Soft. He exhibits no mass. There is no tenderness.  Genitourinary:  Genitourinary Comments: Refused, no changes since last examination (08/2016).  Musculoskeletal: He exhibits no edema or tenderness.  Lymphadenopathy:    He has no cervical adenopathy.  Neurological: He is alert and oriented to person, place, and time. He has normal strength. No cranial nerve deficit or sensory deficit. Coordination and gait normal.  Skin: Skin is warm. No rash noted. No erythema.  Psychiatric: He has a normal mood and affect.  Well groomed,good eye contact.     ASSESSMENT AND PLAN:  Mr. Rahim was seen today for annual exam.  Diagnoses and all orders for this visit:  Lab Results  Component Value Date   CHOL 133 08/27/2017   HDL 57.60 08/27/2017   LDLCALC 55 08/27/2017   TRIG 100.0 08/27/2017   CHOLHDL 2 08/27/2017   Lab Results  Component Value Date   CREATININE 0.99 08/27/2017   BUN 13 08/27/2017   NA 139 08/27/2017   K 4.2 08/27/2017   CL 103 08/27/2017   CO2 29 08/27/2017    Routine general medical examination at a health care facility  We discussed the importance of regular physical activity and healthy diet for prevention of chronic illness and/or complications. Preventive guidelines reviewed. Vaccination updated.  Next CPE in a year.  Prostate cancer screening -     PSA(Must document that pt has been informed of limitations of PSA testing.)  Diabetes mellitus screening -     Basic metabolic panel  Screening for lipid disorders -     Lipid panel  Erectile dysfunction, unspecified erectile dysfunction type  After discussion of some side effects, explained that insurance may not cover  medication. He agrees with trying Sildenafil. F/U as needed.  Need for 23-polyvalent pneumococcal polysaccharide vaccine -     Pneumococcal polysaccharide vaccine 23-valent greater than or equal to 2yo subcutaneous/IM  Other orders -     Zoster Vaccine Adjuvanted [facility]) injection; 0.5 ml in muscle and repeat in 8 weeks -     sildenafil (REVATIO) 20 MG tablet; Take 2 tablets (40 mg total) by mouth daily as needed.    Return in about 1 year (around 08/28/2018) for routine.     Betty G. Martinique, MD  [facility]. [place] office.

## 2017-08-27 ENCOUNTER — Ambulatory Visit (INDEPENDENT_AMBULATORY_CARE_PROVIDER_SITE_OTHER): Payer: Medicare HMO | Admitting: Family Medicine

## 2017-08-27 ENCOUNTER — Encounter: Payer: Self-pay | Admitting: Family Medicine

## 2017-08-27 VITALS — BP 126/78 | HR 69 | Temp 98.1°F | Resp 16 | Ht 73.0 in | Wt 170.4 lb

## 2017-08-27 DIAGNOSIS — Z131 Encounter for screening for diabetes mellitus: Secondary | ICD-10-CM | POA: Diagnosis not present

## 2017-08-27 DIAGNOSIS — Z23 Encounter for immunization: Secondary | ICD-10-CM

## 2017-08-27 DIAGNOSIS — Z125 Encounter for screening for malignant neoplasm of prostate: Secondary | ICD-10-CM | POA: Diagnosis not present

## 2017-08-27 DIAGNOSIS — Z0000 Encounter for general adult medical examination without abnormal findings: Secondary | ICD-10-CM | POA: Diagnosis not present

## 2017-08-27 DIAGNOSIS — Z13220 Encounter for screening for lipoid disorders: Secondary | ICD-10-CM

## 2017-08-27 DIAGNOSIS — N401 Enlarged prostate with lower urinary tract symptoms: Secondary | ICD-10-CM | POA: Insufficient documentation

## 2017-08-27 DIAGNOSIS — N529 Male erectile dysfunction, unspecified: Secondary | ICD-10-CM | POA: Insufficient documentation

## 2017-08-27 DIAGNOSIS — R351 Nocturia: Secondary | ICD-10-CM

## 2017-08-27 LAB — BASIC METABOLIC PANEL
BUN: 13 mg/dL (ref 6–23)
CALCIUM: 9.3 mg/dL (ref 8.4–10.5)
CHLORIDE: 103 meq/L (ref 96–112)
CO2: 29 meq/L (ref 19–32)
Creatinine, Ser: 0.99 mg/dL (ref 0.40–1.50)
GFR: 95.84 mL/min (ref >60.00)
Glucose, Bld: 76 mg/dL (ref 70–99)
Potassium: 4.2 mEq/L (ref 3.5–5.1)
SODIUM: 139 meq/L (ref 135–145)

## 2017-08-27 LAB — LIPID PANEL
Cholesterol: 133 mg/dL (ref 0–200)
HDL: 57.6 mg/dL (ref >39.00)
LDL Cholesterol: 55 mg/dL (ref 0–99)
NONHDL: 74.96
Total CHOL/HDL Ratio: 2
Triglycerides: 100 mg/dL (ref 0.0–149.0)
VLDL: 20 mg/dL (ref 0.0–40.0)

## 2017-08-27 LAB — PSA: PSA: 0.39 ng/mL (ref 0.10–4.00)

## 2017-08-27 MED ORDER — SILDENAFIL CITRATE 20 MG PO TABS: 40.0000 mg | ORAL_TABLET | Freq: Every day | ORAL | 1 refills | Status: DC | PRN

## 2017-08-27 MED ORDER — ZOSTER VAC RECOMB ADJUVANTED 50 MCG/0.5ML IM SUSR: INTRAMUSCULAR | 1 refills | Status: DC

## 2017-08-27 NOTE — Patient Instructions (Addendum)
A few things to remember from today's visit:   Routine general medical examination at a health care facility  Prostate cancer screening - Plan: PSA(Must document that pt has been informed of limitations of PSA testing.)  Diabetes mellitus screening - Plan: Basic metabolic panel  Screening for lipid disorders - Plan: Lipid panel  Erectile dysfunction, unspecified erectile dysfunction type  BPH associated with nocturia   At least 150 minutes of moderate exercise per week, daily brisk walking for 15-30 min is a good exercise option. Healthy diet low in saturated (animal) fats and sweets and consisting of fresh fruits and vegetables, lean meats such as fish and white chicken and whole grains.  - Vaccines:   Shingles vaccine recommended at age 56, could be given after 71 years of age but not sure about insurance coverage.  Pneumonia vaccines:  Prevnar 69 at 45 and Pneumovax at 59.   -Screening recommendations for low/normal risk males:  Screening for diabetes at age 48 and every 3 years. Earlier screening if cardiovascular risk factors.   Lipid screening at 35 and every 3 years. Screening starts in younger males with cardiovascular risk factors.  Colon cancer screening at age 55 and until age 79.  Prostate cancer screening: some controversy, starts usually at 19: Rectal exam and PSA.  Aortic Abdominal Aneurism once between 15 and 64 years old if ever smoker.Ypu have abdominal CT in 2016,negative for aorta aneurysm.   Also recommended:  1. Dental visit- Brush and floss your teeth twice daily; visit your dentist twice a year. 2. Eye doctor- Get an eye exam at least every 2 years. 3. Helmet use- Always wear a helmet when riding a bicycle, motorcycle, rollerblading or skateboarding. 4. Safe sex- If you may be exposed to sexually transmitted infections, use a condom. 5. Seat belts- Seat belts can save your live; always wear one. 6. Smoke/Carbon Monoxide detectors- These  detectors need to be installed on the appropriate level of your home. Replace batteries at least once a year. 7. Skin cancer- When out in the sun please cover up and use sunscreen 15 SPF or higher. 8. Violence- If anyone is threatening or hurting you, please tell your healthcare provider.  9. Drink alcohol in moderation- Limit alcohol intake to one drink or less per day. Never drink and drive.  Please be sure medication list is accurate. If a new problem present, please set up appointment sooner than planned today.

## 2017-08-29 ENCOUNTER — Telehealth: Payer: Self-pay | Admitting: *Deleted

## 2017-08-29 NOTE — Telephone Encounter (Signed)
PA needed for Sildenafil 20 mg tablet, take 2 tablets by mouth once daily as needed, #60

## 2017-09-01 NOTE — Telephone Encounter (Signed)
Prior auth for Revatio 20mg  sent to [email or web address].

## 2017-09-03 DIAGNOSIS — R69 Illness, unspecified: Secondary | ICD-10-CM | POA: Diagnosis not present

## 2017-09-15 NOTE — Telephone Encounter (Signed)
Patient was aware of the possibility of insurance denial when medication was prescribed. No further recommendations in this regard.  Sherril Heyward Martinique, MD

## 2017-09-15 NOTE — Telephone Encounter (Signed)
Message sent to Dr. Jordan for review. 

## 2017-09-15 NOTE — Telephone Encounter (Signed)
Fax received from [employer] stating the request was denied and this was given to Dr Jordan's asst. 

## 2018-01-19 DIAGNOSIS — R69 Illness, unspecified: Secondary | ICD-10-CM | POA: Diagnosis not present

## 2018-01-23 ENCOUNTER — Ambulatory Visit (INDEPENDENT_AMBULATORY_CARE_PROVIDER_SITE_OTHER): Payer: Medicare HMO | Admitting: Family Medicine

## 2018-01-23 ENCOUNTER — Encounter: Payer: Self-pay | Admitting: Family Medicine

## 2018-01-23 VITALS — BP 120/76 | HR 60 | Temp 97.8°F | Resp 16 | Ht 73.0 in | Wt 171.2 lb

## 2018-01-23 DIAGNOSIS — Z1159 Encounter for screening for other viral diseases: Secondary | ICD-10-CM

## 2018-01-23 DIAGNOSIS — Z136 Encounter for screening for cardiovascular disorders: Secondary | ICD-10-CM | POA: Diagnosis not present

## 2018-01-23 DIAGNOSIS — N401 Enlarged prostate with lower urinary tract symptoms: Secondary | ICD-10-CM | POA: Diagnosis not present

## 2018-01-23 DIAGNOSIS — Z0000 Encounter for general adult medical examination without abnormal findings: Secondary | ICD-10-CM

## 2018-01-23 DIAGNOSIS — Z87891 Personal history of nicotine dependence: Secondary | ICD-10-CM

## 2018-01-23 DIAGNOSIS — Z9189 Other specified personal risk factors, not elsewhere classified: Secondary | ICD-10-CM

## 2018-01-23 DIAGNOSIS — R351 Nocturia: Secondary | ICD-10-CM

## 2018-01-23 DIAGNOSIS — T148XXA Other injury of unspecified body region, initial encounter: Secondary | ICD-10-CM

## 2018-01-23 LAB — PSA: PSA: 0.41 ng/mL (ref 0.10–4.00)

## 2018-01-23 MED ORDER — ZOSTER VAC RECOMB ADJUVANTED 50 MCG/0.5ML IM SUSR: INTRAMUSCULAR | 1 refills | Status: DC

## 2018-01-23 MED ORDER — TIZANIDINE HCL 4 MG PO TABS
2.0000 mg | ORAL_TABLET | Freq: Every day | ORAL | 0 refills | Status: AC
Start: 2018-01-23 — End: 2018-02-22

## 2018-01-23 NOTE — Progress Notes (Signed)
ACUTE VISIT   HPI:  Chief Complaint  Patient presents with  . Spasms    pulled muscle in back on Wednesday, left-side middle part    [name] is a 72 y.o. male, who is here today complaining of left-sided back pain.   He denies any trauma but he was trying to pull out push mower that got stacked.  He does not recall pulling a muscle but the pain started a few hours later.   Pain is not radiated, sharp and achy like, 8-9/10 in intensity, with no associated LE numbness, tingling, urinary incontinence or retention, stool incontinence, or saddle anesthesia.  Exacerbated by certain movements. Alleviated by taking ibuprofen. No rash or edema on area, fever, chills, or abnormal wt loss.  Prior Hx of back pain: Negative   OTC medications: He tried ibuprofen twice.  He is also due for Medicare preventive visit, he agrees with having this done today. He does not remember when last preventive visit was, he knows he was over a year ago.  He lives with wife and daughter. Independent ADL's and IADL's. No falls in the past year and denies depression symptoms.  Functional Status Survey: Is the patient deaf or have difficulty hearing?: No Does the patient have difficulty seeing, even when wearing glasses/contacts?: No Does the patient have difficulty concentrating, remembering, or making decisions?: No Does the patient have difficulty walking or climbing stairs?: No Does the patient have difficulty dressing or bathing?: No Does the patient have difficulty doing errands alone such as visiting a doctor's office or shopping?: No  Fall Risk  01/23/2018 08/27/2017 12/22/2015 07/01/2014 02/19/2013  Falls in the past year? No No No No No   No Hx of HTN,HLD,or DM. Former smoker, from [place] to 1975, < 1 PPD.  He lives with his wife and son.  He exercises regularly, 2-3 times per week.He goes to the gym, walks, and plays tennis. Sexually active. Nocturia 1-2 per night,depending of  amount of fluids he drinks around bedtime. This has been stable a couple of years.   Providers he sees regularly: N/A  Eye care provider: He has not established with one yet, last eye exam 2 to 3 years ago.  Depression screen [facility] 2/9 01/23/2018  Decreased Interest 0  Down, Depressed, Hopeless 0  PHQ - 2 Score 0    Mini-Cog - 01/23/18 1351    Normal clock drawing test?  yes    How many words correct?  3         Visual Acuity Screening   Right eye Left eye Both eyes  Without correction: 20/25 20/25 20/20   With correction:      He does not have POA or living will. He has forms at home but has not completed them.   Review of Systems  Constitutional: Negative for activity change, appetite change, fatigue, fever and unexpected weight change.  HENT: Negative for nosebleeds, sore throat and trouble swallowing.   Eyes: Negative for redness and visual disturbance.  Respiratory: Negative for cough, shortness of breath and wheezing.   Cardiovascular: Negative for chest pain, palpitations and leg swelling.  Gastrointestinal: Negative for abdominal pain, nausea and vomiting.  Endocrine: Negative for polydipsia, polyphagia and polyuria.  Genitourinary: Negative for decreased urine volume, dysuria and hematuria.  Musculoskeletal: Positive for back pain. Negative for gait problem.  Skin: Negative for rash and wound.  Neurological: Negative for weakness, numbness and headaches.  Psychiatric/Behavioral: Negative for confusion. The patient is not  nervous/anxious.       Current Outpatient Medications on File Prior to Visit  Medication Sig Dispense Refill  . cholecalciferol (VITAMIN D) 1000 units tablet Take 1 capsule by mouth daily.     No current facility-administered medications on file prior to visit.      Past Medical History:  Diagnosis Date  . Abnormal glucose   . History of small bowel obstruction    x2  . Pneumonia    as a child   No Known Allergies  Social History     Socioeconomic History  . Marital status: Married    Spouse name: Not on file  . Number of children: Not on file  . Years of education: Not on file  . Highest education level: Not on file  Occupational History  . Not on file  Social Needs  . Financial resource strain: Not on file  . Food insecurity:    Worry: Not on file    Inability: Not on file  . Transportation needs:    Medical: Not on file    Non-medical: Not on file  Tobacco Use  . Smoking status: Former Smoker    Packs/day: 4.00    Types: Cigarettes    Start date: 04/15/1966    Last attempt to quit: 04/15/1970    Years since quitting: 47.8  . Smokeless tobacco: Never Used  Substance and Sexual Activity  . Alcohol use: No  . Drug use: No  . Sexual activity: Not on file  Lifestyle  . Physical activity:    Days per week: Not on file    Minutes per session: Not on file  . Stress: Not on file  Relationships  . Social connections:    Talks on phone: Not on file    Gets together: Not on file    Attends religious service: Not on file    Active member of club or organization: Not on file    Attends meetings of clubs or organizations: Not on file    Relationship status: Not on file  Other Topics Concern  . Not on file  Social History Narrative  . Not on file    Vitals:   01/23/18 1101  BP: 120/76  Pulse: 60  Resp: 16  Temp: 97.8 F (36.6 C)  SpO2: 98%   Body mass index is 22.59 kg/m.   Physical Exam  Nursing note and vitals reviewed. Constitutional: He is oriented to person, place, and time. He appears well-developed and well-nourished. No distress.  HENT:  Head: Normocephalic and atraumatic.  Mouth/Throat: Oropharynx is clear and moist and mucous membranes are normal.  Eyes: Pupils are equal, round, and reactive to light. Conjunctivae are normal.  Cardiovascular: Normal rate and regular rhythm.  No murmur heard. Pulses:      Dorsalis pedis pulses are 2+ on the right side, and 2+ on the left side.   Respiratory: Effort normal and breath sounds normal. No respiratory distress.  GI: Soft. He exhibits no mass. There is no hepatomegaly. There is no tenderness.  Genitourinary:  Genitourinary Comments: Refused rectal exam,no concerns.  Musculoskeletal: He exhibits no edema.       Thoracic back: He exhibits no tenderness.       Lumbar back: He exhibits no tenderness.       Back:  He was not able to reproduce pain on affected area (silicone on graphic).  No deformities or crepitus upon palpation.   Lymphadenopathy:    He has no cervical adenopathy.  Neurological:  He is alert and oriented to person, place, and time. He has normal strength. No cranial nerve deficit. Gait normal.  Reflex Scores:      Patellar reflexes are 2+ on the right side and 2+ on the left side. SLR negative bilateral.  Skin: Skin is warm. No rash noted. No erythema.  Psychiatric: He has a normal mood and affect.  Well groomed, good eye contact.      ASSESSMENT AND PLAN:   Mr. Rayshard was seen today for spasms.  Diagnoses and all orders for this visit:  Medicare annual wellness visit, subsequent We discussed the importance of staying active, physically and mentally, as well as the benefits of a healthy/balnace diet. Low impact exercise that involve stretching and strengthing are ideal. Vaccines up to date,Rx for shingrex given. We discussed preventive screening for the next 5-10 years, summery of recommendations given in AVS:   Colonoscopy due in 2023. Periodic eye exam and glaucoma screening. Diabetes and lipid screening every 3 years. AAA screening,abdominal US will be arranged.  Fall prevention.  Advance directives and end of life discussed, encouraged to complete documentation he has at home.    Musculoskeletal strain  I do not think imaging is needed today. Most likely muscle strain. Local icy hot or similar OTC products may help. Side effects of muscle relaxants discussed. F/U as needed.  -      tiZANidine (ZANAFLEX) 4 MG tablet; Take 0.5-1 tablets (2-4 mg total) by mouth at bedtime.  BPH associated with nocturia  Stable symptoms, not interested in pharmacologic treatment.  -     PSA  Encounter for HCV screening test for high risk patient -     Hepatitis C antibody  Hx of smoking -     US ABDOMINAL AORTA SCREENING AAA; Future  Encounter for abdominal aortic aneurysm screening -     US ABDOMINAL AORTA SCREENING AAA; Future  Other orders -     Zoster Vaccine Adjuvanted [facility]) injection; 0.5 ml in muscle and repeat in 8 weeks     Return in about 1 year (around 01/24/2019) for CPE and medicare.     Sheron Robin G. Martinique, MD  [facility]. [place] office.

## 2018-01-23 NOTE — Assessment & Plan Note (Signed)
Symptoms have been stable for about 2 years. For now I am not recommending pharmacologic treatment.

## 2018-01-23 NOTE — Patient Instructions (Signed)
A few things to remember from today's visit:   BPH associated with nocturia - Plan: PSA  Encounter for HCV screening test for high risk patient - Plan: Hepatitis C antibody  Musculoskeletal strain - Plan: tiZANidine (ZANAFLEX) 4 MG tablet   A few tips:  -As we age balance is not as good as it was, so there is a higher risks for falls. Please remove small rugs and furniture that is "in your way" and could increase the risk of falls. Stretching exercises may help with fall prevention: Yoga and Tai Chi are some examples. Low impact exercise is better, so you are not very achy the next day.  -Sun screen and avoidance of direct sun light recommended. Caution with dehydration, if working outdoors be sure to drink enough fluids.  - Some medications are not safe as we age, increases the risk of side effects and can potentially interact with other medication you are also taken;  including some of over the counter medications. Be sure to let me know when you start a new medication even if it is a dietary/vitamin supplement.   -Healthy diet low in red meet/animal fat and sugar + regular physical activity is recommended.       Screening schedule for the next 5-10 years:  Colonoscopy 2023  Glaucoma screening/eye exam every 1-2 years.  Flu vaccine annually.  Diabetes and cholesterol screening q 3 years.  Fall prevention   Abdominal aortic aneurysm screening if you ever smoke.  Appointment for abdominal ultrasound will be arranged.    Advance directives:  Please see a lawyer and/or go to this website to help you with advanced directives and designating a health care power of attorney so that your wishes will be followed should you become too ill to make your own medical decisions.  [email or web address]  Please be sure medication list is accurate. If a new problem present, please set up appointment sooner than planned today.

## 2018-01-24 LAB — HEPATITIS C ANTIBODY
Hepatitis C Ab: NONREACTIVE
SIGNAL TO CUT-OFF: 0.02 (ref <1.00)

## 2018-02-03 ENCOUNTER — Ambulatory Visit
Admission: RE | Admit: 2018-02-03 | Discharge: 2018-02-03 | Disposition: A | Discharge status: Home / Self Care | Payer: Medicare HMO | Source: Ambulatory Visit | Attending: Family Medicine | Admitting: Family Medicine

## 2018-02-03 DIAGNOSIS — Z87891 Personal history of nicotine dependence: Secondary | ICD-10-CM | POA: Diagnosis not present

## 2018-02-03 DIAGNOSIS — Z136 Encounter for screening for cardiovascular disorders: Secondary | ICD-10-CM | POA: Diagnosis not present

## 2018-04-21 ENCOUNTER — Ambulatory Visit (INDEPENDENT_AMBULATORY_CARE_PROVIDER_SITE_OTHER): Payer: Medicare Other | Admitting: Family Medicine

## 2018-04-21 ENCOUNTER — Encounter: Payer: Self-pay | Admitting: Family Medicine

## 2018-04-21 VITALS — BP 120/76 | HR 68 | Temp 98.1°F | Resp 12 | Ht 73.0 in | Wt 172.0 lb

## 2018-04-21 DIAGNOSIS — M79604 Pain in right leg: Secondary | ICD-10-CM

## 2018-04-21 DIAGNOSIS — M545 Low back pain, unspecified: Secondary | ICD-10-CM

## 2018-04-21 MED ORDER — TRAMADOL HCL 50 MG PO TABS: 50.0000 mg | ORAL_TABLET | Freq: Two times a day (BID) | ORAL | 0 refills | Status: AC | PRN

## 2018-04-21 MED ORDER — PREDNISONE 20 MG PO TABS: ORAL_TABLET | ORAL | 0 refills | Status: DC

## 2018-04-21 NOTE — Progress Notes (Signed)
ACUTE VISIT   HPI:  Chief Complaint  Patient presents with  . Right side sciatic pain    radiaties from butt to right leg getting worse over the last fews days mainly at night    Richard Middleton is a 73 y.o. male, who is here today complaining of a couple of days of right lower back pain, he points to the SI joint. Pain is radiated to lateral aspect of right hip and thigh. Occasional numbness sensation. He denies saddle anesthesia or changes in urine/bowel continence He denies any history of direct trauma but thinks he may have injured his back when he was running down the stairs from 3rd level to turn off alarm in the house. He feels like pain is getting worse.  He has had similar problem intermittently for years. In 2017 he did PT, which helped.  He described pain as achy-like pain, excruciating, 9/10. It seems to be worse in the middle of the night, interfering with his sleep, and early in the morning. Pain starts getting better about 2 hours after getting up and he has no problem during the rest of the day,no limitation of daily physical activities.  He has tried local ice,tiger balm, and Advil.  Episodes he has had in the past usually last 2 to 3 weeks. He has not identified exacerbating or alleviating factors.  He has seen chiropractor in the past.  He denies fever, chills, change in appetite, or local skin changes.   Review of Systems  Constitutional: Negative for appetite change, chills, fatigue and fever.  Gastrointestinal: Negative for abdominal pain, nausea and vomiting.  Genitourinary: Negative for decreased urine volume, difficulty urinating, dysuria, hematuria and urgency.  Musculoskeletal: Positive for back pain. Negative for gait problem.  Skin: Negative for color change and rash.  Neurological: Positive for numbness. Negative for syncope, weakness and headaches.  Psychiatric/Behavioral: Positive for sleep disturbance. Negative for confusion.       Current Outpatient Medications on File Prior to Visit  Medication Sig Dispense Refill  . cholecalciferol (VITAMIN D) 1000 units tablet Take 1 capsule by mouth daily.    Marland Kitchen Zoster Vaccine Adjuvanted [facility]) injection 0.5 ml in muscle and repeat in 8 weeks 0.5 mL 1   No current facility-administered medications on file prior to visit.      Past Medical History:  Diagnosis Date  . Abnormal glucose   . History of small bowel obstruction    x2  . Pneumonia    as a child   No Known Allergies  Social History   Socioeconomic History  . Marital status: Married    Spouse name: Not on file  . Number of children: Not on file  . Years of education: Not on file  . Highest education level: Not on file  Occupational History  . Not on file  Social Needs  . Financial resource strain: Not on file  . Food insecurity:    Worry: Not on file    Inability: Not on file  . Transportation needs:    Medical: Not on file    Non-medical: Not on file  Tobacco Use  . Smoking status: Former Smoker    Packs/day: 4.00    Types: Cigarettes    Start date: 04/15/1966    Last attempt to quit: 04/15/1970    Years since quitting: 48.0  . Smokeless tobacco: Never Used  Substance and Sexual Activity  . Alcohol use: No  . Drug use: No  .  Sexual activity: Not on file  Lifestyle  . Physical activity:    Days per week: Not on file    Minutes per session: Not on file  . Stress: Not on file  Relationships  . Social connections:    Talks on phone: Not on file    Gets together: Not on file    Attends religious service: Not on file    Active member of club or organization: Not on file    Attends meetings of clubs or organizations: Not on file    Relationship status: Not on file  Other Topics Concern  . Not on file  Social History Narrative  . Not on file    Vitals:   04/21/18 1504  BP: 120/76  Pulse: 68  Resp: 12  Temp: 98.1 F (36.7 C)  SpO2: 95%   Body mass index is 22.69  kg/m.   Physical Exam  Nursing note and vitals reviewed. Constitutional: He is oriented to person, place, and time. He appears well-developed and well-nourished. No distress.  HENT:  Head: Normocephalic and atraumatic.  Mouth/Throat: Oropharynx is clear and moist and mucous membranes are normal.  Eyes: Conjunctivae are normal.  Cardiovascular: Normal rate and regular rhythm.  Respiratory: Effort normal and breath sounds normal. No respiratory distress.  GI: Soft. He exhibits no mass. There is no abdominal tenderness.  Musculoskeletal:        General: No edema.     Right hip: He exhibits normal range of motion, normal strength and no tenderness.     Lumbar back: He exhibits no tenderness and no bony tenderness.     Comments: No significant deformity appreciated. No tenderness upon palpation of paraspinal muscles. Pain is not elicited with movement on exam table during examination. No local edema or erythema appreciated, no suspicious lesions.  Right hip ROM normal, it elicit right lower back pain but minimal, "0.15/10."   Neurological: He is alert and oriented to person, place, and time. He has normal strength. Coordination normal.  Skin: Skin is warm. No erythema.  Psychiatric: He has a normal mood and affect.  Well groomed,good eye contact.    ASSESSMENT AND PLAN:  Richard Middleton was seen today for right side sciatic pain.  Diagnoses and all orders for this visit:  Right leg pain ?  Radiculopathy. He has taken prednisone in the past and he has tolerated it well. We discussed side effects of prednisone and tramadol. Instructed about warning signs.  -     predniSONE (DELTASONE) 20 MG tablet; 3 tabs for 3 days, 2 tabs for 3 days, 1 tabs for 3 days, and 1/2 tab for 3 days. Take tables together with breakfast. -     traMADol (ULTRAM) 50 MG tablet; Take 1 tablet (50 mg total) by mouth every 12 (twelve) hours as needed for up to 5 days.  Right low back pain, unspecified chronicity,  unspecified whether sciatica present Continue local ice and stretching exercises. If not better in 3 to 4 weeks, will need to consider Ortho referral, PT, or/and lumbar MRI. Recommend taking tramadol at bedtime.  -     traMADol (ULTRAM) 50 MG tablet; Take 1 tablet (50 mg total) by mouth every 12 (twelve) hours as needed for up to 5 days.    Return if symptoms worsen or fail to improve.      Betty G. Martinique, MD  [facility]. [place] office.

## 2018-04-21 NOTE — Patient Instructions (Signed)
A few things to remember from today's visit:   Right leg pain - Plan: predniSONE (DELTASONE) 20 MG tablet, traMADol (ULTRAM) 50 MG tablet  Right low back pain, unspecified chronicity, unspecified whether sciatica present - Plan: traMADol (ULTRAM) 50 MG tablet  Stretching exercises at home. Take tramadol at bedtime. Prednisone in the morning with food. If not better in 2 to 3 weeks or if problem gets worse, we will need to consider referral to orthopedist, physical therapist, or back imaging.  Please be sure medication list is accurate. If a new problem present, please set up appointment sooner than planned today.

## 2018-04-27 ENCOUNTER — Encounter: Payer: Self-pay | Admitting: Family Medicine

## 2018-04-27 ENCOUNTER — Ambulatory Visit (INDEPENDENT_AMBULATORY_CARE_PROVIDER_SITE_OTHER): Payer: Medicare Other | Admitting: Family Medicine

## 2018-04-27 ENCOUNTER — Ambulatory Visit (HOSPITAL_COMMUNITY)
Admission: RE | Admit: 2018-04-27 | Discharge: 2018-04-27 | Disposition: A | Discharge status: Home / Self Care | Payer: Medicare Other | Source: Ambulatory Visit | Attending: Family Medicine | Admitting: Family Medicine

## 2018-04-27 ENCOUNTER — Telehealth: Payer: Self-pay | Admitting: Family Medicine

## 2018-04-27 VITALS — BP 120/70 | HR 62 | Temp 97.9°F | Resp 12 | Ht 73.0 in

## 2018-04-27 DIAGNOSIS — M79661 Pain in right lower leg: Secondary | ICD-10-CM | POA: Diagnosis not present

## 2018-04-27 DIAGNOSIS — M5410 Radiculopathy, site unspecified: Secondary | ICD-10-CM | POA: Diagnosis not present

## 2018-04-27 DIAGNOSIS — M545 Low back pain, unspecified: Secondary | ICD-10-CM

## 2018-04-27 MED ORDER — IBUPROFEN 600 MG PO TABS: 600.0000 mg | ORAL_TABLET | Freq: Three times a day (TID) | ORAL | 0 refills | Status: AC | PRN

## 2018-04-27 NOTE — Progress Notes (Signed)
HPI:  Chief Complaint  Patient presents with  . Back Pain    patient complains of worsening of symptoms from the visit last week, states pain medication (Tramadol) only helps some    Richard Middleton is a 73 y.o. male, who is here today with his son to follow on recent OV. He was seen on 04/21/2016. Tramadol were recommended, which is not helping much. Ibuprofen helps "a little" as well as local ice.  Last visit I recommended course of prednisone to treat right lower extremity pain, possible radicular pain. He did not take prednisone as instructed instead he took 3 tab x 1 2 tabs x 1 and 1 tab x 1.  He felt like prednisone was helping with the pain, he tolerated medication well.  Today he noted tightness of right calf as well as achy pain and numbness. He also thinks he has some right lower extremity edema and limitation of ankle movement. He has had right Achilles tendon surgery and has residual limitation of flexion. He denies chest pain, dyspnea, palpitations, or diaphoresis.  He has not noted erythema or rash on affected area. He still has right lower back pain, intermittent shooting pain radiated to right lower extremity down to foot. Now pain is 5/10 but at night is 10/10. Pain is interfering with his sleep. He has difficulty getting up from bed at night when he needs to use the bathroom, he needs assistance.  He denies saddle anesthesia or changes in bowel/urine continence.  Pain is getting worse, now he is using a cane.    Review of Systems  Constitutional: Positive for activity change and fatigue. Negative for appetite change, chills and fever.  Respiratory: Negative for cough, shortness of breath and wheezing.   Cardiovascular: Negative for chest pain and palpitations.  Gastrointestinal: Negative for abdominal pain, nausea and vomiting.  Genitourinary: Negative for decreased urine volume, dysuria, hematuria and urgency.  Musculoskeletal: Positive for back pain  and gait problem.  Skin: Negative for color change and rash.  Neurological: Positive for numbness. Negative for syncope, weakness and headaches.  Psychiatric/Behavioral: Positive for sleep disturbance. Negative for confusion.      Current Outpatient Medications on File Prior to Visit  Medication Sig Dispense Refill  . cholecalciferol (VITAMIN D) 1000 units tablet Take 1 capsule by mouth daily.    . predniSONE (DELTASONE) 20 MG tablet 3 tabs for 3 days, 2 tabs for 3 days, 1 tabs for 3 days, and 1/2 tab for 3 days. Take tables together with breakfast. 20 tablet 0  . Zoster Vaccine Adjuvanted [facility]) injection 0.5 ml in muscle and repeat in 8 weeks 0.5 mL 1   No current facility-administered medications on file prior to visit.      Past Medical History:  Diagnosis Date  . Abnormal glucose   . History of small bowel obstruction    x2  . Pneumonia    as a child   No Known Allergies  Social History   Socioeconomic History  . Marital status: Married    Spouse name: Not on file  . Number of children: Not on file  . Years of education: Not on file  . Highest education level: Not on file  Occupational History  . Not on file  Social Needs  . Financial resource strain: Not on file  . Food insecurity:    Worry: Not on file    Inability: Not on file  . Transportation needs:    Medical: Not  on file    Non-medical: Not on file  Tobacco Use  . Smoking status: Former Smoker    Packs/day: 4.00    Types: Cigarettes    Start date: 04/15/1966    Last attempt to quit: 04/15/1970    Years since quitting: 48.0  . Smokeless tobacco: Never Used  Substance and Sexual Activity  . Alcohol use: No  . Drug use: No  . Sexual activity: Not on file  Lifestyle  . Physical activity:    Days per week: Not on file    Minutes per session: Not on file  . Stress: Not on file  Relationships  . Social connections:    Talks on phone: Not on file    Gets together: Not on file    Attends religious  service: Not on file    Active member of club or organization: Not on file    Attends meetings of clubs or organizations: Not on file    Relationship status: Not on file  Other Topics Concern  . Not on file  Social History Narrative  . Not on file    Vitals:   04/27/18 1010  BP: 120/70  Pulse: 62  Resp: 12  Temp: 97.9 F (36.6 C)   Body mass index is 22.69 kg/m.    Physical Exam  Nursing note and vitals reviewed. Constitutional: He is oriented to person, place, and time. He appears well-developed and well-nourished. No distress.  HENT:  Head: Normocephalic and atraumatic.  Eyes: Conjunctivae and EOM are normal.  Cardiovascular: Normal rate and regular rhythm.  No murmur heard. Pulses:      Dorsalis pedis pulses are 2+ on the right side.       Posterior tibial pulses are 2+ on the right side.  Homan's sign negative RLE. Varicose veins appreciated.  Respiratory: Effort normal and breath sounds normal. No respiratory distress.  GI: Soft. He exhibits no mass. There is no hepatomegaly. There is no abdominal tenderness.  Musculoskeletal:        General: No edema.     Lumbar back: He exhibits tenderness. He exhibits no bony tenderness.       Back:     Comments: No significant deformity appreciated. There is tenderness upon palpation of right lower paraspinal muscles. Pain elicited with movement on exam table during examination. No local edema or erythema appreciated, no suspicious lesions.  Right hip ROM normal and it does not cause pain.  Right calf tenderness with palpation, mild. When compared with left calf, right calf is tighter. Some distal right lower extremity muscle atrophy dysuria from prior surgeries.   Neurological: He is alert and oriented to person, place, and time. He has normal strength. Gait (Antalgic gait assisted with a cane.) abnormal.  Reflex Scores:      Patellar reflexes are 2+ on the right side and 2+ on the left side.      Achilles reflexes  are 2+ on the right side and 2+ on the left side. SLR negative.  Skin: Skin is warm. No rash noted. No erythema.  Psychiatric: His mood appears anxious.  Well groomed,good eye contact.    ASSESSMENT AND PLAN:  [name] was seen today for back pain.   Orders Placed This Encounter  Procedures  . MR Lumbar Spine Wo Contrast  . Ambulatory referral to Orthopedic Surgery   Right calf pain We discussed possible etiologies, most likely related to radicular pain but because new onset of "tightness" + pain I think is appropriate  to evaluate for DVT. Start vitals [place] of right lower extremity was arranged.  -     VAS [place] LOWER EXTREMITY VENOUS (DVT); Future -     ibuprofen (ADVIL,MOTRIN) 600 MG tablet; Take 1 tablet (600 mg total) by mouth every 8 (eight) hours as needed for up to 10 days.  Acute right-sided low back pain, unspecified whether sciatica present Tramadol is not helping, so discontinued. Ibuprofen 600 mg 3 times daily with food recommended, we discussed side effects of NSAIDs. Appointment with orthopedist will be arranged.  -     MR Lumbar Spine Wo Contrast; Future -     Ambulatory referral to Orthopedic Surgery  Radicular pain of right lower extremity He will resume prednisone taper, we discussed some side effects. He understand the risk of GI symptoms with NSAIDs and prednisone not combine, recommend taking medication with food. Lumbar MRI will be arranged. Clearly instructed about warning signs.  -     MR Lumbar Spine Wo Contrast; Future -     Ambulatory referral to Orthopedic Surgery       Betty G. Martinique, MD  [facility]. [place] office.

## 2018-04-27 NOTE — Telephone Encounter (Signed)
Richard Middleton, Registered Vascular Technician at [facility] called to report that the pt's right lower extremeity venous studies are negative for DVT; this a preliminary report; results repeated for correctness; the pt is seen by Dr Betty Martinique, [facility]; notified Miquel Dunn; will route to office for notificaton.

## 2018-04-27 NOTE — Telephone Encounter (Signed)
Duplicate message. 

## 2018-04-27 NOTE — Progress Notes (Signed)
Right lower extremity venous duplex has been completed. Negative for DVT. Results were given to [facility] at Dr. Doug Sou office.  04/27/18 2:26 PM Richard Middleton RVT

## 2018-04-27 NOTE — Telephone Encounter (Signed)
Received a call with Prelim results from Venous Doppler  Summary: Right: There is no evidence of deep vein thrombosis in the lower extremity. No cystic structure found in the popliteal fossa. Left: No evidence of common femoral vein obstruction.  Will send to Dr Martinique to make aware of results.

## 2018-04-27 NOTE — Patient Instructions (Addendum)
A few things to remember from today's visit:   Right calf pain - Plan: VAS [place] LOWER EXTREMITY VENOUS (DVT), ibuprofen (ADVIL,MOTRIN) 600 MG tablet  Acute right-sided low back pain, unspecified whether sciatica present - Plan: MR Lumbar Spine Wo Contrast, Ambulatory referral to Orthopedic Surgery  Radicular pain of right lower extremity - Plan: MR Lumbar Spine Wo Contrast, Ambulatory referral to Orthopedic Surgery  Resume prednisone as prescribed. Ibuprofen 600 mg with food 3 times per day.

## 2018-04-28 ENCOUNTER — Telehealth: Payer: Self-pay | Admitting: Family Medicine

## 2018-04-28 MED ORDER — PREDNISONE 20 MG PO TABS: ORAL_TABLET | ORAL | 0 refills | Status: AC

## 2018-04-28 NOTE — Telephone Encounter (Signed)
Copied from [place] [id number]. Topic: Quick Communication - Rx Refill/Question >> Apr 28, 2018  7:18 AM Scherrie Gerlach wrote: Medication: predniSONE (DELTASONE) 20 MG tablet Pt saw Dr Martinique for sciatica pain yesterday and was told to restart this med, because he had not been taking correctly. But it was not at the pharmacy. Pt hoping this can be called in. [place], [place] - [address] [phone] (Phone) [phone] (Fax)

## 2018-04-28 NOTE — Telephone Encounter (Signed)
Rx sent pharmacy  

## 2018-05-03 ENCOUNTER — Ambulatory Visit
Admission: RE | Admit: 2018-05-03 | Discharge: 2018-05-03 | Disposition: A | Discharge status: Home / Self Care | Payer: Medicare Other | Source: Ambulatory Visit | Attending: Family Medicine | Admitting: Family Medicine

## 2018-05-03 DIAGNOSIS — M545 Low back pain, unspecified: Secondary | ICD-10-CM

## 2018-05-03 DIAGNOSIS — M48061 Spinal stenosis, lumbar region without neurogenic claudication: Secondary | ICD-10-CM | POA: Diagnosis not present

## 2018-05-03 DIAGNOSIS — M5410 Radiculopathy, site unspecified: Secondary | ICD-10-CM

## 2018-05-05 ENCOUNTER — Ambulatory Visit (INDEPENDENT_AMBULATORY_CARE_PROVIDER_SITE_OTHER): Payer: Medicare Other | Admitting: Orthopaedic Surgery

## 2018-05-05 ENCOUNTER — Encounter (INDEPENDENT_AMBULATORY_CARE_PROVIDER_SITE_OTHER): Payer: Self-pay | Admitting: Orthopaedic Surgery

## 2018-05-05 ENCOUNTER — Other Ambulatory Visit: Payer: Medicare Other

## 2018-05-05 VITALS — BP 149/86 | HR 91 | Ht 73.0 in | Wt 172.0 lb

## 2018-05-05 DIAGNOSIS — M48062 Spinal stenosis, lumbar region with neurogenic claudication: Secondary | ICD-10-CM | POA: Diagnosis not present

## 2018-05-05 DIAGNOSIS — M21371 Foot drop, right foot: Secondary | ICD-10-CM | POA: Insufficient documentation

## 2018-05-05 NOTE — Progress Notes (Signed)
Office Visit Note   Patient: Richard Middleton           Date of Birth: August 03, 1945           MRN: [id number] Visit Date: 05/05/2018              Requested by: Martinique, Betty G, MD [address] [place], [place] [address] PCP: Martinique, Betty G, MD   Assessment & Plan: Visit Diagnoses:  1. Spinal stenosis of lumbar region with neurogenic claudication   2. Foot drop, right     Plan: Patient has acute onset of foot drop severe back pain at times 10 out of 10 difficulty ambulating and has severe spinal stenosis L4-5 with right side disc protrusion consistent with L5 nerve root compression.  Spinal stenosis severe.  He has no instability and minimal changes at other levels.  We discussed options he like to proceed with single level decompression surgery for spinal stenosis with radiculopathy and neurogenic claudication symptoms.  He was initially better for short period time with a prednisone but has a dose decreased he starting a recurrence of severe symptoms.  MRI scan was reviewed with patient and his wife pathophysiology discussed questions were elicited and answered.  Plan would be single level decompression without fusion, overnight stay.  Risks of dural tear progressive instability of the need for possible fusion, adjacent level problems at some point discussed.  Questions were elicited and answered.  Follow-Up Instructions: No follow-ups on file.   Orders:  No orders of the defined types were placed in this encounter.  No orders of the defined types were placed in this encounter.     Procedures: No procedures performed   Clinical Data: No additional findings.   Subjective: Chief Complaint  Patient presents with  . Lower Back - Pain    HPI 73 year old male has onset 10/2 weeks ago of significant back pain severe pain trying to stand weakness in his right foot with right foot drop and numbness.  He states at times pain is 10 out of 10 he is able to ambulate only 1 to 200  feet.  2 and half weeks ago he got out of bed with alarm in the house when off ran down 3 flights of stairs in his bare feet and started noticing increase in back pain.  Previous history of disc rupture treated by chiropractor with resolution of symptoms.  Achilles tendon repair last year.  Review of Systems previous abdominal laparotomy for lysis of adhesions for bowel obstruction.  Previous right Achilles reconstruction January 2019 Dr. Sharol Given.  Negative cardiovascular respiratory otherwise 14 point systems negative is contributes to HPI.   Objective: Vital Signs: BP (!) 149/86   Pulse 91   Ht 6\' 1"  (1.854 m)   Wt 172 lb (78 kg)   BMI 22.69 kg/m   Physical Exam Constitutional:      Appearance: He is well-developed.  HENT:     Head: Normocephalic and atraumatic.  Eyes:     Pupils: Pupils are equal, round, and reactive to light.  Neck:     Thyroid: No thyromegaly.     Trachea: No tracheal deviation.  Cardiovascular:     Rate and Rhythm: Normal rate.  Pulmonary:     Effort: Pulmonary effort is normal.     Breath sounds: No wheezing.  Abdominal:     General: Bowel sounds are normal.     Palpations: Abdomen is soft.  Skin:    General: Skin is warm  and dry.     Capillary Refill: Capillary refill takes less than 2 seconds.  Neurological:     Mental Status: He is alert and oriented to person, place, and time.  Psychiatric:        Behavior: Behavior normal.        Thought Content: Thought content normal.        Judgment: Judgment normal.     Ortho Exam patient uses arm to get from sitting to standing.  Ambulates with a right slap foot gait.  Anterior tib is 4- out of 5 EHL 3+ 4- out of 5.  Opposite left side shows normal strength.  He has mild right Achilles atrophy with healed incision from Achilles tendon reconstruction.  Sciatic notch tenderness pain with straight leg raising at 80 degrees.  Lateral calf and dorsal foot decreased sensation.  Specialty Comments:  No specialty  comments available.  Imaging CLINICAL DATA:  Right low back pain going into the right leg.  EXAM: MRI LUMBAR SPINE WITHOUT CONTRAST  TECHNIQUE: Multiplanar, multisequence MR imaging of the lumbar spine was performed. No intravenous contrast was administered.  COMPARISON:  None.  FINDINGS: Segmentation:  Standard.  Alignment:  Physiologic.  Vertebrae:  No fracture, evidence of discitis, or bone lesion.  Conus medullaris and cauda equina: Conus extends to the L1 level. Conus and cauda equina appear normal.  Paraspinal and other soft tissues: No acute paraspinal abnormality.  Disc levels:  Disc spaces: Degenerative disc disease with disc height loss at L5-S1 and to lesser extent L4-5. Disc desiccation throughout the lumbar spine.  T12-L1: No significant disc bulge. No evidence of neural foraminal stenosis. No central canal stenosis.  L1-L2: Mild broad-based disc bulge. Left lateral recess stenosis. No foraminal stenosis. No central canal stenosis.  L2-L3: Broad-based disc bulge flattening the ventral thecal sac. Mild bilateral facet arthropathy. Mild spinal stenosis. No evidence of neural foraminal stenosis.  L3-L4: Mild broad-based disc bulge. Mild bilateral facet arthropathy. Bilateral lateral recess narrowing. Mild spinal stenosis. Mild bilateral foraminal stenosis.  L4-L5: Broad-based disc bulge flattening the ventral thecal sac. Moderate bilateral facet arthropathy with severe ligamentum flavum infolding. Severe spinal stenosis. Moderate right and mild left foraminal stenosis.  L5-S1: Broad-based disc bulge flattening the ventral thecal sac. Mild bilateral facet arthropathy. Severe right and moderate left foraminal stenosis. Bilateral lateral recess narrowing.  IMPRESSION: 1. Diffuse lumbar spine spondylosis as described above. 2. At L4-5 there is a broad-based disc bulge flattening the ventral thecal sac. Moderate bilateral facet  arthropathy with severe ligamentum flavum infolding. Severe spinal stenosis. Moderate right and mild left foraminal stenosis.   Electronically Signed   By: Kathreen Devoid   On: 05/03/2018 15:31   PMFS History: Patient Active Problem List   Diagnosis Date Noted  . Spinal stenosis of lumbar region with neurogenic claudication 05/05/2018  . Foot drop, right 05/05/2018  . ED (erectile dysfunction) 08/27/2017  . BPH associated with nocturia 08/27/2017  . Achilles tendon tear, right, subsequent encounter 05/14/2017  . Seborrheic keratoses 08/21/2016  . Small bowel obstruction ([place]) 07/11/2014  . Tendinitis of forearm 04/19/2013  . LIPOMA 06/29/2010  . TINEA BARBAE 01/22/2010  . SMALL BOWEL OBSTRUCTION, HX OF 02/19/2007   Past Medical History:  Diagnosis Date  . Abnormal glucose   . History of small bowel obstruction    x2  . Pneumonia    as a child    Family History  Problem Relation Age of Onset  . Arthritis Mother   . Pulmonary embolism  Father   . Cancer Neg Hx   . Diabetes Neg Hx   . Colon cancer Neg Hx     Past Surgical History:  Procedure Laterality Date  . ACHILLES TENDON SURGERY Right 05/02/2017   Procedure: RIGHT ACHILLES RECONSTRUCTION;  Surgeon: Newt Minion, MD;  Location: [facility];  Service: Orthopedics;  Laterality: Right;  . COLONOSCOPY  2008  . LAPAROTOMY N/A 07/13/2014   Procedure: EXPLORATORY LAPAROTOMY lysis of adhesions;  Surgeon: Leighton Ruff, MD;  Location: [facility];  Service: General;  Laterality: N/A;  . RESECTION SMALL BOWEL / CLOSURE ILEOSTOMY  3 1/2 years ago, & 10 years ago   x2   Social History   Occupational History  . Not on file  Tobacco Use  . Smoking status: Former Smoker    Packs/day: 4.00    Types: Cigarettes    Start date: 04/15/1966    Last attempt to quit: 04/15/1970    Years since quitting: 48.0  . Smokeless tobacco: Never Used  Substance and Sexual Activity  . Alcohol use: No  . Drug use: No  . Sexual activity: Not on file

## 2018-05-11 ENCOUNTER — Telehealth: Payer: Self-pay | Admitting: *Deleted

## 2018-05-11 NOTE — Telephone Encounter (Signed)
Copied from [place] [phone]. Topic: General - Other >> May 11, 2018 10:02 AM Ivar Drape wrote: Reason for CRM:   After receiving his MRI, the patient is waiting for surgery at [facility] at the earliest date, but they said it will probably be in the middle of February.  He wants to know if there is another alternative to get it done sooner or another facility because the pain in his leg is unbearable.  Please advise.

## 2018-05-11 NOTE — Telephone Encounter (Signed)
I recommend voicing this concern to his orthopedist and ask for options, they are more familiar with OR availability than I am. He could also be re-evaluated by a different provider but as far as I know they all use same OR facilities unless provider is with [facility] or [facility].  Thanks, BJ

## 2018-05-11 NOTE — Telephone Encounter (Signed)
Left message for patient to return call to clinic. If patient calls back, [place] may read recommendations per Dr. Doug Sou note.

## 2018-05-11 NOTE — Telephone Encounter (Signed)
Message sent to Dr. Jordan for review. Please advise 

## 2018-05-12 ENCOUNTER — Ambulatory Visit (INDEPENDENT_AMBULATORY_CARE_PROVIDER_SITE_OTHER): Payer: Medicare HMO | Admitting: Orthopaedic Surgery

## 2018-05-12 NOTE — Telephone Encounter (Signed)
Note. No further assistance needed at this time.

## 2018-05-12 NOTE — Telephone Encounter (Signed)
Pt. Given Dr. Doug Sou message - verbalizes understanding.

## 2018-05-14 ENCOUNTER — Encounter (INDEPENDENT_AMBULATORY_CARE_PROVIDER_SITE_OTHER): Payer: Self-pay | Admitting: Surgery

## 2018-05-14 ENCOUNTER — Ambulatory Visit (INDEPENDENT_AMBULATORY_CARE_PROVIDER_SITE_OTHER): Payer: Medicare Other | Admitting: Surgery

## 2018-05-14 VITALS — BP 141/79 | HR 74

## 2018-05-14 DIAGNOSIS — M48062 Spinal stenosis, lumbar region with neurogenic claudication: Secondary | ICD-10-CM

## 2018-05-14 NOTE — Progress Notes (Signed)
73 year old black male with a history of severe L4-5 stenosis, low back pain and right lower extremity radiculopathy with right foot drop comes in for preop evaluation.  States that symptoms unchanged from his last office visit.  We have received preop medical clearance.  Surgical procedure discussed with patient in great detail along with potential rehab/recovery time.  All questions answered.  I did advise him that we are hoping that he gets full return of his anterior tib strength but he does understand that this may or may not happen.

## 2018-05-15 ENCOUNTER — Other Ambulatory Visit: Payer: Self-pay

## 2018-05-15 ENCOUNTER — Encounter (HOSPITAL_COMMUNITY): Payer: Self-pay | Admitting: *Deleted

## 2018-05-15 NOTE — Progress Notes (Signed)
Spoke with pt for pre-op call. Pt denies cardiac history, HTN or Diabetes. Pt's PCP is Dr. Betty Martinique and medical clearance is in pt's chart.

## 2018-05-18 ENCOUNTER — Observation Stay (HOSPITAL_COMMUNITY)
Admission: RE | Admit: 2018-05-18 | Discharge: 2018-05-19 | Disposition: A | Discharge status: Home / Self Care | Payer: Medicare Other | Attending: Orthopaedic Surgery | Admitting: Orthopaedic Surgery

## 2018-05-18 ENCOUNTER — Ambulatory Visit (HOSPITAL_COMMUNITY): Payer: Medicare Other | Admitting: Anesthesiology

## 2018-05-18 ENCOUNTER — Encounter (HOSPITAL_COMMUNITY): Payer: Self-pay | Admitting: *Deleted

## 2018-05-18 ENCOUNTER — Ambulatory Visit (HOSPITAL_COMMUNITY): Payer: Medicare Other

## 2018-05-18 ENCOUNTER — Encounter (HOSPITAL_COMMUNITY)
Admission: RE | Disposition: A | Discharge status: Home / Self Care | Payer: Self-pay | Source: Home / Self Care | Attending: Orthopaedic Surgery

## 2018-05-18 DIAGNOSIS — Z981 Arthrodesis status: Secondary | ICD-10-CM | POA: Diagnosis not present

## 2018-05-18 DIAGNOSIS — M48062 Spinal stenosis, lumbar region with neurogenic claudication: Secondary | ICD-10-CM

## 2018-05-18 DIAGNOSIS — Z419 Encounter for procedure for purposes other than remedying health state, unspecified: Secondary | ICD-10-CM

## 2018-05-18 DIAGNOSIS — Z87891 Personal history of nicotine dependence: Secondary | ICD-10-CM | POA: Diagnosis not present

## 2018-05-18 DIAGNOSIS — M5126 Other intervertebral disc displacement, lumbar region: Secondary | ICD-10-CM | POA: Insufficient documentation

## 2018-05-18 DIAGNOSIS — M545 Low back pain: Secondary | ICD-10-CM | POA: Diagnosis present

## 2018-05-18 DIAGNOSIS — M48061 Spinal stenosis, lumbar region without neurogenic claudication: Principal | ICD-10-CM

## 2018-05-18 DIAGNOSIS — M21371 Foot drop, right foot: Secondary | ICD-10-CM | POA: Diagnosis not present

## 2018-05-18 HISTORY — PX: LUMBAR LAMINECTOMY/DECOMPRESSION MICRODISCECTOMY: SHX5026

## 2018-05-18 LAB — COMPREHENSIVE METABOLIC PANEL
ALT: 17 U/L (ref 0–44)
ANION GAP: 11 (ref 5–15)
AST: 19 U/L (ref 15–41)
Albumin: 3.9 g/dL (ref 3.5–5.0)
Alkaline Phosphatase: 76 U/L (ref 38–126)
BUN: 12 mg/dL (ref 8–23)
CALCIUM: 9.5 mg/dL (ref 8.9–10.3)
CO2: 27 mmol/L (ref 22–32)
Chloride: 101 mmol/L (ref 98–111)
Creatinine, Ser: 1.05 mg/dL (ref 0.61–1.24)
GFR calc non Af Amer: >60 mL/min (ref >60)
Glucose, Bld: 97 mg/dL (ref 70–99)
POTASSIUM: 3.5 mmol/L (ref 3.5–5.1)
Sodium: 139 mmol/L (ref 135–145)
Total Bilirubin: 0.7 mg/dL (ref 0.3–1.2)
Total Protein: 7.4 g/dL (ref 6.5–8.1)

## 2018-05-18 LAB — CBC
HCT: 46.8 % (ref 39.0–52.0)
Hemoglobin: 14.2 g/dL (ref 13.0–17.0)
MCH: 26.6 pg (ref 26.0–34.0)
MCHC: 30.3 g/dL (ref 30.0–36.0)
MCV: 87.8 fL (ref 80.0–100.0)
Platelets: 194 10*3/uL (ref 150–400)
RBC: 5.33 MIL/uL (ref 4.22–5.81)
RDW: 12.8 % (ref 11.5–15.5)
WBC: 6 10*3/uL (ref 4.0–10.5)
nRBC: 0 % (ref 0.0–0.2)

## 2018-05-18 LAB — URINALYSIS, ROUTINE W REFLEX MICROSCOPIC
Bilirubin Urine: NEGATIVE
Glucose, UA: NEGATIVE mg/dL
Hgb urine dipstick: NEGATIVE
Ketones, ur: NEGATIVE mg/dL
LEUKOCYTES UA: NEGATIVE
Nitrite: NEGATIVE
PH: 7 (ref 5.0–8.0)
Protein, ur: NEGATIVE mg/dL
Specific Gravity, Urine: 1.017 (ref 1.005–1.030)

## 2018-05-18 LAB — SURGICAL PCR SCREEN
MRSA, PCR: NEGATIVE
Staphylococcus aureus: NEGATIVE

## 2018-05-18 SURGERY — LUMBAR LAMINECTOMY/DECOMPRESSION MICRODISCECTOMY
Anesthesia: General | Site: Back

## 2018-05-18 MED ORDER — ONDANSETRON HCL 4 MG PO TABS: 4.0000 mg | ORAL_TABLET | Freq: Four times a day (QID) | ORAL | Status: DC | PRN

## 2018-05-18 MED ORDER — PHENYLEPHRINE 40 MCG/ML (10ML) SYRINGE FOR IV PUSH (FOR BLOOD PRESSURE SUPPORT)
PREFILLED_SYRINGE | INTRAVENOUS | Status: DC | PRN
  Administered 2018-05-18 (×2): 120 ug via INTRAVENOUS
  Administered 2018-05-18 (×2): 80 ug via INTRAVENOUS

## 2018-05-18 MED ORDER — ONDANSETRON HCL 4 MG/2ML IJ SOLN
INTRAMUSCULAR | Status: DC | PRN
  Administered 2018-05-18 (×2): 4 mg via INTRAVENOUS

## 2018-05-18 MED ORDER — ACETAMINOPHEN 10 MG/ML IV SOLN: 1000.0000 mg | Freq: Once | INTRAVENOUS | Status: DC | PRN

## 2018-05-18 MED ORDER — PROPOFOL 10 MG/ML IV BOLUS
INTRAVENOUS | Status: DC | PRN
  Administered 2018-05-18: 130 mg via INTRAVENOUS

## 2018-05-18 MED ORDER — CEFAZOLIN SODIUM-DEXTROSE 2-4 GM/100ML-% IV SOLN
2.0000 g | INTRAVENOUS | Status: AC
  Administered 2018-05-18: 2 g via INTRAVENOUS

## 2018-05-18 MED ORDER — ACETAMINOPHEN 325 MG PO TABS: 650.0000 mg | ORAL_TABLET | ORAL | Status: DC | PRN

## 2018-05-18 MED ORDER — OXYCODONE HCL 5 MG PO TABS
ORAL_TABLET | ORAL | Status: AC
  Filled 2018-05-18: qty 1

## 2018-05-18 MED ORDER — SODIUM CHLORIDE 0.9 % IV SOLN: INTRAVENOUS | Status: DC

## 2018-05-18 MED ORDER — PROPOFOL 10 MG/ML IV BOLUS
INTRAVENOUS | Status: AC
  Filled 2018-05-18: qty 20

## 2018-05-18 MED ORDER — SODIUM CHLORIDE 0.9% FLUSH: 3.0000 mL | Freq: Two times a day (BID) | INTRAVENOUS | Status: DC

## 2018-05-18 MED ORDER — DEXAMETHASONE SODIUM PHOSPHATE 10 MG/ML IJ SOLN
INTRAMUSCULAR | Status: DC | PRN
  Administered 2018-05-18: 10 mg via INTRAVENOUS

## 2018-05-18 MED ORDER — FENTANYL CITRATE (PF) 250 MCG/5ML IJ SOLN
INTRAMUSCULAR | Status: AC
  Filled 2018-05-18: qty 5

## 2018-05-18 MED ORDER — CEFAZOLIN SODIUM-DEXTROSE 1-4 GM/50ML-% IV SOLN
1.0000 g | Freq: Three times a day (TID) | INTRAVENOUS | Status: AC
  Administered 2018-05-18 – 2018-05-19 (×2): 1 g via INTRAVENOUS
  Filled 2018-05-18 (×2): qty 50

## 2018-05-18 MED ORDER — MENTHOL 3 MG MT LOZG
1.0000 | LOZENGE | OROMUCOSAL | Status: DC | PRN
  Filled 2018-05-18: qty 9

## 2018-05-18 MED ORDER — HYDROMORPHONE HCL 1 MG/ML IJ SOLN: 0.5000 mg | INTRAMUSCULAR | Status: DC | PRN

## 2018-05-18 MED ORDER — ONDANSETRON HCL 4 MG/2ML IJ SOLN
INTRAMUSCULAR | Status: AC
  Filled 2018-05-18: qty 2

## 2018-05-18 MED ORDER — BUPIVACAINE HCL (PF) 0.25 % IJ SOLN
INTRAMUSCULAR | Status: DC | PRN
  Administered 2018-05-18: 5 mL

## 2018-05-18 MED ORDER — ROCURONIUM BROMIDE 50 MG/5ML IV SOSY
PREFILLED_SYRINGE | INTRAVENOUS | Status: DC | PRN
  Administered 2018-05-18: 10 mg via INTRAVENOUS
  Administered 2018-05-18: 50 mg via INTRAVENOUS

## 2018-05-18 MED ORDER — FENTANYL CITRATE (PF) 100 MCG/2ML IJ SOLN
INTRAMUSCULAR | Status: AC
  Filled 2018-05-18: qty 2

## 2018-05-18 MED ORDER — 0.9 % SODIUM CHLORIDE (POUR BTL) OPTIME
TOPICAL | Status: DC | PRN
  Administered 2018-05-18: 1000 mL

## 2018-05-18 MED ORDER — ONDANSETRON HCL 4 MG/2ML IJ SOLN: 4.0000 mg | Freq: Four times a day (QID) | INTRAMUSCULAR | Status: DC | PRN

## 2018-05-18 MED ORDER — ACETAMINOPHEN 650 MG RE SUPP: 650.0000 mg | RECTAL | Status: DC | PRN

## 2018-05-18 MED ORDER — SODIUM CHLORIDE 0.9% FLUSH: 3.0000 mL | INTRAVENOUS | Status: DC | PRN

## 2018-05-18 MED ORDER — LIDOCAINE 2% (20 MG/ML) 5 ML SYRINGE
INTRAMUSCULAR | Status: DC | PRN
  Administered 2018-05-18: 60 mg via INTRAVENOUS

## 2018-05-18 MED ORDER — LACTATED RINGERS IV SOLN
INTRAVENOUS | Status: DC
  Administered 2018-05-18 (×2): via INTRAVENOUS

## 2018-05-18 MED ORDER — HEMOSTATIC AGENTS (NO CHARGE) OPTIME
TOPICAL | Status: DC | PRN
  Administered 2018-05-18: 1

## 2018-05-18 MED ORDER — OXYCODONE-ACETAMINOPHEN 5-325 MG PO TABS
1.0000 | ORAL_TABLET | ORAL | Status: DC | PRN
  Administered 2018-05-18 – 2018-05-19 (×4): 1 via ORAL
  Filled 2018-05-18 (×4): qty 1

## 2018-05-18 MED ORDER — SUGAMMADEX SODIUM 200 MG/2ML IV SOLN
INTRAVENOUS | Status: DC | PRN
  Administered 2018-05-18: 200 mg via INTRAVENOUS

## 2018-05-18 MED ORDER — ACETAMINOPHEN 160 MG/5ML PO SOLN: 1000.0000 mg | Freq: Once | ORAL | Status: DC | PRN

## 2018-05-18 MED ORDER — METHOCARBAMOL 1000 MG/10ML IJ SOLN
500.0000 mg | Freq: Four times a day (QID) | INTRAVENOUS | Status: DC | PRN
  Filled 2018-05-18: qty 5

## 2018-05-18 MED ORDER — FENTANYL CITRATE (PF) 100 MCG/2ML IJ SOLN
INTRAMUSCULAR | Status: DC | PRN
  Administered 2018-05-18 (×5): 50 ug via INTRAVENOUS

## 2018-05-18 MED ORDER — OXYCODONE HCL 5 MG PO TABS: 5.0000 mg | ORAL_TABLET | ORAL | Status: DC | PRN

## 2018-05-18 MED ORDER — BUPIVACAINE HCL (PF) 0.25 % IJ SOLN
INTRAMUSCULAR | Status: AC
  Filled 2018-05-18: qty 30

## 2018-05-18 MED ORDER — FENTANYL CITRATE (PF) 100 MCG/2ML IJ SOLN
25.0000 ug | INTRAMUSCULAR | Status: DC | PRN
  Administered 2018-05-18 (×2): 50 ug via INTRAVENOUS
  Administered 2018-05-18 (×2): 25 ug via INTRAVENOUS

## 2018-05-18 MED ORDER — METHOCARBAMOL 500 MG PO TABS
ORAL_TABLET | ORAL | Status: AC
  Filled 2018-05-18: qty 1

## 2018-05-18 MED ORDER — OXYCODONE HCL 5 MG/5ML PO SOLN: 5.0000 mg | Freq: Once | ORAL | Status: AC | PRN

## 2018-05-18 MED ORDER — METHOCARBAMOL 500 MG PO TABS
500.0000 mg | ORAL_TABLET | Freq: Four times a day (QID) | ORAL | Status: DC | PRN
  Administered 2018-05-18 – 2018-05-19 (×3): 500 mg via ORAL
  Filled 2018-05-18 (×4): qty 1

## 2018-05-18 MED ORDER — CHLORHEXIDINE GLUCONATE 4 % EX LIQD: 60.0000 mL | Freq: Once | CUTANEOUS | Status: DC

## 2018-05-18 MED ORDER — MIDAZOLAM HCL 2 MG/2ML IJ SOLN
INTRAMUSCULAR | Status: AC
  Filled 2018-05-18: qty 2

## 2018-05-18 MED ORDER — ACETAMINOPHEN 500 MG PO TABS: 1000.0000 mg | ORAL_TABLET | Freq: Once | ORAL | Status: DC | PRN

## 2018-05-18 MED ORDER — SODIUM CHLORIDE 0.9 % IV SOLN: 250.0000 mL | INTRAVENOUS | Status: DC

## 2018-05-18 MED ORDER — CEFAZOLIN SODIUM-DEXTROSE 2-4 GM/100ML-% IV SOLN
INTRAVENOUS | Status: AC
  Filled 2018-05-18: qty 100

## 2018-05-18 MED ORDER — PHENOL 1.4 % MT LIQD: 1.0000 | OROMUCOSAL | Status: DC | PRN

## 2018-05-18 MED ORDER — OXYCODONE HCL 5 MG PO TABS
5.0000 mg | ORAL_TABLET | Freq: Once | ORAL | Status: AC | PRN
  Administered 2018-05-18: 5 mg via ORAL

## 2018-05-18 MED ORDER — MIDAZOLAM HCL 2 MG/2ML IJ SOLN
INTRAMUSCULAR | Status: DC | PRN
  Administered 2018-05-18: 2 mg via INTRAVENOUS

## 2018-05-18 SURGICAL SUPPLY — 44 items
AGENT HMST KT MTR STRL THRMB (HEMOSTASIS) ×1
BUR ROUND FLUTED 4 SOFT TCH (BURR) ×1 IMPLANT
CANISTER SUCT 3000ML PPV (MISCELLANEOUS) ×2 IMPLANT
CLSR STERI-STRIP ANTIMIC 1/2X4 (GAUZE/BANDAGES/DRESSINGS) ×2 IMPLANT
COVER SURGICAL LIGHT HANDLE (MISCELLANEOUS) ×2 IMPLANT
COVER WAND RF STERILE (DRAPES) ×1 IMPLANT
DECANTER SPIKE VIAL GLASS SM (MISCELLANEOUS) ×2 IMPLANT
DRAPE HALF SHEET 40X57 (DRAPES) ×4 IMPLANT
DRAPE MICROSCOPE LEICA (MISCELLANEOUS) ×2 IMPLANT
DRAPE SURG 17X23 STRL (DRAPES) ×2 IMPLANT
DRSG MEPILEX BORDER 4X4 (GAUZE/BANDAGES/DRESSINGS) ×2 IMPLANT
DURAPREP 26ML APPLICATOR (WOUND CARE) ×2 IMPLANT
ELECT REM PT RETURN 9FT ADLT (ELECTROSURGICAL) ×2
ELECTRODE REM PT RTRN 9FT ADLT (ELECTROSURGICAL) ×1 IMPLANT
GLOVE BIOGEL PI IND STRL 8 (GLOVE) ×2 IMPLANT
GLOVE BIOGEL PI INDICATOR 8 (GLOVE) ×2
GLOVE ORTHO TXT STRL SZ7.5 (GLOVE) ×5 IMPLANT
GOWN STRL REUS W/ TWL LRG LVL3 (GOWN DISPOSABLE) ×2 IMPLANT
GOWN STRL REUS W/ TWL XL LVL3 (GOWN DISPOSABLE) ×1 IMPLANT
GOWN STRL REUS W/TWL 2XL LVL3 (GOWN DISPOSABLE) ×2 IMPLANT
GOWN STRL REUS W/TWL LRG LVL3 (GOWN DISPOSABLE) ×4
GOWN STRL REUS W/TWL XL LVL3 (GOWN DISPOSABLE) ×2
KIT BASIN OR (CUSTOM PROCEDURE TRAY) ×2 IMPLANT
KIT TURNOVER KIT B (KITS) ×2 IMPLANT
MANIFOLD NEPTUNE II (INSTRUMENTS) ×1 IMPLANT
NDL HYPO 25GX1X1/2 BEV (NEEDLE) ×1 IMPLANT
NDL SPNL 18GX3.5 QUINCKE PK (NEEDLE) ×1 IMPLANT
NEEDLE HYPO 25GX1X1/2 BEV (NEEDLE) ×2 IMPLANT
NEEDLE SPNL 18GX3.5 QUINCKE PK (NEEDLE) ×2 IMPLANT
NS IRRIG 1000ML POUR BTL (IV SOLUTION) ×2 IMPLANT
PACK LAMINECTOMY ORTHO (CUSTOM PROCEDURE TRAY) ×2 IMPLANT
PAD ARMBOARD 7.5X6 YLW CONV (MISCELLANEOUS) ×4 IMPLANT
PATTIES SURGICAL .5 X.5 (GAUZE/BANDAGES/DRESSINGS) IMPLANT
PATTIES SURGICAL .75X.75 (GAUZE/BANDAGES/DRESSINGS) IMPLANT
SURGIFLO W/THROMBIN 8M KIT (HEMOSTASIS) ×1 IMPLANT
SUT VIC AB 0 CT1 27 (SUTURE)
SUT VIC AB 0 CT1 27XBRD ANBCTR (SUTURE) IMPLANT
SUT VIC AB 1 CTX 36 (SUTURE) ×2
SUT VIC AB 1 CTX36XBRD ANBCTR (SUTURE) ×1 IMPLANT
SUT VIC AB 2-0 CT1 27 (SUTURE) ×2
SUT VIC AB 2-0 CT1 TAPERPNT 27 (SUTURE) ×1 IMPLANT
SUT VIC AB 3-0 X1 27 (SUTURE) ×2 IMPLANT
TOWEL OR 17X24 6PK STRL BLUE (TOWEL DISPOSABLE) ×2 IMPLANT
TOWEL OR 17X26 10 PK STRL BLUE (TOWEL DISPOSABLE) ×2 IMPLANT

## 2018-05-18 NOTE — H&P (Signed)
Richard Middleton is an 73 y.o. male.   Chief Complaint: low back pain, right LE radiculopathy with weakness  HPI: patient with hx of severe L4-5 stenosis and above complaint presents for surgical intervention.  Progressively worsening symptoms over the last couple of weeks.  Failed conservative treatment.   Past Medical History:  Diagnosis Date  . Abnormal glucose   . History of small bowel obstruction    x2  . Pneumonia    as a child    Past Surgical History:  Procedure Laterality Date  . ACHILLES TENDON SURGERY Right 05/02/2017   Procedure: RIGHT ACHILLES RECONSTRUCTION;  Surgeon: Newt Minion, MD;  Location: [facility];  Service: Orthopedics;  Laterality: Right;  . COLONOSCOPY  2008  . LAPAROTOMY N/A 07/13/2014   Procedure: EXPLORATORY LAPAROTOMY lysis of adhesions;  Surgeon: Leighton Ruff, MD;  Location: [facility];  Service: General;  Laterality: N/A;  . RESECTION SMALL BOWEL / CLOSURE ILEOSTOMY  3 1/2 years ago, & 10 years ago   x2    Family History  Problem Relation Age of Onset  . Arthritis Mother   . Pulmonary embolism Father   . Cancer Neg Hx   . Diabetes Neg Hx   . Colon cancer Neg Hx    Social History:  reports that he quit smoking about 48 years ago. His smoking use included cigarettes. He started smoking about 52 years ago. He smoked 4.00 packs per day. He has never used smokeless tobacco. He reports that he does not drink alcohol or use drugs.  Allergies: No Known Allergies  No medications prior to admission.    No results found for this or any previous visit (from the past 48 hour(s)). No results found.  Review of Systems  Constitutional: Negative.   HENT: Negative.   Respiratory: Negative.   Cardiovascular: Negative.   Gastrointestinal: Negative.   Genitourinary: Negative.   Musculoskeletal: Positive for back pain.  Skin: Negative.   Neurological: Positive for tingling and weakness (right LE extremity).  Psychiatric/Behavioral: Negative.     There were no  vitals taken for this visit. Physical Exam  Constitutional: He is oriented to person, place, and time. He appears well-developed. No distress.  HENT:  Head: Normocephalic and atraumatic.  Eyes: Pupils are equal, round, and reactive to light. EOM are normal.  Neck: Normal range of motion.  Cardiovascular: Normal heart sounds.  Respiratory: Effort normal and breath sounds normal. No respiratory distress.  GI: Soft. Bowel sounds are normal. He exhibits no distension. There is no abdominal tenderness.  Musculoskeletal:     Comments: Positive right SLR.  Right ant tib weakness.   Neurological: He is alert and oriented to person, place, and time.  Skin: Skin is warm and dry.  Psychiatric: He has a normal mood and affect.     Assessment/Plan Severe L4-5 stenosis   Will proceed with L4-5 DECOMPRESSION, RIGHT MICRODISCECTOMY as scheduled.  Surgical procedure discussed in detail along with possible risks and complication.  All questions answered and patient wishes to proceed.   Benjiman Core, PA-C 05/18/2018, 9:44 AM

## 2018-05-18 NOTE — Anesthesia Procedure Notes (Signed)
Procedure Name: Intubation Date/Time: 05/18/2018 3:35 PM Performed by: Genelle Bal, CRNA Pre-anesthesia Checklist: Patient identified, Emergency Drugs available, Suction available and Patient being monitored Patient Re-evaluated:Patient Re-evaluated prior to induction Oxygen Delivery Method: Circle system utilized Preoxygenation: Pre-oxygenation with 100% oxygen Induction Type: IV induction Ventilation: Mask ventilation without difficulty Laryngoscope Size: Miller and 2 Grade View: Grade I Tube type: Oral Tube size: 7.5 mm Number of attempts: 1 Airway Equipment and Method: Stylet Placement Confirmation: ETT inserted through vocal cords under direct vision,  positive ETCO2 and breath sounds checked- equal and bilateral Secured at: 23 cm Tube secured with: Tape Dental Injury: Teeth and Oropharynx as per pre-operative assessment

## 2018-05-18 NOTE — Interval H&P Note (Signed)
History and Physical Interval Note:  05/18/2018 3:02 PM  Richard Middleton  has presented today for surgery, with the diagnosis of L4-5 stenosis, right foot drop  The various methods of treatment have been discussed with the patient and family. After consideration of risks, benefits and other options for treatment, the patient has consented to  Procedure(s): L4-5 DECOMPRESSION, RIGHT MICRODISCECTOMY (N/A) as a surgical intervention .  The patient's history has been reviewed, patient examined, no change in status, stable for surgery.  I have reviewed the patient's chart and labs.  Questions were answered to the patient's satisfaction.     Marybelle Killings

## 2018-05-18 NOTE — Op Note (Signed)
Preop diagnosis: Severe L4-5 lumbar stenosis with right paracentral disc protrusion and right partial foot drop.  Postop diagnosis: Same  Procedure: L4-5 decompression centrally and lateral recess with foraminotomy and right microdiscectomy L4-5.  Surgeon: Rodell Perna, MD  Anesthesia: General plus Marcaine local  Assistant: Benjiman Core, PA-C medically necessary and present for the entire procedure  EBL: See anesthetic record approximately 200 cc  Procedure after induction general anesthesia standard prepping and draping with the patient on chest roll.  Informed consent had been obtained Ancef prophylaxis DuraPrep there is squared with towels.  Calf pumpers careful padding rolled towels underneath the arms and yellow pads underneath the ulnar nerve.  Timeout procedure was performed wet prep was drying her squared with towels. Steri-Drape after sterile skin marker and laminectomy sheet and draped.  Midline incision was made after crosstable lateral x-ray confirmed spinal needles almost exactly at the appropriate level.  Incision was made subperiosteal dissection and then a second x-ray was taken with Coker clamps placed down across spinous process down to the level lamina below the L4-5 disc space and above the disc space.  Patient had severe stenosis at the level of the disc and extended halfway up the vertebral body and halfway below.  Top portion of spinous process of L5 was taken laminotomies performed right and left at L5 thinning with a bur.  Laminectomy at L4 leaving just the top portion of L4.  There is a very hypertrophic thick chunks of ligament operative microscope was draped and brought in and thick chunks of ligament were removed decompressing the dura.  Bone on the sides removed until the lateral recess was decompressed.  On the right side there was large disc firm protrusion with grade 1 anterolisthesis L4 on L5.  Initially only a 15 scalpel would fit into the disc after while the  micropituitary was able to be placed inside of the disc after enlarging slightly with a 2 mm Kerrison using the D'Errico to protect the dura.  No fragments throughout the canal.  There was followed underneath the pedicle and portion of the foramina was opened up.  Dural tube was round.  We looked at the disc on the opposite side it was hard bulging but not ballotable.  Bone was removed at the level of pedicle and there was no areas of compression proximally.  No fragments were noted ventral to the dura.  Copious irrigation some Surgi-Flo placed in the gutters we waited a few minutes then removed it operative field was dry then standard layer closure #1 Vicryl deep fascia 2-0 Vicryl in subcuticular skin closure.  Patient tolerated the procedure well transfer the care of room in stable condition.

## 2018-05-18 NOTE — Anesthesia Preprocedure Evaluation (Addendum)
Anesthesia Evaluation  Patient identified by MRN, date of birth, ID band Patient awake    Reviewed: Allergy & Precautions, NPO status , Patient's Chart, lab work & pertinent test results  History of Anesthesia Complications Negative for: history of anesthetic complications  Airway Mallampati: II  TM Distance: >3 FB Neck ROM: Full    Dental  (+) Dental Advisory Given, Teeth Intact   Pulmonary former smoker,    breath sounds clear to auscultation       Cardiovascular negative cardio ROS   Rhythm:Regular Rate:Normal     Neuro/Psych negative neurological ROS  negative psych ROS   GI/Hepatic negative GI ROS, Neg liver ROS,   Endo/Other  negative endocrine ROS  Renal/GU negative Renal ROS     Musculoskeletal negative musculoskeletal ROS (+)   Abdominal   Peds  Hematology negative hematology ROS (+)   Anesthesia Other Findings   Reproductive/Obstetrics                            Anesthesia Physical Anesthesia Plan  ASA: II  Anesthesia Plan: General   Post-op Pain Management:    Induction: Intravenous  PONV Risk Score and Plan: 2 and Ondansetron and Dexamethasone  Airway Management Planned: Oral ETT  Additional Equipment: None  Intra-op Plan:   Post-operative Plan: Extubation in OR  Informed Consent: I have reviewed the patients History and Physical, chart, labs and discussed the procedure including the risks, benefits and alternatives for the proposed anesthesia with the patient or authorized representative who has indicated his/her understanding and acceptance.     Dental advisory given  Plan Discussed with: CRNA and Surgeon  Anesthesia Plan Comments:         Anesthesia Quick Evaluation

## 2018-05-18 NOTE — Transfer of Care (Signed)
Immediate Anesthesia Transfer of Care Note  Patient: KALADIN NOSEWORTHY  Procedure(s) Performed: L4-5 DECOMPRESSION, RIGHT MICRODISCECTOMY (N/A Back)  Patient Location: PACU  Anesthesia Type:General  Level of Consciousness: awake and alert   Airway & Oxygen Therapy: Patient Spontanous Breathing and Patient connected to nasal cannula oxygen  Post-op Assessment: Report given to RN and Post -op Vital signs reviewed and stable  Post vital signs: Reviewed and stable  Last Vitals:  Vitals Value Taken Time  BP 127/73 05/18/2018  5:17 PM  Temp 36.1 C 05/18/2018  5:17 PM  Pulse 66 05/18/2018  5:20 PM  Resp 18 05/18/2018  5:20 PM  SpO2 92 % 05/18/2018  5:20 PM  Vitals shown include unvalidated device data.  Last Pain:  Vitals:   05/18/18 1227  TempSrc:   PainSc: 0-No pain      Patients Stated Pain Goal: 5 (53/00/51 1021)  Complications: No apparent anesthesia complications

## 2018-05-19 ENCOUNTER — Encounter (HOSPITAL_COMMUNITY): Payer: Self-pay | Admitting: Orthopaedic Surgery

## 2018-05-19 DIAGNOSIS — M48061 Spinal stenosis, lumbar region without neurogenic claudication: Secondary | ICD-10-CM | POA: Diagnosis not present

## 2018-05-19 DIAGNOSIS — M5126 Other intervertebral disc displacement, lumbar region: Secondary | ICD-10-CM | POA: Diagnosis not present

## 2018-05-19 DIAGNOSIS — Z87891 Personal history of nicotine dependence: Secondary | ICD-10-CM | POA: Diagnosis not present

## 2018-05-19 DIAGNOSIS — M21371 Foot drop, right foot: Secondary | ICD-10-CM | POA: Diagnosis not present

## 2018-05-19 MED ORDER — METHOCARBAMOL 500 MG PO TABS: 500.0000 mg | ORAL_TABLET | Freq: Four times a day (QID) | ORAL | 0 refills | Status: DC | PRN

## 2018-05-19 MED ORDER — OXYCODONE-ACETAMINOPHEN 5-325 MG PO TABS: 1.0000 | ORAL_TABLET | ORAL | 0 refills | Status: DC | PRN

## 2018-05-19 NOTE — Evaluation (Signed)
Physical Therapy Evaluation and Discharge  Patient Details Name: Richard Middleton MRN: [id number] DOB: 1945/12/06 Today's Date: 05/19/2018   History of Present Illness  Pt s/p L4-5 decompression centrally and lateral recess with foraminotomy and right microdiscectomy L4-5.  Clinical Impression  Patient evaluated by Physical Therapy with no further acute PT needs identified. All education has been completed and the patient has no further questions. At the time of PT eval pt was able to perform transfers and ambulation with gross modified independence and no AD. Pt was educated on precautions, car transfer, activity progression, and stair negotiation. See below for any follow-up Physical Therapy or equipment needs. PT is signing off. Thank you for this referral.   Follow Up Recommendations Outpatient PT(When appropriate per post-op protocol.)    Equipment Recommendations  None recommended by PT    Recommendations for Other Services       Precautions / Restrictions Precautions Precautions: Back Precaution Booklet Issued: Yes (comment) Precaution Comments: Reviewed precautions verbally throughout functional mobility.  Restrictions Weight Bearing Restrictions: No      Mobility  Bed Mobility Overal bed mobility: Modified Independent             General bed mobility comments: HOB flat and rails lowered to simulate home environment.   Transfers Overall transfer level: Modified independent Equipment used: None             General transfer comment: Increased time to power-up to full stand due to stiffness, however no assist required.   Ambulation/Gait Ambulation/Gait assistance: Modified independent (Device/Increase time) Gait Distance (Feet): 400 Feet Assistive device: None Gait Pattern/deviations: Step-through pattern;Decreased stride length;Trunk flexed;Antalgic Gait velocity: Decreased Gait velocity interpretation: 1.31 - 2.62 ft/sec, indicative of limited community  ambulator General Gait Details: VC's for improved posture. Pt with grossly antalgic gait with externally rotated RLE.   Stairs Stairs: Yes Stairs assistance: Supervision Stair Management: One rail Right;Alternating pattern;Step to pattern;Forwards Number of Stairs: 5 General stair comments: VC's for sequencing and general safety. No assist required however close supervision provided for safety.   Wheelchair Mobility    Modified Rankin (Stroke Patients Only)       Balance Overall balance assessment: No apparent balance deficits (not formally assessed)                                           Pertinent Vitals/Pain Pain Assessment: Faces Pain Score: 3  Faces Pain Scale: Hurts a little bit Pain Location: incisional Pain Descriptors / Indicators: Operative site guarding;Discomfort Pain Intervention(s): Monitored during session    Home Living Family/patient expects to be discharged to:: Private residence Living Arrangements: Spouse/significant other Available Help at Discharge: Family;Available 24 hours/day Type of Home: House Home Access: Stairs to enter Entrance Stairs-Rails: None Entrance Stairs-Number of Steps: 2 Home Layout: Two level Home Equipment: None      Prior Function Level of Independence: Independent               Hand Dominance   Dominant Hand: Right    Extremity/Trunk Assessment   Upper Extremity Assessment Upper Extremity Assessment: Defer to OT evaluation    Lower Extremity Assessment Lower Extremity Assessment: Generalized weakness;RLE deficits/detail RLE Deficits / Details: Weakness consistent with pre-op diagnosis, however RLE externally rotating with swing through. Pt reports he has decreased sensation on the R foot.  RLE Sensation: decreased light touch;decreased proprioception RLE Coordination: decreased gross  motor    Cervical / Trunk Assessment Cervical / Trunk Assessment: Other exceptions Cervical / Trunk  Exceptions: s/p surgery  Communication   Communication: No difficulties  Cognition Arousal/Alertness: Awake/alert Behavior During Therapy: WFL for tasks assessed/performed Overall Cognitive Status: Within Functional Limits for tasks assessed                                        General Comments      Exercises     Assessment/Plan    PT Assessment Patent does not need any further PT services  PT Problem List         PT Treatment Interventions      PT Goals (Current goals can be found in the Care Plan section)  Acute Rehab PT Goals Patient Stated Goal: home today PT Goal Formulation: All assessment and education complete, DC therapy    Frequency     Barriers to discharge        Co-evaluation               AM-PAC PT "6 Clicks" Mobility  Outcome Measure Help needed turning from your back to your side while in a flat bed without using bedrails?: None Help needed moving from lying on your back to sitting on the side of a flat bed without using bedrails?: None Help needed moving to and from a bed to a chair (including a wheelchair)?: None Help needed standing up from a chair using your arms (e.g., wheelchair or bedside chair)?: None Help needed to walk in hospital room?: None Help needed climbing 3-5 steps with a railing? : A Little 6 Click Score: 23    End of Session Equipment Utilized During Treatment: Gait belt Activity Tolerance: Patient tolerated treatment well Patient left: in bed;with call bell/phone within reach;with family/visitor present Nurse Communication: Mobility status PT Visit Diagnosis: Unsteadiness on feet (R26.81);Pain;Other symptoms and signs involving the nervous system (R29.898) Pain - part of body: (back)    Time: 7616-0737 PT Time Calculation (min) (ACUTE ONLY): 17 min   Charges:   PT Evaluation $PT Eval Moderate Complexity: 1 Mod          Rolinda Roan, PT, DPT Acute Rehabilitation Services Pager:  [phone] Office: [phone]   Thelma Comp 05/19/2018, 10:18 AM

## 2018-05-19 NOTE — Discharge Instructions (Signed)
Ok to shower. Walk daily, avoid bending and lifting. Come see Riven Mabile PA-C in about one week call [phone] for appointment time if you do not already have one.

## 2018-05-19 NOTE — Assessment & Plan Note (Signed)
Lumbar spinal stenosis L4-5 with disc protrusion and right partial foot drop

## 2018-05-19 NOTE — Evaluation (Signed)
Occupational Therapy Evaluation and Discharge Patient Details Name: Richard Middleton MRN: [id number] DOB: 15-Apr-1946 Today's Date: 05/19/2018    History of Present Illness Pt s/p L4-5 decompression centrally and lateral recess with foraminotomy and right microdiscectomy L4-5.   Clinical Impression   This 73 yo male admitted with above presents to acute OT with all education completed, we will D/C from acute OT.     Follow Up Recommendations  No OT follow up    Equipment Recommendations  None recommended by OT       Precautions / Restrictions Precautions Precautions: Back Precaution Booklet Issued: Yes (comment) Restrictions Weight Bearing Restrictions: No      Mobility Bed Mobility Overal bed mobility: Modified Independent             General bed mobility comments: VCs for technique  Transfers Overall transfer level: Independent Equipment used: None                  Balance Overall balance assessment: No apparent balance deficits (not formally assessed)                                         ADL either performed or assessed with clinical judgement   ADL                                         General ADL Comments: Educated pt on crossing legs to get to feet for socks and shoes, sequence of dressing, using 2 cups for brushing teeth to avoid bending over the sink, not sitting for more than 20-30 minutes at a time, easier stance for sit<>stand, using wet wipes for back peri care, side-step into tub     Vision Patient Visual Report: No change from baseline              Pertinent Vitals/Pain Pain Assessment: 0-10 Pain Score: 3  Pain Location: incisional Pain Descriptors / Indicators: Sore Pain Intervention(s): Limited activity within patient's tolerance;Monitored during session     Hand Dominance Right   Extremity/Trunk Assessment Upper Extremity Assessment Upper Extremity Assessment: Overall WFL for tasks  assessed           Communication Communication Communication: No difficulties   Cognition Arousal/Alertness: Awake/alert Behavior During Therapy: WFL for tasks assessed/performed Overall Cognitive Status: Within Functional Limits for tasks assessed                                                Home Living Family/patient expects to be discharged to:: Private residence Living Arrangements: Spouse/significant other Available Help at Discharge: Family;Available 24 hours/day Type of Home: House Home Access: Stairs to enter [employer] of Steps: 2 Entrance Stairs-Rails: None Home Layout: Two level Alternate Level Stairs-Number of Steps: steps-landing-steps Alternate Level Stairs-Rails: Left(going up) Bathroom Shower/Tub: Teacher, early years/pre: Standard     Home Equipment: None          Prior Functioning/Environment Level of Independence: Independent                          OT Goals(Current goals can be found in the care  plan section) Acute Rehab OT Goals Patient Stated Goal: home today  OT Frequency:                AM-PAC OT "6 Clicks" Daily Activity     Outcome Measure Help from another person eating meals?: None Help from another person taking care of personal grooming?: None Help from another person toileting, which includes using toliet, bedpan, or urinal?: None Help from another person bathing (including washing, rinsing, drying)?: None Help from another person to put on and taking off regular upper body clothing?: None Help from another person to put on and taking off regular lower body clothing?: None 6 Click Score: 24   End of Session Nurse Communication: (no further OT needs communicated on sheet at desk)  Activity Tolerance: Patient tolerated treatment well Patient left: (sitting EOB eating breakfast)  OT Visit Diagnosis: Pain Pain - part of body: (incisional)                Time: 2831-5176 OT  Time Calculation (min): 28 min Charges:  OT General Charges $OT Visit: 1 Visit OT Evaluation $OT Eval Moderate Complexity: 1 Mod OT Treatments $Self Care/Home Management : 8-22 mins  [place], OTR/L Acute [employer] Pager [phone] Office [phone]     Almon Register 05/19/2018, 8:22 AM

## 2018-05-19 NOTE — Progress Notes (Signed)
Patient alert and oriented, mae's well, voiding adequate amount of urine, swallowing without difficulty, no c/o pain at time of discharge. Patient discharged home with family. Script and discharged instructions given to patient. Patient and family stated understanding of instructions given. Patient has an appointment with Dr. Yates  

## 2018-05-20 ENCOUNTER — Telehealth (INDEPENDENT_AMBULATORY_CARE_PROVIDER_SITE_OTHER): Payer: Self-pay | Admitting: Orthopaedic Surgery

## 2018-05-20 NOTE — Telephone Encounter (Signed)
I called and discussed. Suppository and if not effective enema. FYi

## 2018-05-20 NOTE — Telephone Encounter (Signed)
Patient's wife called stating that the patient is experiencing extreme constipation and is using the stool softener, but it is not helping, she would like to know what he can take along with the list of medication he is on.  CB#[phone].  Thank you.

## 2018-05-20 NOTE — Anesthesia Postprocedure Evaluation (Signed)
Anesthesia Post Note  Patient: Richard Middleton  Procedure(s) Performed: L4-5 DECOMPRESSION, RIGHT MICRODISCECTOMY (N/A Back)     Patient location during evaluation: PACU Anesthesia Type: General Level of consciousness: awake and alert Pain management: pain level controlled Vital Signs Assessment: post-procedure vital signs reviewed and stable Respiratory status: spontaneous breathing, nonlabored ventilation, respiratory function stable and patient connected to nasal cannula oxygen Cardiovascular status: blood pressure returned to baseline and stable Postop Assessment: no apparent nausea or vomiting Anesthetic complications: no    Last Vitals:  Vitals:   05/19/18 0350 05/19/18 0850  BP: 128/87 138/85  Pulse: 80 80  Resp: 18 16  Temp: 36.9 C   SpO2: 99% 99%    Last Pain:  Vitals:   05/19/18 1123  TempSrc:   PainSc: 3                  Lillia Lengel

## 2018-05-20 NOTE — Telephone Encounter (Signed)
Please advise 

## 2018-05-21 NOTE — Telephone Encounter (Signed)
noted 

## 2018-05-22 NOTE — Discharge Summary (Signed)
Patient ID: Richard Middleton MRN: [id number] DOB/AGE: [birth year] 73 y.o.  Admit date: 05/18/2018 Discharge date: 05/22/2018  Admission Diagnoses:  Active Problems:   Spinal stenosis of lumbar region   Discharge Diagnoses:  Active Problems:   Spinal stenosis of lumbar region  status post Procedure(s): L4-5 DECOMPRESSION, RIGHT MICRODISCECTOMY  Past Medical History:  Diagnosis Date  . Abnormal glucose   . History of small bowel obstruction    x2  . Pneumonia    as a child    Surgeries: Procedure(s): L4-5 DECOMPRESSION, RIGHT MICRODISCECTOMY on 05/18/2018   Consultants:   Discharged Condition: Improved  Hospital Course: Richard Middleton is an 73 y.o. male who was admitted 05/18/2018 for operative treatment of lumbar stenosis. Patient failed conservative treatments (please see the history and physical for the specifics) and had severe unremitting pain that affects sleep, daily activities and work/hobbies. After pre-op clearance, the patient was taken to the operating room on 05/18/2018 and underwent  Procedure(s): L4-5 DECOMPRESSION, RIGHT MICRODISCECTOMY.    Patient was given perioperative antibiotics:  Anti-infectives (From admission, onward)   Start     Dose/Rate Route Frequency Ordered Stop   05/18/18 2300  ceFAZolin (ANCEF) IVPB 1 g/50 mL premix     1 g 100 mL/hr over 30 Minutes Intravenous Every 8 hours 05/18/18 1917 05/19/18 0448   05/18/18 1200  ceFAZolin (ANCEF) IVPB 2g/100 mL premix     2 g 200 mL/hr over 30 Minutes Intravenous On call to O.R. 05/18/18 1151 05/18/18 1551   05/18/18 1156  ceFAZolin (ANCEF) 2-4 GM/100ML-% IVPB    Note to Pharmacy:  Nyoka Cowden   : cabinet override      05/18/18 1156 05/18/18 1521       Patient was given sequential compression devices and early ambulation to prevent DVT.   Patient benefited maximally from hospital stay and there were no complications. At the time of discharge, the patient was urinating/moving their bowels without  difficulty, tolerating a regular diet, pain is controlled with oral pain medications and they have been cleared by PT/OT.   Recent vital signs: No data found.   Recent laboratory studies: No results for input(s): WBC, HGB, HCT, PLT, NA, K, CL, CO2, BUN, CREATININE, GLUCOSE, INR, CALCIUM in the last 72 hours.  Invalid input(s): PT, 2   Discharge Medications:   Allergies as of 05/19/2018   No Known Allergies     Medication List    TAKE these medications   ibuprofen 600 MG tablet Commonly known as:  ADVIL,MOTRIN Take 600 mg by mouth daily as needed for moderate pain.   methocarbamol 500 MG tablet Commonly known as:  ROBAXIN Take 1 tablet (500 mg total) by mouth every 6 (six) hours as needed for muscle spasms.   oxyCODONE-acetaminophen 5-325 MG tablet Commonly known as:  PERCOCET/ROXICET Take 1-2 tablets by mouth every 4 (four) hours as needed for moderate pain or severe pain.   TART CHERRY ADVANCED PO Take 30 mLs by mouth daily. Mix in drink one daily   VITAMIN D PO Take 1 capsule by mouth daily.   Zoster Vaccine Adjuvanted injection Commonly known as:  SHINGRIX 0.5 ml in muscle and repeat in 8 weeks       Diagnostic Studies: Dg Lumbar Spine 2-3 Views  Result Date: 05/18/2018 CLINICAL DATA:  Localization for surgery. EXAM: LUMBAR SPINE - 2-3 VIEW COMPARISON:  MRI May 03, 2018 FINDINGS: Surgical markers are seen posterior to L4 and L5. Degenerative changes at L5-S1. IMPRESSION: Surgical markers  are seen posterior to L4 and L5. Electronically Signed   By: Dorise Bullion III M.D   On: 05/18/2018 16:22   Mr Lumbar Spine Wo Contrast  Result Date: 05/03/2018 CLINICAL DATA:  Right low back pain going into the right leg. EXAM: MRI LUMBAR SPINE WITHOUT CONTRAST TECHNIQUE: Multiplanar, multisequence MR imaging of the lumbar spine was performed. No intravenous contrast was administered. COMPARISON:  None. FINDINGS: Segmentation:  Standard. Alignment:  Physiologic. Vertebrae:  No  fracture, evidence of discitis, or bone lesion. Conus medullaris and cauda equina: Conus extends to the L1 level. Conus and cauda equina appear normal. Paraspinal and other soft tissues: No acute paraspinal abnormality. Disc levels: Disc spaces: Degenerative disc disease with disc height loss at L5-S1 and to lesser extent L4-5. Disc desiccation throughout the lumbar spine. T12-L1: No significant disc bulge. No evidence of neural foraminal stenosis. No central canal stenosis. L1-L2: Mild broad-based disc bulge. Left lateral recess stenosis. No foraminal stenosis. No central canal stenosis. L2-L3: Broad-based disc bulge flattening the ventral thecal sac. Mild bilateral facet arthropathy. Mild spinal stenosis. No evidence of neural foraminal stenosis. L3-L4: Mild broad-based disc bulge. Mild bilateral facet arthropathy. Bilateral lateral recess narrowing. Mild spinal stenosis. Mild bilateral foraminal stenosis. L4-L5: Broad-based disc bulge flattening the ventral thecal sac. Moderate bilateral facet arthropathy with severe ligamentum flavum infolding. Severe spinal stenosis. Moderate right and mild left foraminal stenosis. L5-S1: Broad-based disc bulge flattening the ventral thecal sac. Mild bilateral facet arthropathy. Severe right and moderate left foraminal stenosis. Bilateral lateral recess narrowing. IMPRESSION: 1. Diffuse lumbar spine spondylosis as described above. 2. At L4-5 there is a broad-based disc bulge flattening the ventral thecal sac. Moderate bilateral facet arthropathy with severe ligamentum flavum infolding. Severe spinal stenosis. Moderate right and mild left foraminal stenosis. Electronically Signed   By: Kathreen Devoid   On: 05/03/2018 15:31   Vas [place] Lower Extremity Venous (dvt)  Result Date: 04/27/2018  Lower Venous Study Indications: Pain.  Performing Technologist: Oliver Hum RVT  Examination Guidelines: A complete evaluation includes B-mode imaging, spectral Doppler, color Doppler, and  power Doppler as needed of all accessible portions of each vessel. Bilateral testing is considered an integral part of a complete examination. Limited examinations for reoccurring indications may be performed as noted.  Right Venous Findings: +---------+---------------+---------+-----------+----------+-------+          CompressibilityPhasicitySpontaneityPropertiesSummary +---------+---------------+---------+-----------+----------+-------+ CFV      Full           Yes      Yes                          +---------+---------------+---------+-----------+----------+-------+ SFJ      Full                                                 +---------+---------------+---------+-----------+----------+-------+ FV Prox  Full                                                 +---------+---------------+---------+-----------+----------+-------+ FV Mid   Full                                                 +---------+---------------+---------+-----------+----------+-------+  FV DistalFull                                                 +---------+---------------+---------+-----------+----------+-------+ PFV      Full                                                 +---------+---------------+---------+-----------+----------+-------+ POP      Full           Yes      Yes                          +---------+---------------+---------+-----------+----------+-------+ PTV      Full                                                 +---------+---------------+---------+-----------+----------+-------+ PERO     Full                                                 +---------+---------------+---------+-----------+----------+-------+  Left Venous Findings: +---+---------------+---------+-----------+----------+-------+    CompressibilityPhasicitySpontaneityPropertiesSummary +---+---------------+---------+-----------+----------+-------+ CFVFull           Yes      Yes                           +---+---------------+---------+-----------+----------+-------+    Summary: Right: There is no evidence of deep vein thrombosis in the lower extremity. No cystic structure found in the popliteal fossa. Left: No evidence of common femoral vein obstruction.  *See table(s) above for measurements and observations. Electronically signed by Deitra Mayo MD on 04/27/2018 at 5:09:21 PM.    Final       Follow-up Information    Lanae Crumbly, PA-C Follow up in 1 week(s).   Specialties:  Physician Assistant, Orthopedic Surgery Contact information: [place] [address] [phone]           Discharge Plan:  discharge to home  Disposition:     Signed: Jennings Corado  05/22/2018, 4:14 PM

## 2018-05-28 ENCOUNTER — Ambulatory Visit (INDEPENDENT_AMBULATORY_CARE_PROVIDER_SITE_OTHER): Payer: Medicare Other | Admitting: Surgery

## 2018-05-28 ENCOUNTER — Encounter (INDEPENDENT_AMBULATORY_CARE_PROVIDER_SITE_OTHER): Payer: Self-pay | Admitting: Surgery

## 2018-05-28 DIAGNOSIS — Z9889 Other specified postprocedural states: Secondary | ICD-10-CM

## 2018-05-28 NOTE — Progress Notes (Signed)
   Post-Op Visit Note   Patient: Richard Middleton           Date of Birth: 1945-09-09           MRN: [id number] Visit Date: 05/28/2018 PCP: [place], Betty G, MD   Assessment & Plan:  Chief Complaint:  Chief Complaint  Patient presents with  . Spine - Follow-up   Visit Diagnoses:  1. History of lumbar laminectomy for spinal cord decompression     Plan: Patient doing well.  I did give him some exercises to do to see if this will help strengthen his right anterior tib.  Avoid bending twisting heavy lifting.  Follow-up in 4 weeks for recheck with Dr. Lorin Mercy.  He will pay close attention to whether or not his right drop foot symptoms are improving.  Follow-Up Instructions: Return in about 4 weeks (around 06/25/2018) for With Dr. Lorin Mercy.   Orders:  No orders of the defined types were placed in this encounter.  No orders of the defined types were placed in this encounter.   Imaging: No results found.  PMFS History: Patient Active Problem List   Diagnosis Date Noted  . Spinal stenosis of lumbar region 05/18/2018  . Spinal stenosis of lumbar region with neurogenic claudication 05/05/2018  . Foot drop, right 05/05/2018  . ED (erectile dysfunction) 08/27/2017  . BPH associated with nocturia 08/27/2017  . Achilles tendon tear, right, subsequent encounter 05/14/2017  . Seborrheic keratoses 08/21/2016  . Small bowel obstruction ([place]) 07/11/2014  . Tendinitis of forearm 04/19/2013  . LIPOMA 06/29/2010  . TINEA BARBAE 01/22/2010  . SMALL BOWEL OBSTRUCTION, HX OF 02/19/2007   Past Medical History:  Diagnosis Date  . Abnormal glucose   . History of small bowel obstruction    x2  . Pneumonia    as a child    Family History  Problem Relation Age of Onset  . Arthritis Mother   . Pulmonary embolism Father   . Cancer Neg Hx   . Diabetes Neg Hx   . Colon cancer Neg Hx     Past Surgical History:  Procedure Laterality Date  . ACHILLES TENDON SURGERY Right 05/02/2017   Procedure: RIGHT  ACHILLES RECONSTRUCTION;  Surgeon: Newt Minion, MD;  Location: [facility];  Service: Orthopedics;  Laterality: Right;  . COLONOSCOPY  2008  . LAPAROSCOPIC SMALL BOWEL RESECTION  3 1/2 years ago, & 10 years ago   x2  . LAPAROTOMY N/A 07/13/2014   Procedure: EXPLORATORY LAPAROTOMY lysis of adhesions;  Surgeon: Leighton Ruff, MD;  Location: [facility];  Service: General;  Laterality: N/A;  . LUMBAR LAMINECTOMY/DECOMPRESSION MICRODISCECTOMY N/A 05/18/2018   Procedure: L4-5 DECOMPRESSION, RIGHT MICRODISCECTOMY;  Surgeon: Marybelle Killings, MD;  Location: [facility];  Service: Orthopedics;  Laterality: N/A;   Social History   Occupational History  . Not on file  Tobacco Use  . Smoking status: Former Smoker    Packs/day: 4.00    Types: Cigarettes    Start date: 04/15/1966    Last attempt to quit: 04/15/1970    Years since quitting: 48.1  . Smokeless tobacco: Never Used  Substance and Sexual Activity  . Alcohol use: No  . Drug use: No  . Sexual activity: Not on file   Exam Pleasant black male alert and oriented in no acute distress.  Circumcision healing well.  New Steri-Strips applied.  Bilateral calves and tender.  He continues to have right anterior tib weakness not improved from preop.

## 2018-06-03 ENCOUNTER — Telehealth (INDEPENDENT_AMBULATORY_CARE_PROVIDER_SITE_OTHER): Payer: Self-pay | Admitting: Orthopaedic Surgery

## 2018-06-03 NOTE — Telephone Encounter (Signed)
2 wks post op switch to ultram # 40    One po q 6 hrs prn pain. Time to stop percocet.

## 2018-06-03 NOTE — Telephone Encounter (Signed)
OK for refill on Robaxin?

## 2018-06-03 NOTE — Telephone Encounter (Signed)
Please advise 

## 2018-06-03 NOTE — Telephone Encounter (Signed)
Ok thanks 

## 2018-06-03 NOTE — Telephone Encounter (Signed)
Pharmacy  [address]   Medication refill  Methocarbamol/ oxycodone    Please call patient when medication refills are sent

## 2018-06-04 ENCOUNTER — Telehealth (INDEPENDENT_AMBULATORY_CARE_PROVIDER_SITE_OTHER): Payer: Self-pay | Admitting: Orthopaedic Surgery

## 2018-06-04 MED ORDER — TRAMADOL HCL 50 MG PO TABS: 50.0000 mg | ORAL_TABLET | Freq: Four times a day (QID) | ORAL | 0 refills | Status: DC | PRN

## 2018-06-04 MED ORDER — METHOCARBAMOL 500 MG PO TABS: 500.0000 mg | ORAL_TABLET | Freq: Four times a day (QID) | ORAL | 0 refills | Status: DC | PRN

## 2018-06-04 NOTE — Telephone Encounter (Signed)
Patient called advised the pain medicine was not called into the pharmacy. Patient said he only got the muscle relaxer. The number to contact patient is [phone]

## 2018-06-04 NOTE — Telephone Encounter (Signed)
Voicemail was left at pharmacy with instructions. I called pharmacy and they are closed until 2pm. Will try again when I can speak with an associate.

## 2018-06-04 NOTE — Telephone Encounter (Signed)
I called and spoke with pharmacist. She had not checked voicemails previously. She has rx and is getting it filled.

## 2018-06-04 NOTE — Telephone Encounter (Signed)
I called patient and advised. 

## 2018-06-04 NOTE — Telephone Encounter (Signed)
Called to pharmacy. I left voicemail for patient advising. °

## 2018-06-26 ENCOUNTER — Ambulatory Visit (INDEPENDENT_AMBULATORY_CARE_PROVIDER_SITE_OTHER): Payer: Medicare Other | Admitting: Orthopaedic Surgery

## 2018-06-26 ENCOUNTER — Other Ambulatory Visit: Payer: Self-pay

## 2018-06-26 ENCOUNTER — Encounter (INDEPENDENT_AMBULATORY_CARE_PROVIDER_SITE_OTHER): Payer: Self-pay | Admitting: Orthopaedic Surgery

## 2018-06-26 VITALS — Ht 73.0 in | Wt 172.0 lb

## 2018-06-26 DIAGNOSIS — Z9889 Other specified postprocedural states: Secondary | ICD-10-CM | POA: Insufficient documentation

## 2018-06-26 NOTE — Progress Notes (Signed)
   Post-Op Visit Note   Patient: Richard Middleton           Date of Birth: 04-28-1945           MRN: [id number] Visit Date: 06/26/2018 PCP: Martinique, Betty G, MD   Assessment & Plan: Closed L4-5 decompression microdiscectomy.  Good relief of leg pain.  Back incision looks good he was just taking 1 Ultram a day occasionally.  He is up with the surgical result and can gradually resume activities.  He will avoid shoveling or digging for about a month.  He can gradually resume his gardening work and then tennis.  Follow-up PRN.  Chief Complaint:  Chief Complaint  Patient presents with  . Lower Back - Follow-up    05/18/2018 L4-5 Decompression, Right Microdiscectomy   Visit Diagnoses:  1. Status post lumbar spine operative procedure for decompression of spinal cord     Plan: return PRN   Follow-Up Instructions: Return if symptoms worsen or fail to improve.   Orders:  No orders of the defined types were placed in this encounter.  No orders of the defined types were placed in this encounter.   Imaging: No results found.  PMFS History: Patient Active Problem List   Diagnosis Date Noted  . Status post lumbar spine operative procedure for decompression of spinal cord 06/26/2018  . Foot drop, right 05/05/2018  . ED (erectile dysfunction) 08/27/2017  . BPH associated with nocturia 08/27/2017  . Achilles tendon tear, right, subsequent encounter 05/14/2017  . Seborrheic keratoses 08/21/2016  . Small bowel obstruction ([place]) 07/11/2014  . Tendinitis of forearm 04/19/2013  . LIPOMA 06/29/2010  . TINEA BARBAE 01/22/2010  . SMALL BOWEL OBSTRUCTION, HX OF 02/19/2007   Past Medical History:  Diagnosis Date  . Abnormal glucose   . History of small bowel obstruction    x2  . Pneumonia    as a child    Family History  Problem Relation Age of Onset  . Arthritis Mother   . Pulmonary embolism Father   . Cancer Neg Hx   . Diabetes Neg Hx   . Colon cancer Neg Hx     Past Surgical History:   Procedure Laterality Date  . ACHILLES TENDON SURGERY Right 05/02/2017   Procedure: RIGHT ACHILLES RECONSTRUCTION;  Surgeon: Newt Minion, MD;  Location: [facility];  Service: Orthopedics;  Laterality: Right;  . COLONOSCOPY  2008  . LAPAROSCOPIC SMALL BOWEL RESECTION  3 1/2 years ago, & 10 years ago   x2  . LAPAROTOMY N/A 07/13/2014   Procedure: EXPLORATORY LAPAROTOMY lysis of adhesions;  Surgeon: Leighton Ruff, MD;  Location: [facility];  Service: General;  Laterality: N/A;  . LUMBAR LAMINECTOMY/DECOMPRESSION MICRODISCECTOMY N/A 05/18/2018   Procedure: L4-5 DECOMPRESSION, RIGHT MICRODISCECTOMY;  Surgeon: Marybelle Killings, MD;  Location: [facility];  Service: Orthopedics;  Laterality: N/A;   Social History   Occupational History  . Not on file  Tobacco Use  . Smoking status: Former Smoker    Packs/day: 4.00    Types: Cigarettes    Start date: 04/15/1966    Last attempt to quit: 04/15/1970    Years since quitting: 48.2  . Smokeless tobacco: Never Used  Substance and Sexual Activity  . Alcohol use: No  . Drug use: No  . Sexual activity: Not on file

## 2018-07-13 ENCOUNTER — Telehealth (INDEPENDENT_AMBULATORY_CARE_PROVIDER_SITE_OTHER): Payer: Self-pay | Admitting: Orthopaedic Surgery

## 2018-07-13 NOTE — Telephone Encounter (Signed)
Patient called stating that he is experiencing some swelling in the ankle that he had surgery on.  He may be doing too much as far as exercising, etc.  CB#[phone].  Thank you.

## 2018-07-13 NOTE — Telephone Encounter (Signed)
Pt had surgery a year ago. If he is having concerns we can see him in the office. Can you please call pt and make an appt for Erin Wednesday?

## 2018-07-13 NOTE — Telephone Encounter (Signed)
Please see below. Dr. Sharol Given did right achilles reconstruction on 05/02/2017.

## 2018-07-13 NOTE — Telephone Encounter (Signed)
Patient is scheduled Wednesday at 9:45am with Junie Panning

## 2018-07-14 ENCOUNTER — Telehealth (INDEPENDENT_AMBULATORY_CARE_PROVIDER_SITE_OTHER): Payer: Self-pay

## 2018-07-14 NOTE — Telephone Encounter (Signed)
Called pt and he answered not to all questions except he does have a cough. States that this is nothing more than normal allergies.

## 2018-07-15 ENCOUNTER — Ambulatory Visit (INDEPENDENT_AMBULATORY_CARE_PROVIDER_SITE_OTHER): Payer: Medicare Other | Admitting: Family

## 2018-10-05 ENCOUNTER — Emergency Department (HOSPITAL_COMMUNITY): Payer: Medicare Other

## 2018-10-05 ENCOUNTER — Encounter (HOSPITAL_COMMUNITY): Payer: Self-pay | Admitting: Radiology

## 2018-10-05 ENCOUNTER — Observation Stay (HOSPITAL_COMMUNITY): Payer: Medicare Other

## 2018-10-05 ENCOUNTER — Other Ambulatory Visit: Payer: Self-pay

## 2018-10-05 ENCOUNTER — Observation Stay (HOSPITAL_COMMUNITY)
Admission: EM | Admit: 2018-10-05 | Discharge: 2018-10-06 | Disposition: A | Discharge status: Home / Self Care | Payer: Medicare Other | Attending: Emergency Medicine | Admitting: Emergency Medicine

## 2018-10-05 DIAGNOSIS — Z03818 Encounter for observation for suspected exposure to other biological agents ruled out: Secondary | ICD-10-CM | POA: Diagnosis not present

## 2018-10-05 DIAGNOSIS — K56699 Other intestinal obstruction unspecified as to partial versus complete obstruction: Secondary | ICD-10-CM | POA: Diagnosis not present

## 2018-10-05 DIAGNOSIS — R112 Nausea with vomiting, unspecified: Secondary | ICD-10-CM | POA: Insufficient documentation

## 2018-10-05 DIAGNOSIS — Z87891 Personal history of nicotine dependence: Secondary | ICD-10-CM | POA: Diagnosis not present

## 2018-10-05 DIAGNOSIS — K56600 Partial intestinal obstruction, unspecified as to cause: Secondary | ICD-10-CM | POA: Insufficient documentation

## 2018-10-05 DIAGNOSIS — R1110 Vomiting, unspecified: Secondary | ICD-10-CM | POA: Diagnosis not present

## 2018-10-05 DIAGNOSIS — K831 Obstruction of bile duct: Secondary | ICD-10-CM | POA: Insufficient documentation

## 2018-10-05 DIAGNOSIS — R1030 Lower abdominal pain, unspecified: Secondary | ICD-10-CM | POA: Diagnosis not present

## 2018-10-05 DIAGNOSIS — Z1159 Encounter for screening for other viral diseases: Secondary | ICD-10-CM | POA: Diagnosis not present

## 2018-10-05 DIAGNOSIS — K56609 Unspecified intestinal obstruction, unspecified as to partial versus complete obstruction: Secondary | ICD-10-CM | POA: Diagnosis not present

## 2018-10-05 LAB — URINALYSIS, ROUTINE W REFLEX MICROSCOPIC
Bacteria, UA: NONE SEEN
Bilirubin Urine: NEGATIVE
Glucose, UA: NEGATIVE mg/dL
Hgb urine dipstick: NEGATIVE
Ketones, ur: 20 mg/dL — AB
Leukocytes,Ua: NEGATIVE
Nitrite: NEGATIVE
Protein, ur: 30 mg/dL — AB
Specific Gravity, Urine: 1.025 (ref 1.005–1.030)
pH: 8 (ref 5.0–8.0)

## 2018-10-05 LAB — CBC
HCT: 44.4 % (ref 39.0–52.0)
HCT: 41.2 % (ref 39.0–52.0)
Hemoglobin: 14.4 g/dL (ref 13.0–17.0)
Hemoglobin: 13 g/dL (ref 13.0–17.0)
MCH: 27.2 pg (ref 26.0–34.0)
MCH: 26.9 pg (ref 26.0–34.0)
MCHC: 32.4 g/dL (ref 30.0–36.0)
MCHC: 31.6 g/dL (ref 30.0–36.0)
MCV: 83.8 fL (ref 80.0–100.0)
MCV: 85.1 fL (ref 80.0–100.0)
Platelets: 192 10*3/uL (ref 150–400)
Platelets: 179 10*3/uL (ref 150–400)
RBC: 5.3 MIL/uL (ref 4.22–5.81)
RBC: 4.84 MIL/uL (ref 4.22–5.81)
RDW: 12.5 % (ref 11.5–15.5)
RDW: 12.6 % (ref 11.5–15.5)
WBC: 9.3 10*3/uL (ref 4.0–10.5)
WBC: 7.7 10*3/uL (ref 4.0–10.5)
nRBC: 0 % (ref 0.0–0.2)
nRBC: 0 % (ref 0.0–0.2)

## 2018-10-05 LAB — CREATININE, SERUM
Creatinine, Ser: 0.97 mg/dL (ref 0.61–1.24)
GFR calc Af Amer: >60 mL/min (ref >60)
GFR calc non Af Amer: >60 mL/min (ref >60)

## 2018-10-05 LAB — COMPREHENSIVE METABOLIC PANEL
ALT: 19 U/L (ref 0–44)
AST: 21 U/L (ref 15–41)
Albumin: 4.1 g/dL (ref 3.5–5.0)
Alkaline Phosphatase: 105 U/L (ref 38–126)
Anion gap: 11 (ref 5–15)
BUN: 13 mg/dL (ref 8–23)
CO2: 23 mmol/L (ref 22–32)
Calcium: 10 mg/dL (ref 8.9–10.3)
Chloride: 103 mmol/L (ref 98–111)
Creatinine, Ser: 1.01 mg/dL (ref 0.61–1.24)
GFR calc Af Amer: >60 mL/min (ref >60)
GFR calc non Af Amer: >60 mL/min (ref >60)
Glucose, Bld: 194 mg/dL — ABNORMAL HIGH (ref 70–99)
Potassium: 3.8 mmol/L (ref 3.5–5.1)
Sodium: 137 mmol/L (ref 135–145)
Total Bilirubin: 1.1 mg/dL (ref 0.3–1.2)
Total Protein: 7.5 g/dL (ref 6.5–8.1)

## 2018-10-05 LAB — SARS CORONAVIRUS 2 BY RT PCR (HOSPITAL ORDER, PERFORMED IN ~~LOC~~ HOSPITAL LAB): SARS Coronavirus 2: NEGATIVE

## 2018-10-05 LAB — LIPASE, BLOOD: Lipase: 32 U/L (ref 11–51)

## 2018-10-05 MED ORDER — DIATRIZOATE MEGLUMINE & SODIUM 66-10 % PO SOLN
90.0000 mL | Freq: Once | ORAL | Status: AC
  Administered 2018-10-05: 11:00:00 90 mL via ORAL
  Filled 2018-10-05: qty 90

## 2018-10-05 MED ORDER — IOHEXOL 300 MG/ML  SOLN
100.0000 mL | Freq: Once | INTRAMUSCULAR | Status: AC | PRN
  Administered 2018-10-05: 100 mL via INTRAVENOUS

## 2018-10-05 MED ORDER — POLYETHYLENE GLYCOL 3350 17 G PO PACK
17.0000 g | PACK | Freq: Every day | ORAL | Status: DC
  Administered 2018-10-06: 10:00:00 17 g via ORAL
  Filled 2018-10-05: qty 1

## 2018-10-05 MED ORDER — ONDANSETRON 4 MG PO TBDP: 4.0000 mg | ORAL_TABLET | Freq: Four times a day (QID) | ORAL | 0 refills | Status: DC | PRN

## 2018-10-05 MED ORDER — SODIUM CHLORIDE 0.9 % IV SOLN
INTRAVENOUS | Status: DC
  Administered 2018-10-05 (×2): via INTRAVENOUS

## 2018-10-05 MED ORDER — DOCUSATE SODIUM 100 MG PO CAPS
100.0000 mg | ORAL_CAPSULE | Freq: Two times a day (BID) | ORAL | Status: DC
  Administered 2018-10-05 – 2018-10-06 (×2): 100 mg via ORAL
  Filled 2018-10-05 (×2): qty 1

## 2018-10-05 MED ORDER — MORPHINE SULFATE (PF) 2 MG/ML IV SOLN: 1.0000 mg | INTRAVENOUS | Status: DC | PRN

## 2018-10-05 MED ORDER — METOPROLOL TARTRATE 5 MG/5ML IV SOLN: 5.0000 mg | Freq: Four times a day (QID) | INTRAVENOUS | Status: DC | PRN

## 2018-10-05 MED ORDER — ACETAMINOPHEN 325 MG PO TABS: 650.0000 mg | ORAL_TABLET | Freq: Four times a day (QID) | ORAL | Status: DC | PRN

## 2018-10-05 MED ORDER — ONDANSETRON 4 MG PO TBDP: 4.0000 mg | ORAL_TABLET | Freq: Four times a day (QID) | ORAL | Status: DC | PRN

## 2018-10-05 MED ORDER — ACETAMINOPHEN 650 MG RE SUPP: 650.0000 mg | Freq: Four times a day (QID) | RECTAL | Status: DC | PRN

## 2018-10-05 MED ORDER — DIPHENHYDRAMINE HCL 12.5 MG/5ML PO ELIX: 12.5000 mg | ORAL_SOLUTION | Freq: Four times a day (QID) | ORAL | Status: DC | PRN

## 2018-10-05 MED ORDER — SODIUM CHLORIDE 0.9 % IV BOLUS (SEPSIS)
1000.0000 mL | Freq: Once | INTRAVENOUS | Status: AC
  Administered 2018-10-05: 1000 mL via INTRAVENOUS

## 2018-10-05 MED ORDER — DIPHENHYDRAMINE HCL 50 MG/ML IJ SOLN: 12.5000 mg | Freq: Four times a day (QID) | INTRAMUSCULAR | Status: DC | PRN

## 2018-10-05 MED ORDER — ONDANSETRON HCL 4 MG/2ML IJ SOLN: 4.0000 mg | Freq: Four times a day (QID) | INTRAMUSCULAR | Status: DC | PRN

## 2018-10-05 MED ORDER — ENOXAPARIN SODIUM 40 MG/0.4ML ~~LOC~~ SOLN
40.0000 mg | SUBCUTANEOUS | Status: DC
  Administered 2018-10-05 – 2018-10-06 (×2): 40 mg via SUBCUTANEOUS
  Filled 2018-10-05 (×2): qty 0.4

## 2018-10-05 NOTE — ED Notes (Signed)
ED TO INPATIENT HANDOFF REPORT  ED Nurse Name and Phone #: mike rn   S Name/Age/Gender Richard Middleton 73 y.o. male Room/Bed: H80C/H023C  Code Status   Code Status: Full Code  Home/SNF/Other Home Patient oriented to: self, place, time and situation Is this baseline? Yes   Triage Complete: Triage complete  Chief Complaint N/V- ABD Pain   Triage Note Pt c/o abd pain that began yesterday 18:30 after eating. Has hx of bowel obx. Has vomited about 5-6 times with a couple of bowel movements.   Allergies No Known Allergies  Level of Care/Admitting Diagnosis ED Disposition    ED Disposition Condition [facility] Area: [place] [100100]  Level of Care: Med-Surg [16]  Covid Evaluation: Screening Protocol (No Symptoms)  Diagnosis: Partial small bowel obstruction [facility]) [[id number]]  Admitting Physician: [place], [place]  Attending Physician: CCS, MD [[id number]]  Bed request comments: 6N  PT Class (Do Not Modify): Observation [104]  PT Acc Code (Do Not Modify): Observation [10022]       B Medical/Surgery History Past Medical History:  Diagnosis Date  . Abnormal glucose   . History of small bowel obstruction    x2  . Pneumonia    as a child   Past Surgical History:  Procedure Laterality Date  . ACHILLES TENDON SURGERY Right 05/02/2017   Procedure: RIGHT ACHILLES RECONSTRUCTION;  Surgeon: Newt Minion, MD;  Location: [facility];  Service: Orthopedics;  Laterality: Right;  . COLONOSCOPY  2008  . LAPAROSCOPIC SMALL BOWEL RESECTION  3 1/2 years ago, & 10 years ago   x2  . LAPAROTOMY N/A 07/13/2014   Procedure: EXPLORATORY LAPAROTOMY lysis of adhesions;  Surgeon: Leighton Ruff, MD;  Location[facility];  Service: General;  Laterality: N/A;  . LUMBAR LAMINECTOMY/DECOMPRESSION MICRODISCECTOMY N/A 05/18/2018   Procedure: L4-5 DECOMPRESSION, RIGHT MICRODISCECTOMY;  Surgeon: Marybelle Killings, MD;  Location: [place];  Service: Orthopedics;  Laterality: N/A;      A IV Location/Drains/Wounds Patient Lines/Drains/Airways Status   Active Line/Drains/Airways    Name:   Placement date:   Placement time:   Site:   Days:   Peripheral IV 10/05/18 Right Forearm   10/05/18    0519    Forearm   less than 1   Incision (Closed) 07/13/14 Abdomen   07/13/14    1333     1545   Incision (Closed) 05/02/17 Leg Right   05/02/17    1319     521   Incision (Closed) 05/18/18 Back   05/18/18    1812     140          Intake/Output Last 24 hours No intake or output data in the 24 hours ending 10/05/18 1251  Labs/Imaging Results for orders placed or performed during the hospital encounter of 10/05/18 (from the past 48 hour(s))  Lipase, blood     Status: None   Collection Time: 10/05/18  3:59 AM  Result Value Ref Range   Lipase 32 11 - 51 U/L    Comment: Performed at [facility], [address], [place], [place] [address]  Comprehensive metabolic panel     Status: Abnormal   Collection Time: 10/05/18  3:59 AM  Result Value Ref Range   Sodium 137 135 - 145 mmol/L   Potassium 3.8 3.5 - 5.1 mmol/L   Chloride 103 98 - 111 mmol/L   CO2 23 22 - 32 mmol/L   Glucose, Bld 194 (H)  70 - 99 mg/dL   BUN 13 8 - 23 mg/dL   Creatinine, Ser 1.01 0.61 - 1.24 mg/dL   Calcium 10.0 8.9 - 10.3 mg/dL   Total Protein 7.5 6.5 - 8.1 g/dL   Albumin 4.1 3.5 - 5.0 g/dL   AST 21 15 - 41 U/L   ALT 19 0 - 44 U/L   Alkaline Phosphatase 105 38 - 126 U/L   Total Bilirubin 1.1 0.3 - 1.2 mg/dL   GFR calc non Af Amer >60 >60 mL/min   GFR calc Af Amer >60 >60 mL/min   Anion gap 11 5 - 15    Comment: Performed at [facility], [place] [address], [place] [address]  CBC     Status: None   Collection Time: 10/05/18  3:59 AM  Result Value Ref Range   WBC 9.3 4.0 - 10.5 K/uL   RBC 5.30 4.22 - 5.81 MIL/uL   Hemoglobin 14.4 13.0 - 17.0 g/dL   HCT 44.4 39.0 - 52.0 %   MCV 83.8 80.0 - 100.0 fL   MCH 27.2 26.0 - 34.0 pg   MCHC 32.4 30.0 - 36.0 g/dL   RDW 12.5 11.5 - 15.5 %    Platelets 192 150 - 400 K/uL   nRBC 0.0 0.0 - 0.2 %    Comment: Performed at [facility], [place] [address] [place], [place] [address]  Urinalysis, Routine w reflex microscopic     Status: Abnormal   Collection Time: 10/05/18  6:25 AM  Result Value Ref Range   Color, Urine YELLOW YELLOW   APPearance CLOUDY (A) CLEAR   Specific Gravity, Urine 1.025 1.005 - 1.030   pH 8.0 5.0 - 8.0   Glucose, UA NEGATIVE NEGATIVE mg/dL   Hgb urine dipstick NEGATIVE NEGATIVE   Bilirubin Urine NEGATIVE NEGATIVE   Ketones, ur 20 (A) NEGATIVE mg/dL   Protein, ur 30 (A) NEGATIVE mg/dL   Nitrite NEGATIVE NEGATIVE   Leukocytes,Ua NEGATIVE NEGATIVE   RBC / HPF 0-5 0 - 5 RBC/hpf   WBC, UA 0-5 0 - 5 WBC/hpf   Bacteria, UA NONE SEEN NONE SEEN   Squamous Epithelial / LPF 0-5 0 - 5   Mucus PRESENT     Comment: Performed at [facility], [place] [address], [place], [place] [address]  CBC     Status: None   Collection Time: 10/05/18  8:28 AM  Result Value Ref Range   WBC 7.7 4.0 - 10.5 K/uL   RBC 4.84 4.22 - 5.81 MIL/uL   Hemoglobin 13.0 13.0 - 17.0 g/dL   HCT 41.2 39.0 - 52.0 %   MCV 85.1 80.0 - 100.0 fL   MCH 26.9 26.0 - 34.0 pg   MCHC 31.6 30.0 - 36.0 g/dL   RDW 12.6 11.5 - 15.5 %   Platelets 179 150 - 400 K/uL   nRBC 0.0 0.0 - 0.2 %    Comment: Performed at [facility], [place] [address], [place], [place] [address]  Creatinine, serum     Status: None   Collection Time: 10/05/18  8:28 AM  Result Value Ref Range   Creatinine, Ser 0.97 0.61 - 1.24 mg/dL   GFR calc non Af Amer >60 >60 mL/min   GFR calc Af Amer >60 >60 mL/min    Comment: Performed at [place] [address], [place], [place] [address]  SARS Coronavirus 2 (CEPHEID - Performed in [facility] hospital lab), [facility]  Status: None   Collection Time: 10/05/18  9:06 AM   Specimen: Nasopharyngeal Swab  Result Value Ref Range   SARS Coronavirus 2 NEGATIVE NEGATIVE    Comment: (NOTE) If result is  NEGATIVE SARS-CoV-2 target nucleic acids are NOT DETECTED. The SARS-CoV-2 RNA is generally detectable in upper and lower  respiratory specimens during the acute phase of infection. The lowest  concentration of SARS-CoV-2 viral copies this assay can detect is 250  copies / mL. A negative result does not preclude SARS-CoV-2 infection  and should not be used as the sole basis for treatment or other  patient management decisions.  A negative result may occur with  improper specimen collection / handling, submission of specimen other  than nasopharyngeal swab, presence of viral mutation(s) within the  areas targeted by this assay, and inadequate number of viral copies  (<250 copies / mL). A negative result must be combined with clinical  observations, patient history, and epidemiological information. If result is POSITIVE SARS-CoV-2 target nucleic acids are DETECTED. The SARS-CoV-2 RNA is generally detectable in upper and lower  respiratory specimens dur ing the acute phase of infection.  Positive  results are indicative of active infection with SARS-CoV-2.  Clinical  correlation with patient history and other diagnostic information is  necessary to determine patient infection status.  Positive results do  not rule out bacterial infection or co-infection with other viruses. If result is PRESUMPTIVE POSTIVE SARS-CoV-2 nucleic acids MAY BE PRESENT.   A presumptive positive result was obtained on the submitted specimen  and confirmed on repeat testing.  While 2019 novel coronavirus  (SARS-CoV-2) nucleic acids may be present in the submitted sample  additional confirmatory testing may be necessary for epidemiological  and / or clinical management purposes  to differentiate between  SARS-CoV-2 and other Sarbecovirus currently known to infect humans.  If clinically indicated additional testing with an alternate test  methodology [id number]) is advised. The SARS-CoV-2 RNA is generally  detectable  in upper and lower respiratory sp ecimens during the acute  phase of infection. The expected result is Negative. Fact Sheet for Patients:  StrictlyIdeas.no Fact Sheet for Healthcare Providers: BankingDealers.co.za This test is not yet approved or cleared by the [place] FDA and has been authorized for detection and/or diagnosis of SARS-CoV-2 by FDA under an Emergency Use Authorization (EUA).  This EUA will remain in effect (meaning this test can be used) for the duration of the COVID-19 declaration under Section 564(b)(1) of the Act, 21 U.S.C. section 360bbb-3(b)(1), unless the authorization is terminated or revoked sooner. Performed at [facility], [place] [address], [place], [place] [address]    Ct Abdomen Pelvis W Contrast  Result Date: 10/05/2018 CLINICAL DATA:  Acute generalized abdominal pain.  Vomiting. EXAM: CT ABDOMEN AND PELVIS WITH CONTRAST TECHNIQUE: Multidetector CT imaging of the abdomen and pelvis was performed using the standard protocol following bolus administration of intravenous contrast. CONTRAST:  164mL OMNIPAQUE IOHEXOL 300 MG/ML  SOLN COMPARISON:  07/11/2014 FINDINGS: Lower chest:  No contributory findings. Hepatobiliary: Multiple cysts with simple appearanceno evidence of biliary obstruction or stone. Pancreas: Unremarkable. Spleen: Unremarkable. Adrenals/Urinary Tract: Negative adrenals. No hydronephrosis or stone. Unremarkable bladder. Stomach/Bowel: Proximal small bowel is distended with fluid levels and the terminal ileum is decompressed, implying a partial obstruction. A discrete obstructive process is not visualized. No evidence of mass or bowel ischemia. There is no significant emptying of the colon, fluid levels are seen proximally. No appendicitis. Vascular/Lymphatic: Atherosclerotic calcification. no mass or  adenopathy. Reproductive:Negative Other: No ascites or pneumoperitoneum. Musculoskeletal: Spinal  degeneration with scoliosis and lower lumbar laminectomy. IMPRESSION: Partial small bowel obstruction. Electronically Signed   By: Monte Fantasia M.D.   On: 10/05/2018 07:19    Pending Labs Unresulted Labs (From admission, onward)    Start     Ordered   10/12/18 0500  Creatinine, serum  (enoxaparin (LOVENOX)    CrCl >/= 30 ml/min)  Weekly,   R    Comments: while on enoxaparin therapy    10/05/18 0832   10/06/18 4665  Basic metabolic panel  Tomorrow morning,   R     10/05/18 0832   10/06/18 0500  CBC  Tomorrow morning,   R     10/05/18 0832          Vitals/Pain Today's Vitals   10/05/18 0615 10/05/18 0828 10/05/18 0910 10/05/18 1121  BP: 135/78  136/80 123/76  Pulse: 66  68 66  Resp: 16  18 15   Temp:      TempSrc:      SpO2: 97%  96% 100%  Weight:      Height:      PainSc:  0-No pain 3      Isolation Precautions No active isolations  Medications Medications  enoxaparin (LOVENOX) injection 40 mg (40 mg Subcutaneous Given 10/05/18 1100)  acetaminophen (TYLENOL) tablet 650 mg (has no administration in time range)    Or  acetaminophen (TYLENOL) suppository 650 mg (has no administration in time range)  morphine 2 MG/ML injection 1-2 mg (has no administration in time range)  diphenhydrAMINE (BENADRYL) 12.5 MG/5ML elixir 12.5 mg (has no administration in time range)    Or  diphenhydrAMINE (BENADRYL) injection 12.5 mg (has no administration in time range)  docusate sodium (COLACE) capsule 100 mg (has no administration in time range)  polyethylene glycol (MIRALAX / GLYCOLAX) packet 17 g (has no administration in time range)  ondansetron (ZOFRAN-ODT) disintegrating tablet 4 mg (has no administration in time range)    Or  ondansetron (ZOFRAN) injection 4 mg (has no administration in time range)  metoprolol tartrate (LOPRESSOR) injection 5 mg (has no administration in time range)  0.9 %  sodium chloride infusion ( Intravenous New Bag/Given 10/05/18 0905)  sodium chloride 0.9 %  bolus 1,000 mL (0 mLs Intravenous Stopped 10/05/18 0623)  iohexol (OMNIPAQUE) 300 MG/ML solution 100 mL (100 mLs Intravenous Contrast Given 10/05/18 0633)  diatrizoate meglumine-sodium (GASTROGRAFIN) 66-10 % solution 90 mL (90 mLs Oral Given 10/05/18 1128)    Mobility walks Low fall risk   Focused Assessments Pulmonary Assessment Handoff:  Lung sounds:   O2 Device: Room Air        R Recommendations: See Admitting Provider Note  Report given to:   Additional Notes: none

## 2018-10-05 NOTE — Plan of Care (Signed)
  Problem: Pain Managment: Goal: General experience of comfort will improve Outcome: Progressing   Problem: Safety: Goal: Ability to remain free from injury will improve Outcome: Progressing   Problem: Skin Integrity: Goal: Risk for impaired skin integrity will decrease Outcome: Progressing   

## 2018-10-05 NOTE — ED Provider Notes (Signed)
TIME SEEN: 5:08 AM  CHIEF COMPLAINT: Abdominal pain  HPI: Patient is a 73 year old male with history of 2 previous small bowel obstructions requiring surgical intervention, last time being in 2016 when he had lysis of adhesions who presents today with lower abdominal pain, multiple episodes of nausea and vomiting that started after eating dinner last night.  Reports he was able to have 2 normal bowel movements.  No bloody stools, melena, diarrhea.  No fevers, chills, cough, chest pain or shortness of breath.  States his wife does not have similar symptoms.  No recent travel, antibiotic use, sick contact.  Reports that he vomited just prior to arrival to the ED and states his symptoms have significantly improved.  ROS: See HPI Constitutional: no fever  Eyes: no drainage  ENT: no runny nose   Cardiovascular:  no chest pain  Resp: no SOB  GI: no vomiting GU: no dysuria Integumentary: no rash  Allergy: no hives  Musculoskeletal: no leg swelling  Neurological: no slurred speech ROS otherwise negative  PAST MEDICAL HISTORY/PAST SURGICAL HISTORY:  Past Medical History:  Diagnosis Date  . Abnormal glucose   . History of small bowel obstruction    x2  . Pneumonia    as a child    MEDICATIONS:  Prior to Admission medications   Medication Sig Start Date End Date Taking? Authorizing Provider  ibuprofen (ADVIL,MOTRIN) 600 MG tablet Take 600 mg by mouth daily as needed for moderate pain.    [provider]  methocarbamol (ROBAXIN) 500 MG tablet Take 1 tablet (500 mg total) by mouth every 6 (six) hours as needed for muscle spasms. 06/04/18   Marybelle Killings, MD  Misc Natural Products (TART CHERRY ADVANCED PO) Take 30 mLs by mouth daily. Mix in drink one daily    [provider]  oxyCODONE-acetaminophen (PERCOCET/ROXICET) 5-325 MG tablet Take 1-2 tablets by mouth every 4 (four) hours as needed for moderate pain or severe pain. Patient not taking: Reported on 06/26/2018 05/19/18    Marybelle Killings, MD  traMADol (ULTRAM) 50 MG tablet Take 1 tablet (50 mg total) by mouth every 6 (six) hours as needed. 06/04/18   Marybelle Killings, MD  VITAMIN D PO Take 1 capsule by mouth daily.    [provider]  Zoster Vaccine Adjuvanted [facility]) injection 0.5 ml in muscle and repeat in 8 weeks Patient not taking: Reported on 06/26/2018 01/23/18   [place], Betty G, MD    ALLERGIES:  No Known Allergies  SOCIAL HISTORY:  Social History   Tobacco Use  . Smoking status: Former Smoker    Packs/day: 4.00    Types: Cigarettes    Start date: 04/15/1966    Quit date: 04/15/1970    Years since quitting: 48.5  . Smokeless tobacco: Never Used  Substance Use Topics  . Alcohol use: No    FAMILY HISTORY: Family History  Problem Relation Age of Onset  . Arthritis Mother   . Pulmonary embolism Father   . Cancer Neg Hx   . Diabetes Neg Hx   . Colon cancer Neg Hx     EXAM: BP 112/78   Pulse 77   Temp 98.1 F (36.7 C) (Oral)   Resp 18   Ht 6\' 1"  (1.854 m)   Wt 78 kg   SpO2 98%   BMI 22.69 kg/m  CONSTITUTIONAL: Alert and oriented and responds appropriately to questions. Well-appearing; well-nourished early but appears younger than stated age HEAD: Normocephalic EYES: Conjunctivae clear, pupils appear  equal, EOMI ENT: normal nose; moist mucous membranes NECK: Supple, no meningismus, no nuchal rigidity, no LAD  CARD: RRR; S1 and S2 appreciated; no murmurs, no clicks, no rubs, no gallops RESP: Normal chest excursion without splinting or tachypnea; breath sounds clear and equal bilaterally; no wheezes, no rhonchi, no rales, no hypoxia or respiratory distress, speaking full sentences ABD/GI: Normal bowel sounds; non-distended; soft, minimally tender throughout the lower abdomen, no rebound, no guarding, no peritoneal signs, no hepatosplenomegaly BACK:  The back appears normal and is non-tender to palpation, there is no CVA tenderness EXT: Normal ROM in all joints; non-tender to  palpation; no edema; normal capillary refill; no cyanosis, no calf tenderness or swelling    SKIN: Normal color for age and race; warm; no rash NEURO: Moves all extremities equally PSYCH: The patient's mood and manner are appropriate. Grooming and personal hygiene are appropriate.  MEDICAL DECISION MAKING: Patient here with nausea, vomiting and abdominal pain.  He was concerned that he could have another bowel obstruction.  States symptoms have significantly improved on their own.  He is minimally tender on exam.  Will obtain CT imaging to evaluate for bowel obstruction, diverticulitis, colitis, appendicitis, cholecystitis.  His labs are unremarkable.  Urinalysis pending.  He declines pain or nausea medicine.  States he is concerned that he is "dehydrated".  Will give IV fluids.  ED PROGRESS: Labs, urine unremarkable.  CT abdomen pelvis pending.  Signed out to Dr. Francia Greaves.   I reviewed all nursing notes, vitals, pertinent previous records, EKGs, lab and urine results, imaging (as available).      Cristy Colmenares, Delice Bison, DO 10/05/18 [phone]

## 2018-10-05 NOTE — H&P (Signed)
[facility] Surgery Admission Note  BENTLEY FISSEL 1945-08-06  [id number].    Requesting MD: Francia Greaves Chief Complaint/Reason for Consult: psbo  HPI:  Patient is an otherwise healthy 73 year old male who presented to [facility] with lower abdominal pain, nausea and vomiting, and history of 2 prior sbo that required surgery. Last sbo was in 2016 and patient was treated with lysis of adhesions. Crampy pain and n/v started not long after dinner last night. He has had 2 BM that were normal for him and without blood or melena, but has since stopped passing flatus and threw up several times. Emesis was all undigested food, no coffee ground or frank blood. Patient denies fever, chills, chest pain, SOB, urinary symptoms. No close contacts with similar GI symptoms, no recent travel. While in ED symptoms have improved. Patient takes no daily medications and has NKDA. Occasional alcohol use. Denies tobacco or illicit drug use. Patient walks 2-3 miles per day. Patient very hopeful he will not need surgery this time.   ROS: Review of Systems  Constitutional: Negative for chills and fever.  Respiratory: Negative for shortness of breath.   Cardiovascular: Negative for chest pain, palpitations and leg swelling.  Gastrointestinal: Positive for abdominal pain, nausea and vomiting. Negative for blood in stool, diarrhea and melena.  Genitourinary: Negative for dysuria, frequency and urgency.  All other systems reviewed and are negative.   Family History  Problem Relation Age of Onset  . Arthritis Mother   . Pulmonary embolism Father   . Cancer Neg Hx   . Diabetes Neg Hx   . Colon cancer Neg Hx     Past Medical History:  Diagnosis Date  . Abnormal glucose   . History of small bowel obstruction    x2  . Pneumonia    as a child    Past Surgical History:  Procedure Laterality Date  . ACHILLES TENDON SURGERY Right 05/02/2017   Procedure: RIGHT ACHILLES RECONSTRUCTION;  Surgeon: Newt Minion, MD;   Location: [facility];  Service: Orthopedics;  Laterality: Right;  . COLONOSCOPY  2008  . LAPAROSCOPIC SMALL BOWEL RESECTION  3 1/2 years ago, & 10 years ago   x2  . LAPAROTOMY N/A 07/13/2014   Procedure: EXPLORATORY LAPAROTOMY lysis of adhesions;  Surgeon: Leighton Ruff, MD;  Location: [facility];  Service: General;  Laterality: N/A;  . LUMBAR LAMINECTOMY/DECOMPRESSION MICRODISCECTOMY N/A 05/18/2018   Procedure: L4-5 DECOMPRESSION, RIGHT MICRODISCECTOMY;  Surgeon: Marybelle Killings, MD;  Location: [facility];  Service: Orthopedics;  Laterality: N/A;    Social History:  reports that he quit smoking about 48 years ago. His smoking use included cigarettes. He started smoking about 52 years ago. He smoked 4.00 packs per day. He has never used smokeless tobacco. He reports that he does not drink alcohol or use drugs.  Allergies: No Known Allergies  (Not in a hospital admission)   Blood pressure 135/78, pulse 66, temperature 98.1 F (36.7 C), temperature source Oral, resp. rate 16, height 6\' 1"  (1.854 m), weight 78 kg, SpO2 97 %. Physical Exam: Physical Exam Constitutional:      General: He is not in acute distress. HENT:     Head: Normocephalic and atraumatic.     Right Ear: External ear normal.     Left Ear: External ear normal.  Eyes:     General: Lids are normal. No scleral icterus.    Extraocular Movements: Extraocular movements intact.     Conjunctiva/sclera: Conjunctivae normal.  Neck:  Musculoskeletal: Normal range of motion and neck supple.  Cardiovascular:     Rate and Rhythm: Normal rate and regular rhythm.     Pulses:          Radial pulses are 2+ on the right side and 2+ on the left side.       Popliteal pulses are 2+ on the right side and 2+ on the left side.     Heart sounds: Normal heart sounds.  Pulmonary:     Effort: Pulmonary effort is normal.     Breath sounds: Normal breath sounds.  Abdominal:     General: Bowel sounds are decreased. There is no distension.     Palpations:  Abdomen is soft.     Tenderness: There is no abdominal tenderness. There is no guarding or rebound.     Hernia: No hernia is present.     Comments: Midline surgical scar  Musculoskeletal:     Right lower leg: No edema.     Left lower leg: No edema.     Comments: ROM grossly intact in all 4 extremities  Skin:    General: Skin is warm and dry.  Neurological:     Mental Status: He is alert and oriented to person, place, and time.  Psychiatric:        Attention and Perception: Attention and perception normal.        Mood and Affect: Mood and affect normal.        Speech: Speech normal.        Behavior: Behavior is cooperative.     Results for orders placed or performed during the hospital encounter of 10/05/18 (from the past 48 hour(s))  Lipase, blood     Status: None   Collection Time: 10/05/18  3:59 AM  Result Value Ref Range   Lipase 32 11 - 51 U/L    Comment: Performed at [facility], [place] [address] [place], [place] [address]  Comprehensive metabolic panel     Status: Abnormal   Collection Time: 10/05/18  3:59 AM  Result Value Ref Range   Sodium 137 135 - 145 mmol/L   Potassium 3.8 3.5 - 5.1 mmol/L   Chloride 103 98 - 111 mmol/L   CO2 23 22 - 32 mmol/L   Glucose, Bld 194 (H) 70 - 99 mg/dL   BUN 13 8 - 23 mg/dL   Creatinine, Ser 1.01 0.61 - 1.24 mg/dL   Calcium 10.0 8.9 - 10.3 mg/dL   Total Protein 7.5 6.5 - 8.1 g/dL   Albumin 4.1 3.5 - 5.0 g/dL   AST 21 15 - 41 U/L   ALT 19 0 - 44 U/L   Alkaline Phosphatase 105 38 - 126 U/L   Total Bilirubin 1.1 0.3 - 1.2 mg/dL   GFR calc non Af Amer >60 >60 mL/min   GFR calc Af Amer >60 >60 mL/min   Anion gap 11 5 - 15    Comment: Performed at [facility], [place] [address], [place] [address]  CBC     Status: None   Collection Time: 10/05/18  3:59 AM  Result Value Ref Range   WBC 9.3 4.0 - 10.5 K/uL   RBC 5.30 4.22 - 5.81 MIL/uL   Hemoglobin 14.4 13.0 - 17.0 g/dL   HCT 44.4 39.0 - 52.0 %   MCV 83.8 80.0 -  100.0 fL   MCH 27.2 26.0 - 34.0 pg   MCHC 32.4 30.0 - 36.0 g/dL  RDW 12.5 11.5 - 15.5 %   Platelets 192 150 - 400 K/uL   nRBC 0.0 0.0 - 0.2 %    Comment: Performed at [facility], [place] [address] [place], [place] [address]  Urinalysis, Routine w reflex microscopic     Status: Abnormal   Collection Time: 10/05/18  6:25 AM  Result Value Ref Range   Color, Urine YELLOW YELLOW   APPearance CLOUDY (A) CLEAR   Specific Gravity, Urine 1.025 1.005 - 1.030   pH 8.0 5.0 - 8.0   Glucose, UA NEGATIVE NEGATIVE mg/dL   Hgb urine dipstick NEGATIVE NEGATIVE   Bilirubin Urine NEGATIVE NEGATIVE   Ketones, ur 20 (A) NEGATIVE mg/dL   Protein, ur 30 (A) NEGATIVE mg/dL   Nitrite NEGATIVE NEGATIVE   Leukocytes,Ua NEGATIVE NEGATIVE   RBC / HPF 0-5 0 - 5 RBC/hpf   WBC, UA 0-5 0 - 5 WBC/hpf   Bacteria, UA NONE SEEN NONE SEEN   Squamous Epithelial / LPF 0-5 0 - 5   Mucus PRESENT     Comment: Performed at [facility], [place] [address], [place],  [address]   Ct Abdomen Pelvis W Contrast  Result Date: 10/05/2018 CLINICAL DATA:  Acute generalized abdominal pain.  Vomiting. EXAM: CT ABDOMEN AND PELVIS WITH CONTRAST TECHNIQUE: Multidetector CT imaging of the abdomen and pelvis was performed using the standard protocol following bolus administration of intravenous contrast. CONTRAST:  129mL OMNIPAQUE IOHEXOL 300 MG/ML  SOLN COMPARISON:  07/11/2014 FINDINGS: Lower chest:  No contributory findings. Hepatobiliary: Multiple cysts with simple appearanceno evidence of biliary obstruction or stone. Pancreas: Unremarkable. Spleen: Unremarkable. Adrenals/Urinary Tract: Negative adrenals. No hydronephrosis or stone. Unremarkable bladder. Stomach/Bowel: Proximal small bowel is distended with fluid levels and the terminal ileum is decompressed, implying a partial obstruction. A discrete obstructive process is not visualized. No evidence of mass or bowel ischemia. There is no significant emptying of the colon,  fluid levels are seen proximally. No appendicitis. Vascular/Lymphatic: Atherosclerotic calcification. no mass or adenopathy. Reproductive:Negative Other: No ascites or pneumoperitoneum. Musculoskeletal: Spinal degeneration with scoliosis and lower lumbar laminectomy. IMPRESSION: Partial small bowel obstruction. Electronically Signed   By: Monte Fantasia M.D.   On: 10/05/2018 07:19      Assessment/Plan PSBO - CT scan shows distended proximal small bowel with fluid levels and decompressed terminal ileum, psbo - hold off on NGT for now as abdominal exam fairly benign and patient denies nausea currently, discussed that if patient starts vomiting he will need NGT placed - small bowel protocol with PO gastrografin - mobilize, ok to have some ice chips and sips with medications - admit for observation, ideally this will resolve fairly quickly without surgical intervention but if patient fails to improve or worsens may require operative intervention  FEN: NPO with ice chips, IVF VTE: SCDs, lovenox ID: no abx indicated  Brigid Re, [facility] Surgery 10/05/2018, 8:33 AM Pager: [phone] Consults: [phone]

## 2018-10-05 NOTE — ED Triage Notes (Signed)
Pt c/o abd pain that began yesterday 18:30 after eating. Has hx of bowel obx. Has vomited about 5-6 times with a couple of bowel movements.

## 2018-10-05 NOTE — ED Provider Notes (Signed)
Patient seen after signout from prior ED provider.  Patient is symptomatically improved at time of my evaluation.  CT AP imaging reveals possible partial small bowel obstruction.  Surgery has evaluated the patient and feels that an observation admission is appropriate.  Patient understands plan of care.   Valarie Merino, MD 10/05/18 [phone]

## 2018-10-06 DIAGNOSIS — K56600 Partial intestinal obstruction, unspecified as to cause: Secondary | ICD-10-CM | POA: Diagnosis not present

## 2018-10-06 LAB — CBC
HCT: 37.7 % — ABNORMAL LOW (ref 39.0–52.0)
Hemoglobin: 11.9 g/dL — ABNORMAL LOW (ref 13.0–17.0)
MCH: 27 pg (ref 26.0–34.0)
MCHC: 31.6 g/dL (ref 30.0–36.0)
MCV: 85.7 fL (ref 80.0–100.0)
Platelets: 172 10*3/uL (ref 150–400)
RBC: 4.4 MIL/uL (ref 4.22–5.81)
RDW: 12.8 % (ref 11.5–15.5)
WBC: 5.5 10*3/uL (ref 4.0–10.5)
nRBC: 0 % (ref 0.0–0.2)

## 2018-10-06 LAB — BASIC METABOLIC PANEL
Anion gap: 6 (ref 5–15)
BUN: 11 mg/dL (ref 8–23)
CO2: 24 mmol/L (ref 22–32)
Calcium: 8.7 mg/dL — ABNORMAL LOW (ref 8.9–10.3)
Chloride: 110 mmol/L (ref 98–111)
Creatinine, Ser: 1.07 mg/dL (ref 0.61–1.24)
GFR calc Af Amer: >60 mL/min (ref >60)
GFR calc non Af Amer: >60 mL/min (ref >60)
Glucose, Bld: 95 mg/dL (ref 70–99)
Potassium: 3.5 mmol/L (ref 3.5–5.1)
Sodium: 140 mmol/L (ref 135–145)

## 2018-10-06 NOTE — Progress Notes (Signed)
Patient discharged to home with instructions. 

## 2018-10-06 NOTE — Discharge Instructions (Signed)
Bowel Obstruction °A bowel obstruction means that something is blocking the small or large bowel. The bowel is also called the intestine. It is the long tube that connects the stomach to the opening of the butt (anus). When something blocks the bowel, food and fluids cannot pass through like normal. This condition needs to be treated. Treatment depends on the cause of the problem and how bad the problem is. °What are the causes? °Common causes of this condition include: °· Scar tissue (adhesions) from past surgery or from high-energy X-rays (radiation). °· Recent surgery in the belly. This affects how food moves in the bowel. °· Some diseases, such as: °? Irritation of the lining of the digestive tract (Crohn's disease). °? Irritation of small pouches in the bowel (diverticulitis). °· Growths or tumors. °· A bulging organ (hernia). °· Twisting of the bowel (volvulus). °· A foreign body. °· Slipping of a part of the bowel into another part (intussusception). °What are the signs or symptoms? °Symptoms of this condition include: °· Pain in the belly. °· Feeling sick to your stomach (nauseous). °· Throwing up (vomiting). °· Bloating in the belly. °· Being unable to pass gas. °· Trouble pooping (constipation). °· Watery poop (diarrhea). °· A lot of belching. °How is this diagnosed? °This condition may be diagnosed based on: °· A physical exam. °· Medical history. °· Imaging tests, such as X-ray or CT scan. °· Blood tests. °· Urine tests. °How is this treated? °Treatment for this condition may include: °· Fluids and pain medicines that are given through an IV tube. Your doctor may tell you not to eat or drink if you feel sick to your stomach and are throwing up. °· Eating a clear liquid diet for a few days. °· Putting a small tube (nasogastric tube) into the stomach. This will help with pain, discomfort, and nausea by removing blocked air and fluids from the stomach. °· Surgery. This may be needed if other treatments do  not work. °Follow these instructions at home: °Medicines °· Take over-the-counter and prescription medicines only as told by your doctor. °· If you were prescribed an antibiotic medicine, take it as told by your doctor. Do not stop taking the antibiotic even if you start to feel better. °General instructions °· Follow your diet as told by your doctor. You may need to: °? Only drink clear liquids until you start to get better. °? Avoid solid foods. °· Return to your normal activities as told by your doctor. Ask your doctor what activities are safe for you. °· Do not sit for a long time without moving. Get up to take short walks every 1-2 hours. This is important. Ask for help if you feel weak or unsteady. °· Keep all follow-up visits as told by your doctor. This is important. °How is this prevented? °After having a bowel obstruction, you may be more likely to have another. You can do some things to stop it from happening again. °· If you have a long-term (chronic) disease, contact your doctor if you see changes or problems. °· Take steps to prevent or treat trouble pooping. Your doctor may ask that you: °? Drink enough fluid to keep your pee (urine) pale yellow. °? Take over-the-counter or prescription medicines. °? Eat foods that are high in fiber. These include beans, whole grains, and fresh fruits and vegetables. °? Limit foods that are high in fat and sugar. These include fried or sweet foods. °· Stay active. Ask your doctor which exercises are   safe for you.  Avoid stress.  Eat three small meals and three small snacks each day.  Work with a Publishing rights manager (dietitian) to make a meal plan that works for you.  Do not use any products that contain nicotine or tobacco, such as cigarettes and e-cigarettes. If you need help quitting, ask your doctor. Contact a doctor if:  You have a fever.  You have chills. Get help right away if:  You have pain or cramps that get worse.  You throw up blood.  You are  sick to your stomach.  You cannot stop throwing up.  You cannot drink fluids.  You feel mixed up (confused).  You feel very thirsty (dehydrated).  Your belly gets more bloated.  You feel weak or you pass out (faint). Summary  A bowel obstruction means that something is blocking the small or large bowel.  Treatment may include IV fluids and pain medicine. You may also have a clear liquid diet, a small tube in your stomach, or surgery.  Drink clear liquids and avoid solid foods until you get better. This information is not intended to replace advice given to you by your health care provider. Make sure you discuss any questions you have with your health care provider. Document Released: 05/09/2004 Document Revised: 08/13/2017 Document Reviewed: 08/13/2017 Elsevier Interactive Patient Education  2019 [place] can stay on this for at least a week, be sure you are back to normal before adding fiber to your diet. Fiber is found in fruits, vegetables, whole grains, and beans. Eating a diet low in fiber helps to reduce how often you have bowel movements and how much you produce during a bowel movement. A low-fiber eating plan may help your digestive system heal if:  You have certain conditions, such as Crohn's disease or diverticulitis.  You recently had radiation therapy on your pelvis or bowel.  You recently had intestinal surgery.  You have a new surgical opening in your abdomen (colostomy or ileostomy).  Your intestine is narrowed (stricture). Your health care provider will determine how long you need to stay on this diet. Your health care provider may recommend that you work with a diet and nutrition specialist (dietitian). What are tips for following this plan? General guidelines  Follow recommendations from your dietitian about how much fiber you should have each day.  Most people on this eating plan should try to eat less than 10 grams  (g) of fiber each day. Your daily fiber goal is _________________ g.  Take vitamin and mineral supplements as told by your health care provider or dietitian. Chewable or liquid forms are best when on this eating plan. Reading food labels  Check food labels for the amount of dietary fiber.  Choose foods that have less than 2 grams of fiber in one serving. Cooking  Use white flour and other allowed grains for baking and cooking.  Cook meat using methods that keep it tender, such as braising or poaching.  Cook eggs until the yolk is completely solid.  Cook with healthy oils, such as olive oil or canola oil. Meal planning   Eat 5-6 small meals throughout the day instead of 3 large meals.  If you are lactose intolerant: ? Choose low-lactose dairy foods. ? Do not eat dairy foods, if told by your dietitian.  Limit fat and oils to less than 8 teaspoons a day.  Eat small portions of desserts. What foods are allowed? The items  listed below may not be a complete list. Talk with your dietitian about what dietary choices are best for you. Grains All bread and crackers made with white flour. Waffles, pancakes, and [place] toast. Bagels. Pretzels. Melba toast, zwieback, and matzoh. Cooked and dried cereals that do not contain whole grains, added fiber, seeds, or dried fruit. CornmealDomenick Gong. Hot and cold cereals made with refined corn, wheat, rice, or oats. Plain pasta and noodles. White rice. Vegetables Well-cooked or canned vegetables without skin, seeds, or stems. Cooked potatoes without skins. Vegetable juice. Fruits Soft-cooked or canned fruits without skin and seeds. Peeled ripe banana. Applesauce. Fruit juice without pulp. Meats and other protein foods Ground meat. Tender cuts of meat or poultry. Eggs. Fish, seafood, and shellfish. Smooth nut butters. Tofu. Dairy All milk products and drinks. Lactose-free milks, including rice, soy, and almond milks. Yogurt without fruit, nuts,  chocolate, or granola mix-ins. Sour cream. Cottage cheese. Cheese. Beverages Decaf coffee. Fruit and vegetable juices or smoothies (in small amounts, with no pulp or skins, and with fruits from allowed list). Sports drinks. Herbal tea. Fats and oils Olive oil, canola oil, sunflower oil, flaxseed oil, and grapeseed oil. Mayonnaise. Cream cheese. Margarine. Butter. Sweets and desserts Plain cakes and cookies. Cream pies and pies made with allowed fruits. Pudding. Custard. Fruit gelatin. Sherbet. Popsicles. Ice cream without nuts. Plain hard candy. Honey. Jelly. Molasses. Syrups, including chocolate syrup. Chocolate. Marshmallows. Gumdrops. Seasoning and other foods Bouillon. Broth. Cream soups made from allowed foods. Strained soup. Casseroles made with allowed foods. Ketchup. Mild mustard. Mild salad dressings. Plain gravies. Vinegar. Spices in moderation. Salt. Sugar. What foods are not allowed? The items listed below may not be a complete list. Talk with your dietitian about what dietary choices are best for you. Grains Whole wheat and whole grain breads and crackers. Multigrain breads and crackers. Rye bread. Whole grain or multigrain cereals. Cereals with nuts, raisins, or coconut. Bran. Coarse wheat cereals. Granola. High-fiber cereals. Cornmeal or corn bread. Whole grain pasta. Wild or brown rice. Quinoa. Popcorn. Buckwheat. Wheat germ. Vegetables Potato skins. Raw or undercooked vegetables. All beans and bean sprouts. Cooked greens. Corn. Peas. Cabbage. Beets. Broccoli. Brussels sprouts. Cauliflower. Mushrooms. Onions. Peppers. Parsnips. Okra. Sauerkraut. Fruit Raw or dried fruit. Berries. Fruit juice with pulp. Prune juice. Meats and other protein foods Tough, fibrous meats with gristle. Fatty meat. Poultry with skin. Fried meat, Sales executive, or fish. Deli or lunch meats. Sausage, bacon, and hot dogs. Nuts and chunky nut butter. Dried peas, beans, and lentils. Dairy Yogurt with fruit, nuts,  chocolate, or granola mix-ins. Beverages Caffeinated coffee and teas. Fats and oils Avocado. Coconut. Sweets and desserts Desserts, cookies, or candies that contain nuts or coconut. Dried fruit. Jams and preserves with seeds. Marmalade. Any dessert made with fruits or grains that are not allowed. Seasoning and other foods Corn tortilla chips. Soups made with vegetables or grains that are not allowed. Relish. Horseradish. Angie Fava. Olives. Summary  Most people on a low-fiber eating plan should eat less than 10 grams of fiber a day. Follow recommendations from your dietitian about how much fiber you should have each day.  Always check food labels to see the dietary fiber content of packaged foods. In general, a low-fiber food will have fewer than 2 grams of fiber per serving.  In general, try to avoid whole grains, raw fruits and vegetables, dried fruit, tough cuts of meat, nuts, and seeds.  Take a vitamin and mineral supplement as told by your health care  provider or dietitian. This information is not intended to replace advice given to you by your health care provider. Make sure you discuss any questions you have with your health care provider. Document Released: 09/21/2001 Document Revised: 06/04/2016 Document Reviewed: 06/04/2016 Elsevier Interactive Patient Education  2019 [place]. Irregular bowel habits such as constipation and diarrhea can lead to many problems over time.  Having one soft bowel movement a day is the most important way to prevent further problems.  The anorectal canal is designed to handle stretching and feces to safely manage our ability to get rid of solid waste (feces, poop, stool) out of our body.  BUT, hard constipated stools can act like ripping concrete bricks and diarrhea can be a burning fire to this very sensitive area of our body, causing inflamed hemorrhoids, anal fissures, increasing risk is perirectal abscesses, abdominal  pain/bloating, an making irritable bowel worse.     The goal: ONE SOFT BOWEL MOVEMENT A DAY!  To have soft, regular bowel movements:   Drink at least 8 tall glasses of water a day.    Take plenty of fiber.  Fiber is the undigested part of plant food that passes into the colon, acting s natures broom to encourage bowel motility and movement.  Fiber can absorb and hold large amounts of water. This results in a larger, bulkier stool, which is soft and easier to pass. Work gradually over several weeks up to 6 servings a day of fiber (25g a day even more if needed) in the form of: o Vegetables -- Root (potatoes, carrots, turnips), leafy green (lettuce, salad greens, celery, spinach), or cooked high residue (cabbage, broccoli, etc) o Fruit -- Fresh (unpeeled skin & pulp), Dried (prunes, apricots, cherries, etc ),  or stewed ( applesauce)  o Whole grain breads, pasta, etc (whole wheat)  o Bran cereals   Bulking Agents -- This type of water-retaining fiber generally is easily obtained each day by one of the following:  o Psyllium bran -- The psyllium plant is remarkable because its ground seeds can retain so much water. This product is available as Metamucil, Konsyl, Effersyllium, Per Diem Fiber, or the less expensive generic preparation in drug and health food stores. Although labeled a laxative, it really is not a laxative.  o Methylcellulose -- This is another fiber derived from wood which also retains water. It is available as Citrucel. o Polyethylene Glycol - and artificial fiber commonly called Miralax or Glycolax.  It is helpful for people with gassy or bloated feelings with regular fiber o Flax Seed - a less gassy fiber than psyllium  No reading or other relaxing activity while on the toilet. If bowel movements take longer than 5 minutes, you are too constipated  AVOID CONSTIPATION.  High fiber and water intake usually takes care of this.  Sometimes a laxative is needed to stimulate more  frequent bowel movements, but   Laxatives are not a good long-term solution as it can wear the colon out. o Osmotics (Milk of Magnesia, Fleets phosphosoda, Magnesium citrate, MiraLax, GoLytely) are safer than  o Stimulants (Senokot, Castor Oil, Dulcolax, Ex Lax)    o Do not take laxatives for more than 7days in a row.   IF SEVERELY CONSTIPATED, try a Bowel Retraining Program: o Do not use laxatives.  o Eat a diet high in roughage, such as bran cereals and leafy vegetables.  o Drink six (6) ounces of prune or apricot juice each  morning.  o Eat two (2) large servings of stewed fruit each day.  o Take one (1) heaping tablespoon of a psyllium-based bulking agent twice a day. Use sugar-free sweetener when possible to avoid excessive calories.  o Eat a normal breakfast.  o Set aside 15 minutes after breakfast to sit on the toilet, but do not strain to have a bowel movement.  o If you do not have a bowel movement by the third day, use an enema and repeat the above steps.   Controlling diarrhea o Switch to liquids and simpler foods for a few days to avoid stressing your intestines further. o Avoid dairy products (especially milk & ice cream) for a short time.  The intestines often can lose the ability to digest lactose when stressed. o Avoid foods that cause gassiness or bloating.  Typical foods include beans and other legumes, cabbage, broccoli, and dairy foods.  Every person has some sensitivity to other foods, so listen to our body and avoid those foods that trigger problems for you. o Adding fiber (Citrucel, Metamucil, psyllium, Miralax) gradually can help thicken stools by absorbing excess fluid and retrain the intestines to act more normally.  Slowly increase the dose over a few weeks.  Too much fiber too soon can backfire and cause cramping & bloating. o Probiotics (such as active yogurt, Align, etc) may help repopulate the intestines and colon with normal bacteria and calm down a sensitive  digestive tract.  Most studies show it to be of mild help, though, and such products can be costly. o Medicines: - Bismuth subsalicylate (ex. Kayopectate, Pepto Bismol) every 30 minutes for up to 6 doses can help control diarrhea.  Avoid if pregnant. - Loperamide (Immodium) can slow down diarrhea.  Start with two tablets (4mg  total) first and then try one tablet every 6 hours.  Avoid if you are having fevers or severe pain.  If you are not better or start feeling worse, stop all medicines and call your doctor for advice o Call your doctor if you are getting worse or not better.  Sometimes further testing (cultures, endoscopy, X-ray studies, bloodwork, etc) may be needed to help diagnose and treat the cause of the diarrhea.

## 2018-10-06 NOTE — Progress Notes (Addendum)
[facility] Progress Note     Subjective: CC: sbo Patient has had 4 BM since yesterday, passing flatus. Tolerating liquids and denies nausea or vomiting. Denies abdominal pain. Very interested in what he can do at home to try to prevent surgery with this.   Objective: Vital signs in last 24 hours: Temp:  [97.7 F (36.5 C)-98.7 F (37.1 C)] 98.2 F (36.8 C) (06/23 0538) Pulse Rate:  [55-72] 55 (06/23 0538) Resp:  [15-18] 17 (06/23 0538) BP: (109-136)/(76-86) 109/82 (06/23 0538) SpO2:  [96 %-100 %] 99 % (06/23 0538) Weight:  [78 kg] 78 kg (06/22 1300) Last BM Date: 10/05/18  Intake/Output from previous day: 06/22 0701 - 06/23 0700 In: 1471.2 [P.O.:120; I.V.:1351.2] Out: 3 [Stool:3] Intake/Output this shift: No intake/output data recorded.  PE: Gen:  Alert, NAD, pleasant Card:  Regular rate and rhythm Pulm:  Normal effort, clear to auscultation bilaterally Abd: Soft, non-tender, non-distended, +BS Skin: warm and dry, no rashes  Psych: A&Ox3   Lab Results:  Recent Labs    10/05/18 0828 10/06/18 0254  WBC 7.7 5.5  HGB 13.0 11.9*  HCT 41.2 37.7*  PLT 179 172   BMET Recent Labs    10/05/18 0359 10/05/18 0828 10/06/18 0254  NA 137  --  140  K 3.8  --  3.5  CL 103  --  110  CO2 23  --  24  GLUCOSE 194*  --  95  BUN 13  --  11  CREATININE 1.01 0.97 1.07  CALCIUM 10.0  --  8.7*   PT/INR No results for input(s): LABPROT, INR in the last 72 hours. CMP     Component Value Date/Time   NA 140 10/06/2018 0254   K 3.5 10/06/2018 0254   CL 110 10/06/2018 0254   CO2 24 10/06/2018 0254   GLUCOSE 95 10/06/2018 0254   BUN 11 10/06/2018 0254   CREATININE 1.07 10/06/2018 0254   CALCIUM 8.7 (L) 10/06/2018 0254   PROT 7.5 10/05/2018 0359   ALBUMIN 4.1 10/05/2018 0359   AST 21 10/05/2018 0359   ALT 19 10/05/2018 0359   ALKPHOS 105 10/05/2018 0359   BILITOT 1.1 10/05/2018 0359   GFRNONAA >60 10/06/2018 0254   GFRAA >60 10/06/2018 0254   Lipase      Component Value Date/Time   LIPASE 32 10/05/2018 0359       Studies/Results: Ct Abdomen Pelvis W Contrast  Result Date: 10/05/2018 CLINICAL DATA:  Acute generalized abdominal pain.  Vomiting. EXAM: CT ABDOMEN AND PELVIS WITH CONTRAST TECHNIQUE: Multidetector CT imaging of the abdomen and pelvis was performed using the standard protocol following bolus administration of intravenous contrast. CONTRAST:  122mL OMNIPAQUE IOHEXOL 300 MG/ML  SOLN COMPARISON:  07/11/2014 FINDINGS: Lower chest:  No contributory findings. Hepatobiliary: Multiple cysts with simple appearanceno evidence of biliary obstruction or stone. Pancreas: Unremarkable. Spleen: Unremarkable. Adrenals/Urinary Tract: Negative adrenals. No hydronephrosis or stone. Unremarkable bladder. Stomach/Bowel: Proximal small bowel is distended with fluid levels and the terminal ileum is decompressed, implying a partial obstruction. A discrete obstructive process is not visualized. No evidence of mass or bowel ischemia. There is no significant emptying of the colon, fluid levels are seen proximally. No appendicitis. Vascular/Lymphatic: Atherosclerotic calcification. no mass or adenopathy. Reproductive:Negative Other: No ascites or pneumoperitoneum. Musculoskeletal: Spinal degeneration with scoliosis and lower lumbar laminectomy. IMPRESSION: Partial small bowel obstruction. Electronically Signed   By: Monte Fantasia M.D.   On: 10/05/2018 07:19   Dg Abd Portable 1v-small Bowel Obstruction Protocol-initial, 8  Hr Delay  Result Date: 10/05/2018 CLINICAL DATA:  Small bowel obstruction. EXAM: PORTABLE ABDOMEN - 1 VIEW COMPARISON:  Abdominal CT earlier this day. FINDINGS: Persistent dilated small bowel in the central abdomen. There is contrast within the ascending and descending colon. No evidence of free air. Lung bases are clear. IMPRESSION: Persistent partial small bowel obstruction with distended small bowel in the central abdomen. There is contrast in  the ascending and descending colon indicating this is a partial obstruction rather than complete. Electronically Signed   By: Keith Rake M.D.   On: 10/05/2018 19:59    Anti-infectives: Anti-infectives (From admission, onward)   None       Assessment/Plan  PSBO - CT scan shows distended proximal small bowel with fluid levels and decompressed terminal ileum, psbo - small bowel protocol with PO gastrografin - film last night with persistent psbo - clinically patient improving, advance to soft diet - patient may be able to discharge this afternoon if tolerating soft diet  FEN: soft diet, IVF VTE: SCDs, lovenox ID: no abx indicated   LOS: 0 days    Brigid Re , [facility] 10/06/2018, 8:06 AM Pager: [place]: [phone]

## 2018-10-06 NOTE — Discharge Summary (Signed)
Physician Discharge Summary  Patient ID: Richard Middleton MRN: [id number] DOB/AGE: 16-Apr-1945 73 y.o.  Admit date: 10/05/2018 Discharge date: 10/06/2018  Admission Diagnoses:  Partial small bowel obstruction S/p SBR on 2 prior occasions  Discharge Diagnoses:  Same   Active Problems:   Partial small bowel obstruction [facility])   PROCEDURES: none  Hospital Course:  Patient is an otherwise healthy 73 year old male who presented to [facility] with lower abdominal pain, nausea and vomiting, and history of 2 prior sbo that required surgery. Last sbo was in 2016 and patient was treated with lysis of adhesions. Crampy pain and n/v started not long after dinner last night. He has had 2 BM that were normal for him and without blood or melena, but has since stopped passing flatus and threw up several times. Emesis was all undigested food, no coffee ground or frank blood. Patient denies fever, chills, chest pain, SOB, urinary symptoms. No close contacts with similar GI symptoms, no recent travel. While in ED symptoms have improved. Patient takes no daily medications and has NKDA. Occasional alcohol use. Denies tobacco or illicit drug use. Patient walks 2-3 miles per day. Patient very hopeful he will not need surgery this time.   He was admitted and we did not place an NG because his exam was fairly benign.  We placed him on the Small bowel protocol. He had 4 BM's by the following AM. He had contrast in his colon on the first 8 hour SBP film.  His diet was advanced, he had 2 more BM's after lunch, his abdomen was soft and he was ready for discharge by 4:30 PM.  We gave him instructions on good bowel health.  He can follow up with his PCP.  Condition on DC:  Improved  CBC Latest Ref Rng & Units 10/06/2018 10/05/2018 10/05/2018  WBC 4.0 - 10.5 K/uL 5.5 7.7 9.3  Hemoglobin 13.0 - 17.0 g/dL 11.9(L) 13.0 14.4  Hematocrit 39.0 - 52.0 % 37.7(L) 41.2 44.4  Platelets 150 - 400 K/uL 172 179 192   CMP Latest Ref Rng & Units  10/06/2018 10/05/2018 10/05/2018  Glucose 70 - 99 mg/dL 95 - 194(H)  BUN 8 - 23 mg/dL 11 - 13  Creatinine 0.61 - 1.24 mg/dL 1.07 0.97 1.01  Sodium 135 - 145 mmol/L 140 - 137  Potassium 3.5 - 5.1 mmol/L 3.5 - 3.8  Chloride 98 - 111 mmol/L 110 - 103  CO2 22 - 32 mmol/L 24 - 23  Calcium 8.9 - 10.3 mg/dL 8.7(L) - 10.0  Total Protein 6.5 - 8.1 g/dL - - 7.5  Total Bilirubin 0.3 - 1.2 mg/dL - - 1.1  Alkaline Phos 38 - 126 U/L - - 105  AST 15 - 41 U/L - - 21  ALT 0 - 44 U/L - - 19    Disposition: Discharge disposition: 01-Home or Self Care        Allergies as of 10/06/2018   No Known Allergies     Medication List    STOP taking these medications   methocarbamol 500 MG tablet Commonly known as: ROBAXIN   oxyCODONE-acetaminophen 5-325 MG tablet Commonly known as: PERCOCET/ROXICET   traMADol 50 MG tablet Commonly known as: ULTRAM   Zoster Vaccine Adjuvanted injection Commonly known as: Shingrix     TAKE these medications   ibuprofen 600 MG tablet Commonly known as: ADVIL Take 400 mg by mouth daily as needed for moderate pain.   ondansetron 4 MG disintegrating tablet Commonly known as: Zofran  ODT Take 1 tablet (4 mg total) by mouth every 6 (six) hours as needed.   VITAMIN D PO Take 500 mg by mouth daily.      Follow-up Information    Martinique, Betty G, MD. Call.   Specialty: Family Medicine Why: Call and schedule an appointment in 1-2 weeks for post-hospital visit.  Contact information: [place] [phone]           Signed: Earnstine Regal 10/06/2018, 4:36 PM

## 2018-10-07 ENCOUNTER — Telehealth: Payer: Self-pay | Admitting: *Deleted

## 2018-10-07 NOTE — Telephone Encounter (Signed)
Clinic RN attempted to contact patient on both numbers listed no answer. LVM for patient to return call

## 2018-10-08 NOTE — Telephone Encounter (Signed)
Transition Care Management Follow-up Telephone Call   Date discharged? 10/06/2018   How have you been since you were released from the hospital? "I've been doing okay"    Do you understand why you were in the hospital? yes   Do you understand the discharge instructions? yes   Where were you discharged to? Home    Items Reviewed:  Medications reviewed: yes  Allergies reviewed: yes  Dietary changes reviewed: yes   Referrals reviewed: N/A    Functional Questionnaire:   Activities of Daily Living (ADLs):   He states they are independent in the following: ambulation, bathing and hygiene, feeding, continence, grooming, toileting and dressing States they require assistance with the following: N/A    Any transportation issues/concerns?: no   Any patient concerns? Yes (wants to know what to do to prevent this from happening again)    Confirmed importance and date/time of follow-up visits scheduled yes   Provider Appointment booked with: Dr. Martinique   Confirmed with patient if condition begins to worsen call PCP or go to the ER.  Patient was given the office number and encouraged to call back with question or concerns.  : yes

## 2018-10-12 ENCOUNTER — Ambulatory Visit (INDEPENDENT_AMBULATORY_CARE_PROVIDER_SITE_OTHER): Payer: Medicare Other | Admitting: Family Medicine

## 2018-10-12 ENCOUNTER — Other Ambulatory Visit: Payer: Self-pay

## 2018-10-12 ENCOUNTER — Encounter: Payer: Self-pay | Admitting: Family Medicine

## 2018-10-12 DIAGNOSIS — K56600 Partial intestinal obstruction, unspecified as to cause: Secondary | ICD-10-CM | POA: Diagnosis not present

## 2018-10-12 DIAGNOSIS — D649 Anemia, unspecified: Secondary | ICD-10-CM | POA: Diagnosis not present

## 2018-10-12 NOTE — Progress Notes (Signed)
Virtual Visit via Video Note   I connected with Richard Middleton on 10/12/18 at  3:30 PM EDT by a video enabled telemedicine application and verified that I am speaking with the correct person using two identifiers.  Location patient: home Location provider:work office Persons participating in the virtual visit: patient, provider  I discussed the limitations of evaluation and management by telemedicine and the availability of in person appointments. The patient expressed understanding and agreed to proceed.   HPI: Richard Middleton is a 73 yo following on recent hospitalization. Admitted on 10/05/18 and discharged home on 10/06/18.  He presented to the ER on 10/05/18 because lower abdominal pain,nausea,and vomiting. Dx with partial small bowel obstruction. NG tube was not needed,symptoms improved while he was still in the ER.  Symptoms have resolved.  Negative abdominal pain, nausea, vomiting,blood stool,melena,or urinary symptoms. Passing flatus. Last bowel movement today at 11:30 am. Having about 2 bowel movements per day. Denies diarrhea and constipation. Mild fatigue, not quite back to his baseline,maybe 90%.  Appetite is good,wonders if he can start eating fruits and vegetables.  Mild anemia.  Lab Results  Component Value Date   WBC 5.5 10/06/2018   HGB 11.9 (L) 10/06/2018   HCT 37.7 (L) 10/06/2018   MCV 85.7 10/06/2018   PLT 172 10/06/2018   Lab Results  Component Value Date   CREATININE 1.07 10/06/2018   BUN 11 10/06/2018   NA 140 10/06/2018   K 3.5 10/06/2018   CL 110 10/06/2018   CO2 24 10/06/2018   Noted cough a couple times and rhinorrhea. He has not noted fever,chills,sore throat,wheezing,dyspnea,small or taste changes. No sick contact.  ROS: See pertinent positives and negatives per HPI.  Past Medical History:  Diagnosis Date  . Abnormal glucose   . History of small bowel obstruction    x2  . Pneumonia    as a child    Past Surgical History:  Procedure  Laterality Date  . ACHILLES TENDON SURGERY Right 05/02/2017   Procedure: RIGHT ACHILLES RECONSTRUCTION;  Surgeon: Newt Minion, MD;  Location: [place];  Service: Orthopedics;  Laterality: Right;  . COLONOSCOPY  2008  . LAPAROSCOPIC SMALL BOWEL RESECTION  3 1/2 years ago, & 10 years ago   x2  . LAPAROTOMY N/A 07/13/2014   Procedure: EXPLORATORY LAPAROTOMY lysis of adhesions;  Surgeon: Leighton Ruff, MD;  Location: [facility];  Service: General;  Laterality: N/A;  . LUMBAR LAMINECTOMY/DECOMPRESSION MICRODISCECTOMY N/A 05/18/2018   Procedure: L4-5 DECOMPRESSION, RIGHT MICRODISCECTOMY;  Surgeon: Marybelle Killings, MD;  Location: [place];  Service: Orthopedics;  Laterality: N/A;    Family History  Problem Relation Age of Onset  . Arthritis Mother   . Pulmonary embolism Father   . Cancer Neg Hx   . Diabetes Neg Hx   . Colon cancer Neg Hx     Social History   Socioeconomic History  . Marital status: Married    Spouse name: Not on file  . Number of children: Not on file  . Years of education: Not on file  . Highest education level: Not on file  Occupational History  . Not on file  Social Needs  . Financial resource strain: Not on file  . Food insecurity    Worry: Not on file    Inability: Not on file  . Transportation needs    Medical: Not on file    Non-medical: Not on file  Tobacco Use  . Smoking status: Former Smoker    Packs/day: 4.00  Types: Cigarettes    Start date: 04/15/1966    Quit date: 04/15/1970    Years since quitting: 48.5  . Smokeless tobacco: Never Used  Substance and Sexual Activity  . Alcohol use: No  . Drug use: No  . Sexual activity: Not on file  Lifestyle  . Physical activity    Days per week: Not on file    Minutes per session: Not on file  . Stress: Not on file  Relationships  . Social Herbalist on phone: Not on file    Gets together: Not on file    Attends religious service: Not on file    Active member of club or organization: Not on file     Attends meetings of clubs or organizations: Not on file    Relationship status: Not on file  . Intimate partner violence    Fear of current or ex partner: Not on file    Emotionally abused: Not on file    Physically abused: Not on file    Forced sexual activity: Not on file  Other Topics Concern  . Not on file  Social History Narrative  . Not on file      Current Outpatient Medications:  .  ibuprofen (ADVIL,MOTRIN) 600 MG tablet, Take 400 mg by mouth daily as needed for moderate pain. , Disp: , Rfl:  .  ondansetron (ZOFRAN ODT) 4 MG disintegrating tablet, Take 1 tablet (4 mg total) by mouth every 6 (six) hours as needed., Disp: 20 tablet, Rfl: 0 .  VITAMIN D PO, Take 500 mg by mouth daily. , Disp: , Rfl:   EXAM:  VITALS per patient if applicable:N/A  GENERAL: alert, oriented, appears well and in no acute distress  HEENT: atraumatic, normocephalic,conjunctiva clear, no obvious facial abnormalities on inspection. + Rhinorrhea.  NECK: normal movements of the head and neck  LUNGS: on inspection no signs of respiratory distress, breathing rate appears normal, no obvious gross SOB, gasping or wheezing  CV: no obvious cyanosis  MS: moves all visible extremities without noticeable abnormality  PSYCH/NEURO: pleasant and cooperative, no obvious depression or anxiety, speech and thought processing grossly intact  ASSESSMENT AND PLAN:  Richard Middleton was seen today for hospital follow up.  Discussed the following assessment and plan:  Partial small bowel obstruction ([place]) - Plan: Resolved. He can advance diet as tolerated. Adequate hydration. If constipation,recommend Miralax q 2 days. Instructed about warning signs.  Anemia, unspecified type - Plan: Mild. We will recheck next OV,11/2018.    I discussed the assessment and treatment plan with the patient. He was provided an opportunity to ask questions and all were answered. The patient agreed with the plan and demonstrated an  understanding of the instructions.     Return if symptoms worsen or fail to improve, for Has appt 11/20/18.Marland Kitchen    Trexton Escamilla Martinique, MD

## 2018-11-19 NOTE — Progress Notes (Signed)
HPI:  [name] is a 73 y.o.male here today for his routine physical examination.  Last CPE: 01/28/19 He lives with his wife and 27 yo son.  Regular exercise 3 or more times per week: Walking daily about 3-4 miles per day. Following a healthy diet: Yes.   Chronic medical problems: Hx of small bowel obstruction,s/p lumbar surgery for decompression of spinal cord,ED,  Hx of STD's: Negative.  Immunization History  Administered Date(s) Administered  . Influenza Split 01/24/2012  . Influenza Whole 01/12/2007, 01/05/2008, 02/01/2009, 01/09/2010  . Influenza, High Dose Seasonal PF 12/22/2015  . Influenza,inj,Quad PF,6+ Mos 01/11/2013  . Influenza-Unspecified 01/19/2018  . Pneumococcal Conjugate-13 08/18/2015  . Pneumococcal Polysaccharide-23 01/24/2012, 08/27/2017  . Td 08/01/2006  . Zoster 07/21/2007   -Hep C screening: 08/2016, NR   Last colon cancer screening: Colonoscopy in 10/2016. Last prostate ca screening:  Lab Results  Component Value Date   PSA 0.41 01/23/2018   PSA 0.39 08/27/2017   PSA 0.31 08/21/2016   Nocturia X 0-1,stable.  -Denies high alcohol intake or Hx of illicit drug use. Former smoker.  AAA screening: Abdominal CT negative for AA in 06/2014.  -Concerns and/or follow up today: Wt loss. States that he has lost about 10 Lb since hospitalization due to partial small bowel obstruction, 09/2018. Since discharged he has decreased meals portions and avoiding certain foods. He loss wt during hospitalization and a few weeks after but has been stable for 4-6 weeks.  Mild fatigue,he gets tired easier, this was noted after hospital discharge.  He denies fever,chills,night sweats,cough,dyspnea,abdominal pain,N/V,or urinary symptoms. FG has been elevated in ht epast.  Lab Results  Component Value Date   HGBA1C 5.7 04/22/2007   Lab Results  Component Value Date   CHOL 133 08/27/2017   HDL 57.60 08/27/2017   LDLCALC 55 08/27/2017   TRIG 100.0  08/27/2017   CHOLHDL 2 08/27/2017   Lab Results  Component Value Date   TSH 1.46 08/21/2016   Mild anemia during hospitalization. Lab Results  Component Value Date   WBC 5.5 10/06/2018   HGB 11.9 (L) 10/06/2018   HCT 37.7 (L) 10/06/2018   MCV 85.7 10/06/2018   PLT 172 10/06/2018     Review of Systems  Constitutional: Negative for activity change, appetite change and fever.  HENT: Negative for dental problem, nosebleeds, sore throat, trouble swallowing and voice change.   Eyes: Negative for redness and visual disturbance.  Respiratory: Negative for apnea, cough, shortness of breath and wheezing.   Cardiovascular: Negative for chest pain, palpitations and leg swelling.  Gastrointestinal: Negative for abdominal pain and blood in stool.  Endocrine: Negative for cold intolerance, polydipsia, polyphagia and polyuria.  Genitourinary: Negative for decreased urine volume, dysuria, genital sores, hematuria and testicular pain.  Musculoskeletal: Positive for back pain. Negative for gait problem and joint swelling.  Skin: Negative for color change and rash.  Neurological: Negative for dizziness, syncope, weakness and headaches.  Hematological: Negative for adenopathy. Does not bruise/bleed easily.  Psychiatric/Behavioral: Negative for confusion and sleep disturbance. The patient is nervous/anxious.   All other systems reviewed and are negative.   Current Outpatient Medications on File Prior to Visit  Medication Sig Dispense Refill  . VITAMIN D PO Take 500 mg by mouth daily.      No current facility-administered medications on file prior to visit.      Past Medical History:  Diagnosis Date  . Abnormal glucose   . History of small bowel obstruction  x2  . Pneumonia    as a child    Past Surgical History:  Procedure Laterality Date  . ACHILLES TENDON SURGERY Right 05/02/2017   Procedure: RIGHT ACHILLES RECONSTRUCTION;  Surgeon: Newt Minion, MD;  Location: [place];  Service:  Orthopedics;  Laterality: Right;  . COLONOSCOPY  2008  . LAPAROSCOPIC SMALL BOWEL RESECTION  3 1/2 years ago, & 10 years ago   x2  . LAPAROTOMY N/A 07/13/2014   Procedure: EXPLORATORY LAPAROTOMY lysis of adhesions;  Surgeon: Leighton Ruff, MD;  Location: [facility];  Service: General;  Laterality: N/A;  . LUMBAR LAMINECTOMY/DECOMPRESSION MICRODISCECTOMY N/A 05/18/2018   Procedure: L4-5 DECOMPRESSION, RIGHT MICRODISCECTOMY;  Surgeon: Marybelle Killings, MD;  Location: [place];  Service: Orthopedics;  Laterality: N/A;    No Known Allergies  Family History  Problem Relation Age of Onset  . Arthritis Mother   . Pulmonary embolism Father   . Cancer Neg Hx   . Diabetes Neg Hx   . Colon cancer Neg Hx     Social History   Socioeconomic History  . Marital status: Married    Spouse name: Not on file  . Number of children: Not on file  . Years of education: Not on file  . Highest education level: Not on file  Occupational History  . Not on file  Social Needs  . Financial resource strain: Not on file  . Food insecurity    Worry: Not on file    Inability: Not on file  . Transportation needs    Medical: Not on file    Non-medical: Not on file  Tobacco Use  . Smoking status: Former Smoker    Packs/day: 4.00    Types: Cigarettes    Start date: 04/15/1966    Quit date: 04/15/1970    Years since quitting: 48.6  . Smokeless tobacco: Never Used  Substance and Sexual Activity  . Alcohol use: No  . Drug use: No  . Sexual activity: Not on file  Lifestyle  . Physical activity    Days per week: Not on file    Minutes per session: Not on file  . Stress: Not on file  Relationships  . Social Herbalist on phone: Not on file    Gets together: Not on file    Attends religious service: Not on file    Active member of club or organization: Not on file    Attends meetings of clubs or organizations: Not on file    Relationship status: Not on file  Other Topics Concern  . Not on file  Social  History Narrative  . Not on file     Vitals:   11/20/18 0816  BP: 132/80  Pulse: 87  Resp: 12  Temp: 98.5 F (36.9 C)  SpO2: 97%   Body mass index is 21.13 kg/m.   Wt Readings from Last 3 Encounters:  11/20/18 160 lb 2 oz (72.6 kg)  10/05/18 171 lb 15.3 oz (78 kg)  06/26/18 172 lb (78 kg)    Physical Exam  Nursing note and vitals reviewed. Constitutional: He is oriented to person, place, and time. He appears well-developed and well-nourished. No distress.  HENT:  Head: Normocephalic and atraumatic.  Right Ear: Tympanic membrane, external ear and ear canal normal.  Left Ear: Tympanic membrane, external ear and ear canal normal.  Mouth/Throat: Oropharynx is clear and moist and mucous membranes are normal.  Eyes: Pupils are equal, round, and reactive to light. Conjunctivae  and EOM are normal.  Neck: Normal range of motion. No tracheal deviation present. No thyromegaly present.  Cardiovascular: Normal rate and regular rhythm.  No murmur heard. Pulses:      Dorsalis pedis pulses are 2+ on the right side and 2+ on the left side.  Respiratory: Effort normal and breath sounds normal. No respiratory distress.  GI: Soft. He exhibits no mass. There is no hepatomegaly. There is no abdominal tenderness.  Genitourinary:    Genitourinary Comments: Refused,no concerns.   Musculoskeletal:        General: No tenderness or edema.     Comments: No major deformities appreciated and no signs of synovitis.  Lymphadenopathy:    He has no cervical adenopathy.       Right: No supraclavicular adenopathy present.       Left: No supraclavicular adenopathy present.  Neurological: He is alert and oriented to person, place, and time. He has normal strength. No cranial nerve deficit or sensory deficit. Coordination and gait normal.  Reflex Scores:      Bicep reflexes are 2+ on the right side and 2+ on the left side.      Patellar reflexes are 2+ on the right side and 2+ on the left side. Skin:  Skin is warm. No erythema.  Psychiatric: He has a normal mood and affect. Cognition and memory are normal.  Well groomed,good eye contact.    ASSESSMENT AND PLAN:  Mr. Trinidad was seen today for annual exam.  Diagnoses and all orders for this visit: Orders Placed This Encounter  Procedures  . Comprehensive metabolic panel  . TSH  . Hemoglobin A1c  . CBC with Differential/Platelet  . PSA  . Urinalysis, Routine w reflex microscopic   Lab Results  Component Value Date   WBC 4.2 11/20/2018   HGB 13.7 11/20/2018   HCT 41.7 11/20/2018   MCV 83.2 11/20/2018   PLT 180.0 11/20/2018   Lab Results  Component Value Date   PSA 0.28 11/20/2018   Lab Results  Component Value Date   CREATININE 1.02 11/20/2018   BUN 16 11/20/2018   NA 139 11/20/2018   K 4.0 11/20/2018   CL 102 11/20/2018   CO2 29 11/20/2018   Lab Results  Component Value Date   ALT 14 11/20/2018   AST 17 11/20/2018   ALKPHOS 108 11/20/2018   BILITOT 0.7 11/20/2018   Lab Results  Component Value Date   HGBA1C 6.0 11/20/2018   Lab Results  Component Value Date   TSH 1.94 11/20/2018    Routine general medical examination at a health care facility We discussed the importance of regular physical activity and healthy diet for prevention of chronic illness and/or complications. Preventive guidelines reviewed. Vaccination up to date.  Interested in the new zoster vaccine, Shingrix Rx given. Fall prevention. Next CPE in a year.  BPH associated with nocturia Stable symptoms. Further recommendations will be given according to PSA results.  -     PSA -     Urinalysis, Routine w reflex microscopic  High glucose Continue healthy life style for primary prevention.  -     Hemoglobin A1c  Weight loss, non-intentional Most likely related to changes in his diet. Continue monitoring . F/U in 6 months.  -     Comprehensive metabolic panel -     TSH -     CBC with Differential/Platelet  Other orders -      Zoster Vaccine Adjuvanted [facility]) injection; 0.5 ml in muscle and  repeat in 8 weeks    Return in 6 months (on 05/23/2019) for follow up.    Elion Hocker G. Martinique, MD  [facility]. [place] office.

## 2018-11-20 ENCOUNTER — Other Ambulatory Visit: Payer: Self-pay

## 2018-11-20 ENCOUNTER — Ambulatory Visit (INDEPENDENT_AMBULATORY_CARE_PROVIDER_SITE_OTHER): Payer: Medicare Other | Admitting: Family Medicine

## 2018-11-20 ENCOUNTER — Encounter: Payer: Self-pay | Admitting: Family Medicine

## 2018-11-20 VITALS — BP 132/80 | HR 87 | Temp 98.5°F | Resp 12 | Ht 73.0 in | Wt 160.1 lb

## 2018-11-20 DIAGNOSIS — R7309 Other abnormal glucose: Secondary | ICD-10-CM

## 2018-11-20 DIAGNOSIS — N401 Enlarged prostate with lower urinary tract symptoms: Secondary | ICD-10-CM

## 2018-11-20 DIAGNOSIS — R634 Abnormal weight loss: Secondary | ICD-10-CM

## 2018-11-20 DIAGNOSIS — R351 Nocturia: Secondary | ICD-10-CM | POA: Diagnosis not present

## 2018-11-20 DIAGNOSIS — Z0000 Encounter for general adult medical examination without abnormal findings: Secondary | ICD-10-CM | POA: Diagnosis not present

## 2018-11-20 LAB — CBC WITH DIFFERENTIAL/PLATELET
Basophils Absolute: 0 10*3/uL (ref 0.0–0.1)
Basophils Relative: 0.3 % (ref 0.0–3.0)
Eosinophils Absolute: 0.1 10*3/uL (ref 0.0–0.7)
Eosinophils Relative: 1.5 % (ref 0.0–5.0)
HCT: 41.7 % (ref 39.0–52.0)
Hemoglobin: 13.7 g/dL (ref 13.0–17.0)
Lymphocytes Relative: 26.1 % (ref 12.0–46.0)
Lymphs Abs: 1.1 10*3/uL (ref 0.7–4.0)
MCHC: 32.9 g/dL (ref 30.0–36.0)
MCV: 83.2 fl (ref 78.0–100.0)
Monocytes Absolute: 0.3 10*3/uL (ref 0.1–1.0)
Monocytes Relative: 6.7 % (ref 3.0–12.0)
Neutro Abs: 2.8 10*3/uL (ref 1.4–7.7)
Neutrophils Relative %: 65.4 % (ref 43.0–77.0)
Platelets: 180 10*3/uL (ref 150.0–400.0)
RBC: 5.01 Mil/uL (ref 4.22–5.81)
RDW: 13.5 % (ref 11.5–15.5)
WBC: 4.2 10*3/uL (ref 4.0–10.5)

## 2018-11-20 LAB — COMPREHENSIVE METABOLIC PANEL
ALT: 14 U/L (ref 0–53)
AST: 17 U/L (ref 0–37)
Albumin: 4.3 g/dL (ref 3.5–5.2)
Alkaline Phosphatase: 108 U/L (ref 39–117)
BUN: 16 mg/dL (ref 6–23)
CO2: 29 mEq/L (ref 19–32)
Calcium: 9.8 mg/dL (ref 8.4–10.5)
Chloride: 102 mEq/L (ref 96–112)
Creatinine, Ser: 1.02 mg/dL (ref 0.40–1.50)
GFR: 86.81 mL/min (ref >60.00)
Glucose, Bld: 93 mg/dL (ref 70–99)
Potassium: 4 mEq/L (ref 3.5–5.1)
Sodium: 139 mEq/L (ref 135–145)
Total Bilirubin: 0.7 mg/dL (ref 0.2–1.2)
Total Protein: 7 g/dL (ref 6.0–8.3)

## 2018-11-20 LAB — URINALYSIS, ROUTINE W REFLEX MICROSCOPIC
Bilirubin Urine: NEGATIVE
Hgb urine dipstick: NEGATIVE
Ketones, ur: NEGATIVE
Leukocytes,Ua: NEGATIVE
Nitrite: NEGATIVE
Specific Gravity, Urine: 1.025 (ref 1.000–1.030)
Total Protein, Urine: NEGATIVE
Urine Glucose: NEGATIVE
Urobilinogen, UA: 0.2 (ref 0.0–1.0)
WBC, UA: NONE SEEN — AB (ref 0–?)
pH: 6.5 (ref 5.0–8.0)

## 2018-11-20 LAB — HEMOGLOBIN A1C: Hgb A1c MFr Bld: 6 % (ref 4.6–6.5)

## 2018-11-20 LAB — PSA: PSA: 0.28 ng/mL (ref 0.10–4.00)

## 2018-11-20 LAB — TSH: TSH: 1.94 u[IU]/mL (ref 0.35–4.50)

## 2018-11-20 MED ORDER — SHINGRIX 50 MCG/0.5ML IM SUSR: INTRAMUSCULAR | 1 refills | Status: DC

## 2018-11-20 NOTE — Patient Instructions (Signed)
A few things to remember from today's visit:   Routine general medical examination at a health care facility  BPH associated with nocturia  High glucose - Plan: Hemoglobin A1c  Weight loss, non-intentional - Plan: Comprehensive metabolic panel, TSH   At least 150 minutes of moderate exercise per week, daily brisk walking for 15-30 min is a good exercise option. Healthy diet low in saturated (animal) fats and sweets and consisting of fresh fruits and vegetables, lean meats such as fish and white chicken and whole grains.  - Vaccines:  Tdap vaccine every 10 years.  Shingles vaccine recommended at age 50, could be given after 72 years of age but not sure about insurance coverage.  Pneumonia vaccines:  Prevnar 71 at 37 and Pneumovax at 43.   -Screening recommendations for low/normal risk males:  Screening for diabetes at age 12 and every 3 years. Earlier screening if cardiovascular risk factors.   Lipid screening at 35 and every 3 years. Screening starts in younger males with cardiovascular risk factors.  Colon cancer screening at age 64 and until age 1.  Prostate cancer screening: some controversy, starts usually at 58: Rectal exam and PSA.  Aortic Abdominal Aneurism once between 52 and 62 years old if ever smoker.  Also recommended:  1. Dental visit- Brush and floss your teeth twice daily; visit your dentist twice a year. 2. Eye doctor- Get an eye exam at least every 2 years. 3. Helmet use- Always wear a helmet when riding a bicycle, motorcycle, rollerblading or skateboarding. 4. Safe sex- If you may be exposed to sexually transmitted infections, use a condom. 5. Seat belts- Seat belts can save your live; always wear one. 6. Smoke/Carbon Monoxide detectors- These detectors need to be installed on the appropriate level of your home. Replace batteries at least once a year. 7. Skin cancer- When out in the sun please cover up and use sunscreen 15 SPF or higher. 8. Violence-  If anyone is threatening or hurting you, please tell your healthcare provider.  9. Drink alcohol in moderation- Limit alcohol intake to one drink or less per day. Never drink and drive. Please be sure medication list is accurate. If a new problem present, please set up appointment sooner than planned today.

## 2018-11-22 ENCOUNTER — Encounter: Payer: Self-pay | Admitting: Family Medicine

## 2019-04-06 ENCOUNTER — Ambulatory Visit (INDEPENDENT_AMBULATORY_CARE_PROVIDER_SITE_OTHER): Payer: Medicare Other

## 2019-04-06 VITALS — Ht 73.0 in | Wt 170.0 lb

## 2019-04-06 DIAGNOSIS — Z0000 Encounter for general adult medical examination without abnormal findings: Secondary | ICD-10-CM

## 2019-04-06 NOTE — Progress Notes (Signed)
This visit is being conducted via phone call due to the COVID-19 pandemic. This patient has given me verbal consent via phone to conduct this visit, patient states they are participating from their home address. Some vital signs may be absent or patient reported.   Patient identification: identified by name, DOB, and current address.  Location provider: [facility], Office Persons participating in the virtual visit: Richard Middleton and Franne Forts, LPN.     Subjective:   Richard Middleton is a 73 y.o. male who presents for Medicare Annual/Subsequent preventive examination.  Richard Middleton reports he is walking 2-3 miles almost every day. He states he is eating lean meats, fresh fruits, fresh vegetables, and drinks 6 glasses of water daily. We discussed increasing the intensity of his walking and adding light weights for strengthening.  Review of Systems:   Cardiac Risk Factors include: advanced age (>36men, >63 women);male gender     Objective:    Vitals: Ht 6\' 1"  (1.854 m)   Wt 170 lb (77.1 kg)   BMI 22.43 kg/m   Body mass index is 22.43 kg/m.  Advanced Directives 04/06/2019 10/05/2018 10/05/2018 05/18/2018 05/02/2017 09/30/2016 08/21/2016  Does Patient Have a Medical Advance Directive? Yes No No No No No No  Does patient want to make changes to medical advance directive? No - Patient declined - - - - - -  Would patient like information on creating a medical advance directive? - No - Patient declined - No - Patient declined No - Patient declined - Yes (MAU/Ambulatory/Procedural Areas - Information given)    Tobacco Social History   Tobacco Use  Smoking Status Former Smoker  . Packs/day: 4.00  . Types: Cigarettes  . Start date: 04/15/1966  . Quit date: 04/15/1970  . Years since quitting: 49.0  Smokeless Tobacco Never Used     Counseling given: Not Answered   Clinical Intake:  Pre-visit preparation completed: Yes  Pain : No/denies pain     Nutritional Status: BMI of 19-24   Normal Nutritional Risks: None Diabetes: No  How often do you need to have someone help you when you read instructions, pamphlets, or other written materials from your doctor or pharmacy?: 1 - Never What is the last grade level you completed in school?: technical college degree  Interpreter Needed?: No  Information entered by :: Franne Forts, LPN.  Past Medical History:  Diagnosis Date  . Abnormal glucose   . History of small bowel obstruction    x2  . Pneumonia    as a child   Past Surgical History:  Procedure Laterality Date  . ACHILLES TENDON SURGERY Right 05/02/2017   Procedure: RIGHT ACHILLES RECONSTRUCTION;  Surgeon: Newt Minion, MD;  Location: [facility];  Service: Orthopedics;  Laterality: Right;  . COLONOSCOPY  2008  . LAPAROSCOPIC SMALL BOWEL RESECTION  3 1/2 years ago, & 10 years ago   x2  . LAPAROTOMY N/A 07/13/2014   Procedure: EXPLORATORY LAPAROTOMY lysis of adhesions;  Surgeon: Leighton Ruff, MD;  Location: [facility];  Service: General;  Laterality: N/A;  . LUMBAR LAMINECTOMY/DECOMPRESSION MICRODISCECTOMY N/A 05/18/2018   Procedure: L4-5 DECOMPRESSION, RIGHT MICRODISCECTOMY;  Surgeon: Marybelle Killings, MD;  Location: [facility];  Service: Orthopedics;  Laterality: N/A;   Family History  Problem Relation Age of Onset  . Arthritis Mother   . Pulmonary embolism Father   . Cancer Neg Hx   . Diabetes Neg Hx   . Colon cancer Neg Hx    Social History  Socioeconomic History  . Marital status: Married    Spouse name: Not on file  . Number of children: 1  . Years of education: 6  . Highest education level: Associate degree: occupational, Hotel manager, or vocational program  Occupational History  . Occupation: retired  Tobacco Use  . Smoking status: Former Smoker    Packs/day: 4.00    Types: Cigarettes    Start date: 04/15/1966    Quit date: 04/15/1970    Years since quitting: 49.0  . Smokeless tobacco: Never Used  Substance and Sexual Activity  . Alcohol use: Yes     Alcohol/week: 1.0 standard drinks    Types: 1 Glasses of wine per week    Comment: 2-3 times per month   . Drug use: No  . Sexual activity: Not on file  Other Topics Concern  . Not on file  Social History Narrative  . Not on file   Social Determinants of Health   Financial Resource Strain:   . Difficulty of Paying Living Expenses: Not on file  Food Insecurity:   . Worried About Charity fundraiser in the Last Year: Not on file  . Ran Out of Food in the Last Year: Not on file  Transportation Needs:   . Lack of Transportation (Medical): Not on file  . Lack of Transportation (Non-Medical): Not on file  Physical Activity:   . Days of Exercise per Week: Not on file  . Minutes of Exercise per Session: Not on file  Stress:   . Feeling of Stress : Not on file  Social Connections:   . Frequency of Communication with Friends and Family: Not on file  . Frequency of Social Gatherings with Friends and Family: Not on file  . Attends Religious Services: Not on file  . Active Member of Clubs or Organizations: Not on file  . Attends Archivist Meetings: Not on file  . Marital Status: Not on file    Outpatient Encounter Medications as of 04/06/2019  Medication Sig  . VITAMIN D PO Take 500 mg by mouth daily.   Marland Kitchen Zoster Vaccine Adjuvanted [facility]) injection 0.5 ml in muscle and repeat in 8 weeks (Patient not taking: Reported on 04/06/2019)   No facility-administered encounter medications on file as of 04/06/2019.    Activities of Daily Living In your present state of health, do you have any difficulty performing the following activities: 04/06/2019 10/05/2018  Hearing? N N  Vision? N N  Difficulty concentrating or making decisions? N N  Walking or climbing stairs? N N  Dressing or bathing? N N  Doing errands, shopping? N N  Preparing Food and eating ? N -  Using the Toilet? N -  In the past six months, have you accidently leaked urine? N -  Do you have problems with loss  of bowel control? N -  Managing your Medications? N -  Managing your Finances? N -  Housekeeping or managing your Housekeeping? N -  Some recent data might be hidden    Patient Care Team: Martinique, Betty G, MD as PCP - General (Family Medicine)   Assessment:   This is a routine wellness examination for Richard Middleton.  Exercise Activities and Dietary recommendations Current Exercise Habits: Home exercise routine, Type of exercise: walking, Intensity: Moderate, Exercise limited by: None identified  Goals    . Increase physical activity     By increasing intensity and adding light weights while walking.       Fall Risk Fall  Risk  01/23/2018 08/27/2017 12/22/2015 07/01/2014 02/19/2013  Falls in the past year? No No No No No   Is the patient's home free of loose throw rugs in walkways, pet beds, electrical cords, etc?   yes      Grab bars in the bathroom? yes      Handrails on the stairs?   yes      Adequate lighting?   yes  Timed Get Up and Go Performed: N/A due to telephone visit.  Depression Screen PHQ 2/9 Scores 04/06/2019 01/23/2018 08/27/2017 12/22/2015  PHQ - 2 Score 1 0 0 0    Cognitive Function MMSE - Mini Mental State Exam 12/22/2015  Not completed: (No Data)     6CIT Screen 04/06/2019  What month? 0 points  What time? 0 points  Count back from 20 0 points  Months in reverse 0 points  Repeat phrase 0 points    Immunization History  Administered Date(s) Administered  . Influenza Split 01/24/2012  . Influenza Whole 01/12/2007, 01/05/2008, 02/01/2009, 01/09/2010  . Influenza, High Dose Seasonal PF 12/22/2015, 01/19/2018  . Influenza,inj,Quad PF,6+ Mos 01/11/2013  . Influenza-Unspecified 01/19/2018, 01/20/2019  . Pneumococcal Conjugate-13 08/18/2015  . Pneumococcal Polysaccharide-23 01/24/2012, 08/27/2017  . Td 08/01/2006  . Zoster 07/21/2007  . Zoster Recombinat (Shingrix) 01/20/2019    Qualifies for Shingles Vaccine? Yes, he thinks he has received both shingrix  vaccines but will verify with his pharmacy to be sure. He will obtain 2nd vaccine if necessary to complete.  Screening Tests Health Maintenance  Topic Date Due  . TETANUS/TDAP  01/23/2021 (Originally 07/31/2016)  . COLONOSCOPY  10/31/2021  . INFLUENZA VACCINE  Completed  . Hepatitis C Screening  Completed  . PNA vac Low Risk Adult  Completed   Cancer Screenings: Lung: Low Dose CT Chest recommended if Age 47-80 years, 30 pack-year currently smoking OR have quit w/in 15years. Patient does not qualify. Colorectal: Yes, up to date currently.  Additional Screenings:  Hepatitis C Screening: completed 01/23/2018.      Plan:   Mr. Copus is doing a great job with exercise and eating healthy. He will try to increase intensity of walking and add weights for strengthening. He will also follow up with his pharmacy to be sure he received shingrix x 2.   I have personally reviewed and noted the following in the patient's chart:   . Medical and social history . Use of alcohol, tobacco or illicit drugs  . Current medications and supplements . Functional ability and status . Nutritional status . Physical activity . Advanced directives . List of other physicians . Hospitalizations, surgeries, and ER visits in previous 12 months . Vitals . Screenings to include cognitive, depression, and falls . Referrals and appointments  In addition, I have reviewed and discussed with patient certain preventive protocols, quality metrics, and best practice recommendations. A written personalized care plan for preventive services as well as general preventive health recommendations were provided to patient.     Franne Forts, LPN  [id number]

## 2019-04-06 NOTE — Patient Instructions (Signed)
Richard Middleton , Thank you for taking time to participate in your Medicare Wellness Visit. I appreciate your ongoing commitment to your health goals. Please review the following plan we discussed and let me know if I can assist you in the future.   Screening recommendations/referrals: Colorectal Screening: up to date; last completed 10/31/2016; repeat in 5 years from last colonoscopy.  Vision and Dental Exams: Recommended annual ophthalmology exams for early detection of glaucoma and other disorders of the eye Recommended annual dental exams for proper oral hygiene. Patient states he is behind with annual eye exam and dentist due to covid pandemic.  Diabetic Exams: Diabetic Eye Exam: N/A  Diabetic Foot Exam: N/A  Vaccinations: Influenza vaccine: completed 01/20/2019; due again Fall 2021.  Pneumococcal vaccine: completed 08/27/2017 & 08/18/2015. Up to date.  Tdap vaccine: completed 07/31/2016. Due again 08/01/2026. Shingles vaccine: Completed Shingrix #1 on 01/20/2019. Patient thinks he completed both Shingrix vaccines and will check with pharmacy. Advanced directives: Advance directives discussed with you today.  Advance directives discussed with you today. You have declined to receive documents for completion because you have the documents already. Please bring a copy of your POA (Power of [place]) and/or Living Will to your next appointment.  Goals: Continue to drink at least 6-8 8oz glasses of water per day. Continue to exercise for at least 150 minutes per week. Try to increase intensity of exercise and add weights while walking.   Next appointment: Please schedule your Annual Wellness Visit with your Nurse Health Advisor in one year.  Preventive Care 72 Years and Older, Male Preventive care refers to lifestyle choices and visits with your health care provider that can promote health and wellness. What does preventive care include?  A yearly physical exam. This is also called an annual  well check.  Dental exams once or twice a year.  Routine eye exams. Ask your health care provider how often you should have your eyes checked.  Personal lifestyle choices, including:  Daily care of your teeth and gums.  Regular physical activity.  Eating a healthy diet.  Avoiding tobacco and drug use.  Limiting alcohol use.  Practicing safe sex.  Taking low doses of aspirin every day if recommended by your health care provider..  Taking vitamin and mineral supplements as recommended by your health care provider. What happens during an annual well check? The services and screenings done by your health care provider during your annual well check will depend on your age, overall health, lifestyle risk factors, and family history of disease. Counseling  Your health care provider may ask you questions about your:  Alcohol use.  Tobacco use.  Drug use.  Emotional well-being.  Home and relationship well-being.  Sexual activity.  Eating habits.  History of falls.  Memory and ability to understand (cognition).  Work and work Statistician. Screening  You may have the following tests or measurements:  Height, weight, and BMI.  Blood pressure.  Lipid and cholesterol levels. These may be checked every 5 years, or more frequently if you are over 33 years old.  Skin check.  Lung cancer screening. You may have this screening every year starting at age 72 if you have a 30-pack-year history of smoking and currently smoke or have quit within the past 15 years.  Fecal occult blood test (FOBT) of the stool. You may have this test every year starting at age 72.  Flexible sigmoidoscopy or colonoscopy. You may have a sigmoidoscopy every 5 years or a colonoscopy every  10 years starting at age 72.  Prostate cancer screening. Recommendations will vary depending on your family history and other risks.  Hepatitis C blood test.  Hepatitis B blood test.  Sexually transmitted  disease (STD) testing.  Diabetes screening. This is done by checking your blood sugar (glucose) after you have not eaten for a while (fasting). You may have this done every 1-3 years.  Abdominal aortic aneurysm (AAA) screening. You may need this if you are a current or former smoker.  Osteoporosis. You may be screened starting at age 72 if you are at high risk. Talk with your health care provider about your test results, treatment options, and if necessary, the need for more tests. Vaccines  Your health care provider may recommend certain vaccines, such as:  Influenza vaccine. This is recommended every year.  Tetanus, diphtheria, and acellular pertussis (Tdap, Td) vaccine. You may need a Td booster every 10 years.  Zoster vaccine. You may need this after age 72.  Pneumococcal 13-valent conjugate (PCV13) vaccine. One dose is recommended after age 72.  Pneumococcal polysaccharide (PPSV23) vaccine. One dose is recommended after age 72. Talk to your health care provider about which screenings and vaccines you need and how often you need them. This information is not intended to replace advice given to you by your health care provider. Make sure you discuss any questions you have with your health care provider. Document Released: 04/28/2015 Document Revised: 12/20/2015 Document Reviewed: 01/31/2015 Elsevier Interactive Patient Education  2017 [place] Prevention in the Home Falls can cause injuries. They can happen to people of all ages. There are many things you can do to make your home safe and to help prevent falls. What can I do on the outside of my home?  Regularly fix the edges of walkways and driveways and fix any cracks.  Remove anything that might make you trip as you walk through a door, such as a raised step or threshold.  Trim any bushes or trees on the path to your home.  Use bright outdoor lighting.  Clear any walking paths of anything that might make someone  trip, such as rocks or tools.  Regularly check to see if handrails are loose or broken. Make sure that both sides of any steps have handrails.  Any raised decks and porches should have guardrails on the edges.  Have any leaves, snow, or ice cleared regularly.  Use sand or salt on walking paths during winter.  Clean up any spills in your garage right away. This includes oil or grease spills. What can I do in the bathroom?  Use night lights.  Install grab bars by the toilet and in the tub and shower. Do not use towel bars as grab bars.  Use non-skid mats or decals in the tub or shower.  If you need to sit down in the shower, use a plastic, non-slip stool.  Keep the floor dry. Clean up any water that spills on the floor as soon as it happens.  Remove soap buildup in the tub or shower regularly.  Attach bath mats securely with double-sided non-slip rug tape.  Do not have throw rugs and other things on the floor that can make you trip. What can I do in the bedroom?  Use night lights.  Make sure that you have a light by your bed that is easy to reach.  Do not use any sheets or blankets that are too big for your bed. They should not  hang down onto the floor.  Have a firm chair that has side arms. You can use this for support while you get dressed.  Do not have throw rugs and other things on the floor that can make you trip. What can I do in the kitchen?  Clean up any spills right away.  Avoid walking on wet floors.  Keep items that you use a lot in easy-to-reach places.  If you need to reach something above you, use a strong step stool that has a grab bar.  Keep electrical cords out of the way.  Do not use floor polish or wax that makes floors slippery. If you must use wax, use non-skid floor wax.  Do not have throw rugs and other things on the floor that can make you trip. What can I do with my stairs?  Do not leave any items on the stairs.  Make sure that there  are handrails on both sides of the stairs and use them. Fix handrails that are broken or loose. Make sure that handrails are as long as the stairways.  Check any carpeting to make sure that it is firmly attached to the stairs. Fix any carpet that is loose or worn.  Avoid having throw rugs at the top or bottom of the stairs. If you do have throw rugs, attach them to the floor with carpet tape.  Make sure that you have a light switch at the top of the stairs and the bottom of the stairs. If you do not have them, ask someone to add them for you. What else can I do to help prevent falls?  Wear shoes that:  Do not have high heels.  Have rubber bottoms.  Are comfortable and fit you well.  Are closed at the toe. Do not wear sandals.  If you use a stepladder:  Make sure that it is fully opened. Do not climb a closed stepladder.  Make sure that both sides of the stepladder are locked into place.  Ask someone to hold it for you, if possible.  Clearly mark and make sure that you can see:  Any grab bars or handrails.  First and last steps.  Where the edge of each step is.  Use tools that help you move around (mobility aids) if they are needed. These include:  Canes.  Walkers.  Scooters.  Crutches.  Turn on the lights when you go into a dark area. Replace any light bulbs as soon as they burn out.  Set up your furniture so you have a clear path. Avoid moving your furniture around.  If any of your floors are uneven, fix them.  If there are any pets around you, be aware of where they are.  Review your medicines with your doctor. Some medicines can make you feel dizzy. This can increase your chance of falling. Ask your doctor what other things that you can do to help prevent falls. This information is not intended to replace advice given to you by your health care provider. Make sure you discuss any questions you have with your health care provider. Document Released:  01/26/2009 Document Revised: 09/07/2015 Document Reviewed: 05/06/2014 Elsevier Interactive Patient Education  2017 [employer].

## 2019-04-30 IMAGING — CR DG LUMBAR SPINE 2-3V
2 series · 2 of 2 positions shown · non-contrast
Comparison: MRI May 03, 2018

CLINICAL DATA: Localization for surgery.

EXAM:
LUMBAR SPINE - 2-3 VIEW

[xtable lateral (1 of 2)]
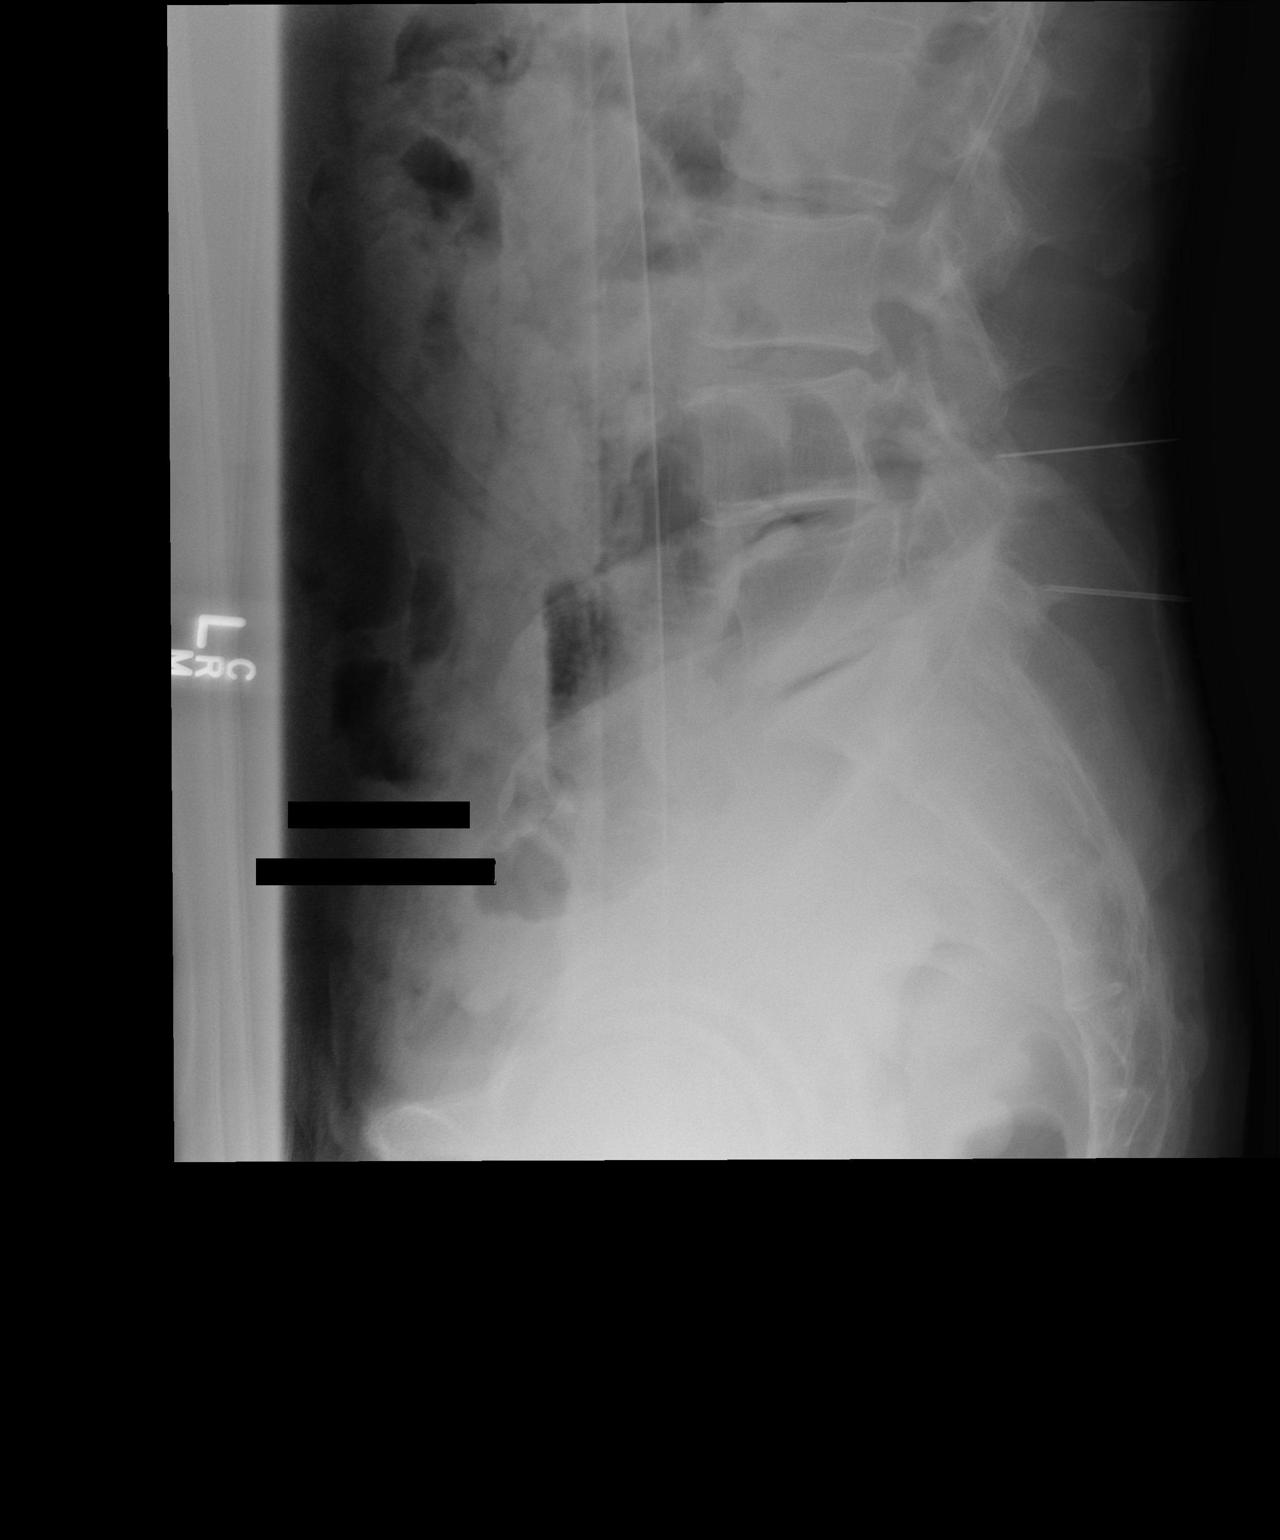

[xtable lateral (2 of 2)]
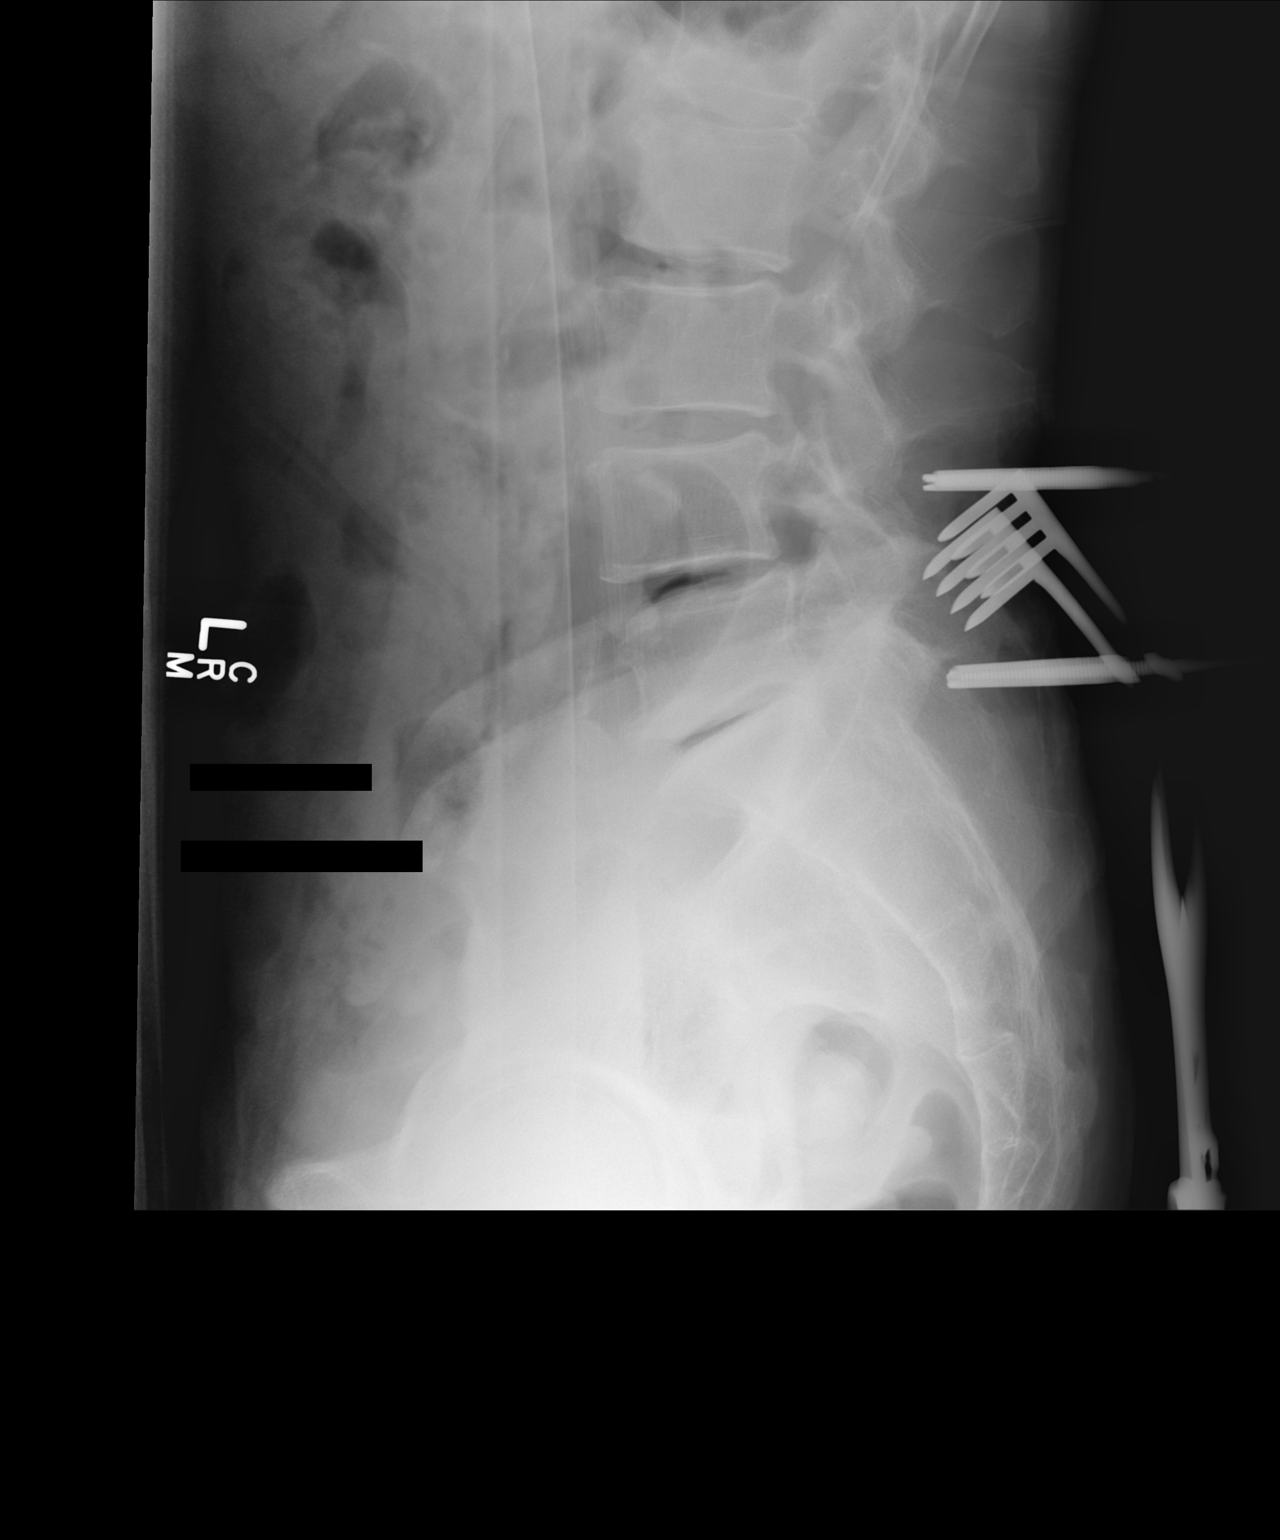

[2 of 2 positions shown; findings below may reference images not displayed]

FINDINGS: Surgical markers are seen posterior to L4 and L5. Degenerative
changes at L5-S1.
IMPRESSION: Surgical markers are seen posterior to L4 and L5.

## 2019-05-15 ENCOUNTER — Ambulatory Visit: Payer: Medicare Other

## 2019-05-20 ENCOUNTER — Ambulatory Visit: Payer: Medicare Other

## 2019-05-20 IMAGING — US US ABDOMINAL AORTA SCREENING AAA
1 series · 14 of 24 positions shown · non-contrast
Comparison: None.

CLINICAL DATA: Male between 65-75 years of age with a smoking
history.

EXAM:
US ABDOMINAL AORTA MEDICARE SCREENING
TECHNIQUE: Ultrasound examination of the abdominal aorta was performed as a
screening evaluation for abdominal aortic aneurysm.

[Series 1: us abdominal aorta screening aaa · 0.25mm/px · 14 of 24 slices shown]
[im 1/24]
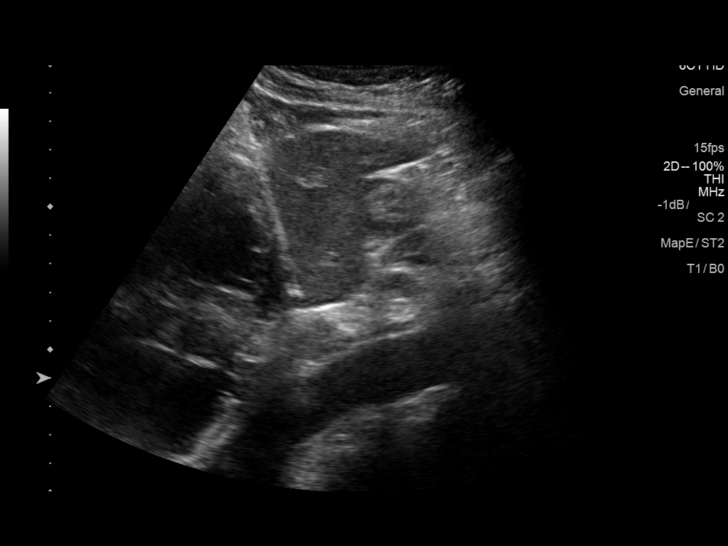
[im 3/24]
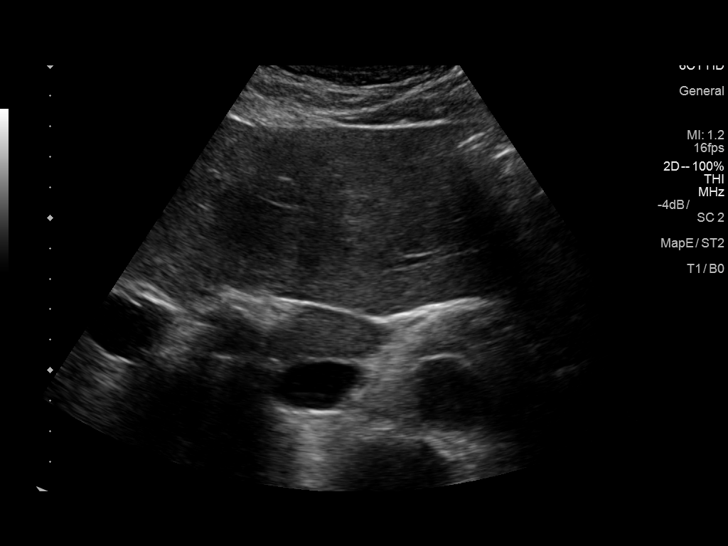
[im 5/24]
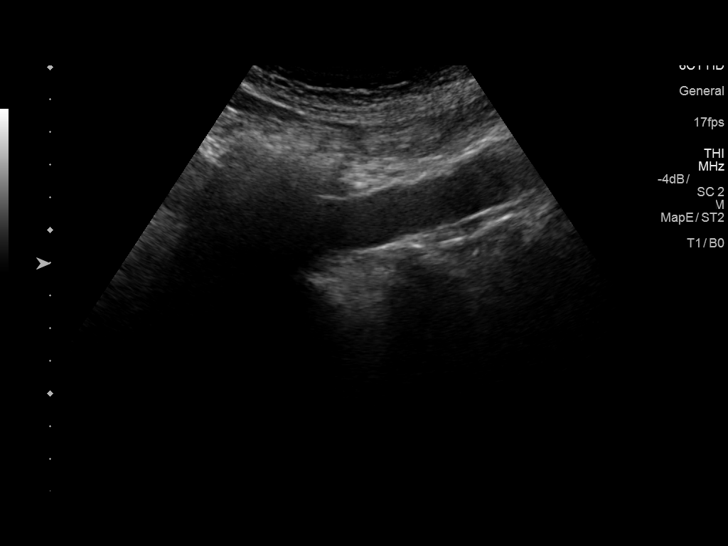
[im 7/24]
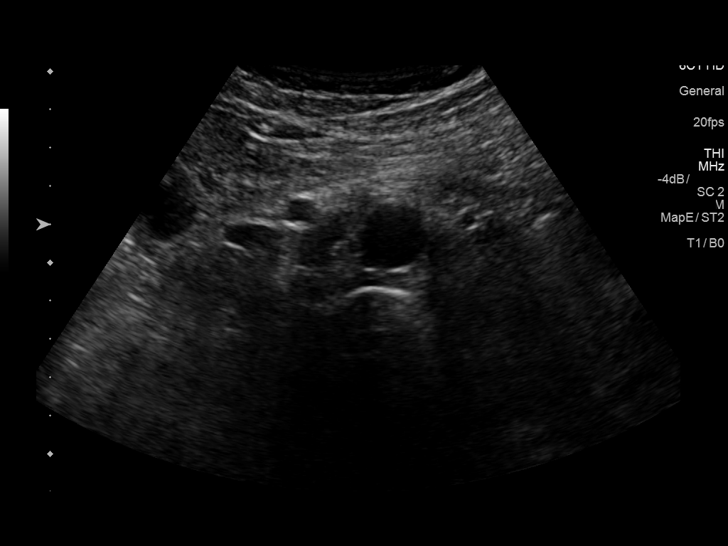
[im 8/24]
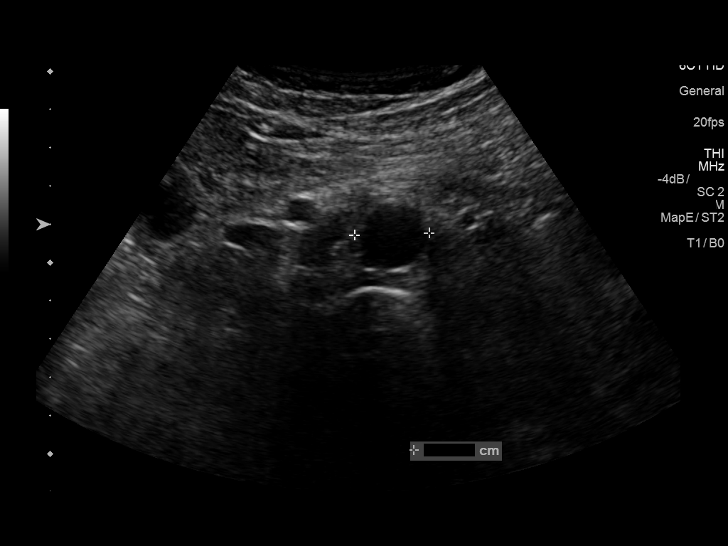
[im 10/24]
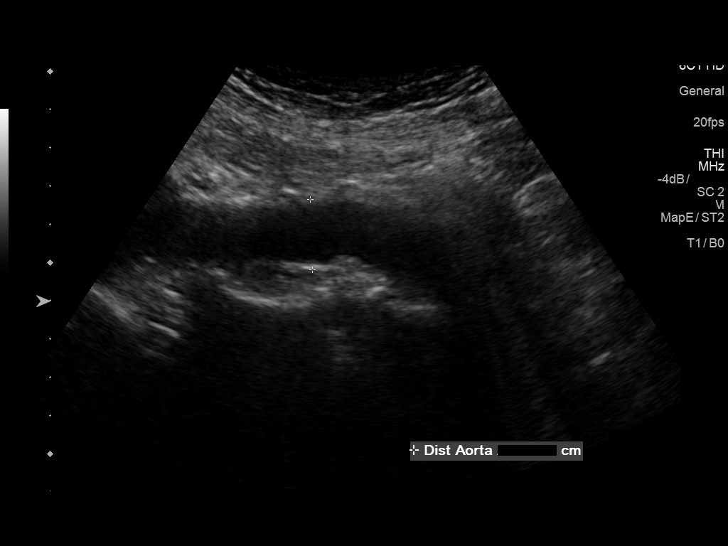
[im 12/24]
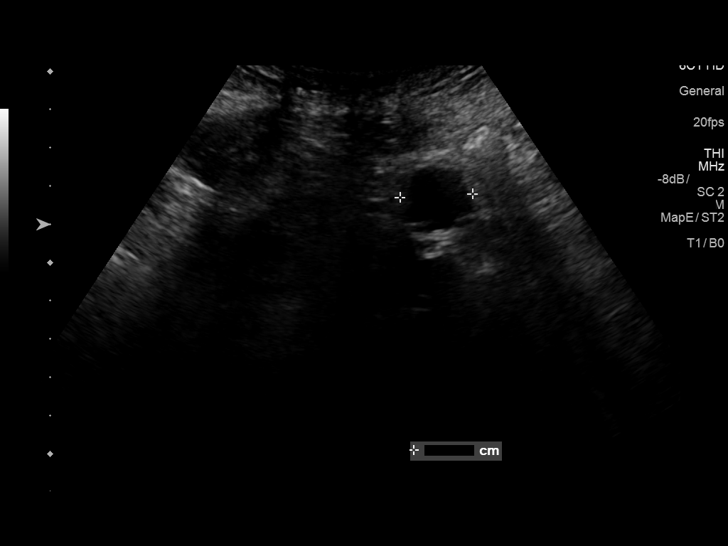
[im 13/24]
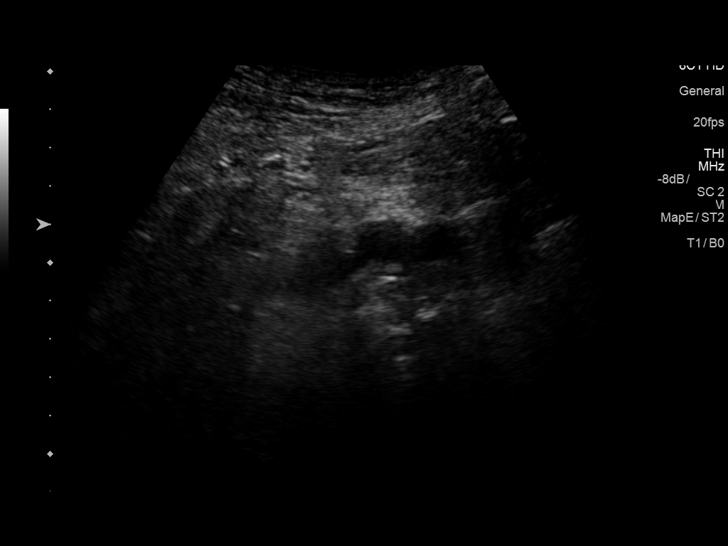
[im 15/24]
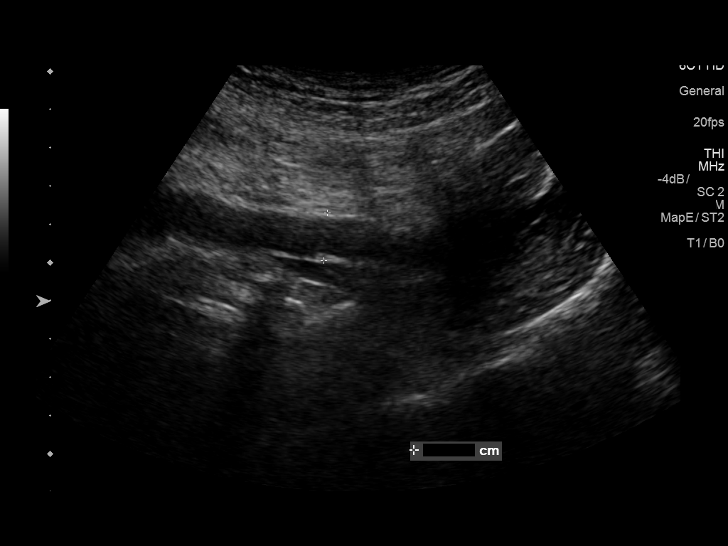
[im 17/24]
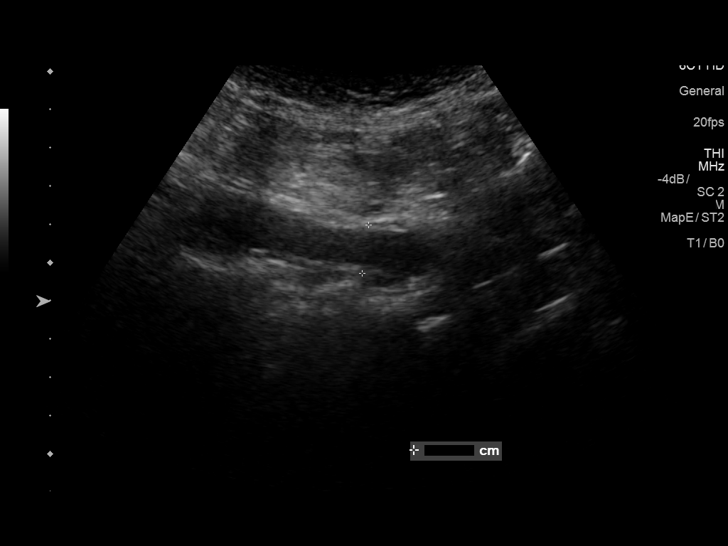
[im 19/24]
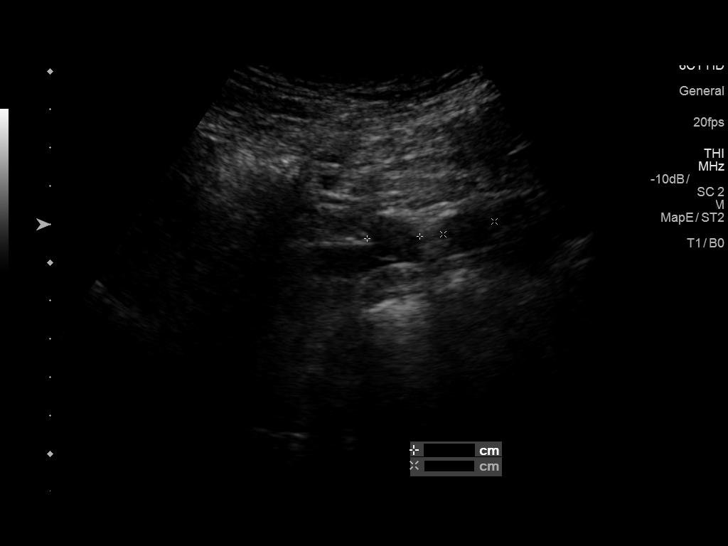
[im 20/24]
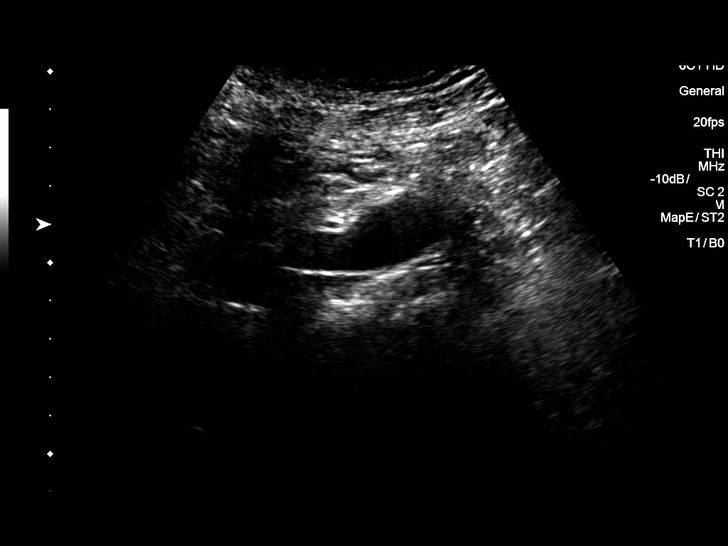
[im 22/24]
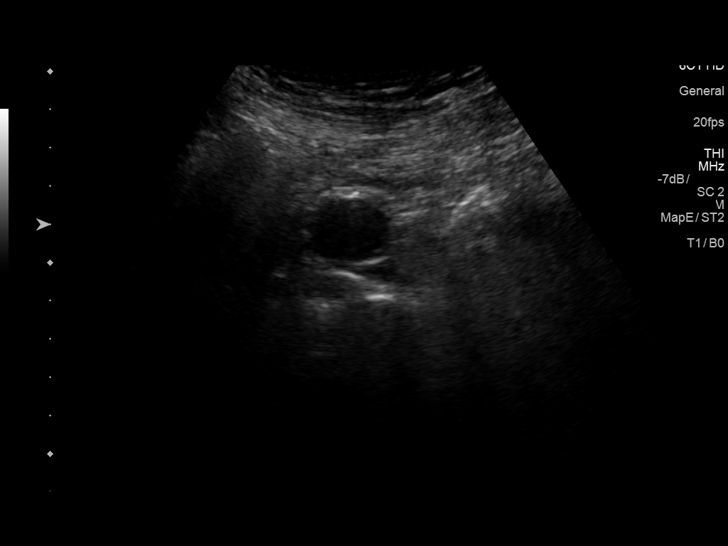
[im 24/24]
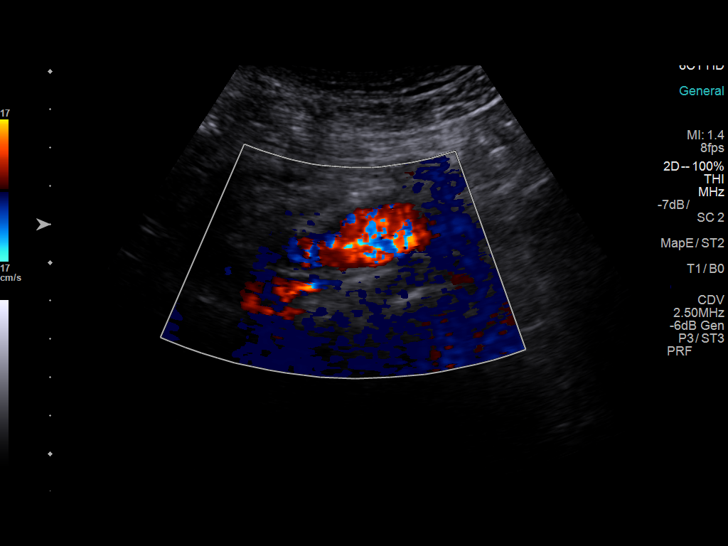

[14 of 24 positions shown; findings below may reference images not displayed]

FINDINGS: Abdominal aortic measurements as follows:

Proximal:  2.6 cm

Mid:  2.0 cm

Distal:  1.9 cm

Mild aortic atherosclerotic plaque noted.
IMPRESSION: No evidence of abdominal aortic aneurysm.

## 2019-05-22 ENCOUNTER — Ambulatory Visit: Payer: Medicare Other | Attending: Internal Medicine

## 2019-10-19 ENCOUNTER — Telehealth (INDEPENDENT_AMBULATORY_CARE_PROVIDER_SITE_OTHER): Payer: Medicare Other | Admitting: Family Medicine

## 2019-10-19 ENCOUNTER — Encounter: Payer: Self-pay | Admitting: Family Medicine

## 2019-10-19 VITALS — Wt 164.0 lb

## 2019-10-19 DIAGNOSIS — R05 Cough: Secondary | ICD-10-CM | POA: Diagnosis not present

## 2019-10-19 DIAGNOSIS — R0982 Postnasal drip: Secondary | ICD-10-CM | POA: Diagnosis not present

## 2019-10-19 DIAGNOSIS — R059 Cough, unspecified: Secondary | ICD-10-CM

## 2019-10-19 NOTE — Patient Instructions (Signed)
-  allegra once daily  -flonase 2 sprays each nostril daily for 3 weeks, then 1 spray each nostril daily  -follow up with Dr. Martinique in about 2 weeks  I hope you are feeling better soon! Seek care promptly if your symptoms worsen, new concerns arise or you are not improving with treatment.

## 2019-10-19 NOTE — Progress Notes (Signed)
Virtual Visit via Video Note  I connected with Richard Middleton  on 10/19/19 at  5:00 PM EDT by a video enabled telemedicine application and verified that I am speaking with the correct person using two identifiers.  Location patient: home, [place] Location provider:work or home office Persons participating in the virtual visit: patient, provider  I discussed the limitations of evaluation and management by telemedicine and the availability of in person appointments. The patient expressed understanding and agreed to proceed.   HPI:  Acute visit for a cough: -reports intermittent cough for "several months" -he is outside a lot more since he retired -reports has chronic nasal congestion, PND - reports when clears out the nasal congestion with saline and an OTC spray - the cough clears up -denies: SOB, fevers, GERD, malaise, night sweats, hemoptysis, unintentional wt loss - he is exercising more and eats less and lost 3-4 lbs in the last 1.5 years -reports remote smoking history only when in college, light smoker - 1.5 pack per week -was a Freight forwarder for work   ROS: See pertinent positives and negatives per HPI.  Past Medical History:  Diagnosis Date  . Abnormal glucose   . History of small bowel obstruction    x2  . Pneumonia    as a child    Past Surgical History:  Procedure Laterality Date  . ACHILLES TENDON SURGERY Right 05/02/2017   Procedure: RIGHT ACHILLES RECONSTRUCTION;  Surgeon: Newt Minion, MD;  Location: [facility];  Service: Orthopedics;  Laterality: Right;  . COLONOSCOPY  2008  . LAPAROSCOPIC SMALL BOWEL RESECTION  3 1/2 years ago, & 10 years ago   x2  . LAPAROTOMY N/A 07/13/2014   Procedure: EXPLORATORY LAPAROTOMY lysis of adhesions;  Surgeon: Leighton Ruff, MD;  Location: [facility];  Service: General;  Laterality: N/A;  . LUMBAR LAMINECTOMY/DECOMPRESSION MICRODISCECTOMY N/A 05/18/2018   Procedure: L4-5 DECOMPRESSION, RIGHT MICRODISCECTOMY;  Surgeon: Marybelle Killings, MD;  Location: [facility];   Service: Orthopedics;  Laterality: N/A;    Family History  Problem Relation Age of Onset  . Arthritis Mother   . Pulmonary embolism Father   . Cancer Neg Hx   . Diabetes Neg Hx   . Colon cancer Neg Hx     SOCIAL HX: see hpi   Current Outpatient Medications:  Marland Kitchen  VITAMIN D PO, Take 500 mg by mouth daily. , Disp: , Rfl:  .  Zoster Vaccine Adjuvanted [facility] injection, 0.5 ml in muscle and repeat in 8 weeks, Disp: 0.5 mL, Rfl: 1  EXAM:  VITALS per patient if applicable:  GENERAL: alert, oriented, appears well and in no acute distress  HEENT: atraumatic, conjunttiva clear, no obvious abnormalities on inspection of external nose and ears  NECK: normal movements of the head and neck  LUNGS: on inspection no signs of respiratory distress, breathing rate appears normal, no obvious gross SOB, gasping or wheezing  CV: no obvious cyanosis  MS: moves all visible extremities without noticeable abnormality  PSYCH/NEURO: pleasant and cooperative, no obvious depression or anxiety, speech and thought processing grossly intact  ASSESSMENT AND PLAN:  Discussed the following assessment and plan:  No diagnosis found.  -we discussed possible serious and likely etiologies, options for evaluation and workup, limitations of telemedicine visit vs in person visit, treatment, treatment risks and precautions. Pt prefers to treat via telemedicine empirically rather then risking or undertaking an in person visit at this moment. Patient agrees to seek prompt in person care if worsening, new symptoms arise, or if  is not improving with treatment.   I discussed the assessment and treatment plan with the patient. The patient was provided an opportunity to ask questions and all were answered. The patient agreed with the plan and demonstrated an understanding of the instructions.   The patient was advised to call back or seek an in-person evaluation if the symptoms worsen or if the condition fails to  improve as anticipated.   Lucretia Kern, DO

## 2019-10-19 NOTE — Progress Notes (Signed)
Virtual Visit via Video Note  I connected with Richard Middleton  on 10/19/19 at  5:00 PM EDT by a video enabled telemedicine application and verified that I am speaking with the correct person using two identifiers.  Location patient: home, [place] Location provider:work or home office Persons participating in the virtual visit: patient, provider  I discussed the limitations of evaluation and management by telemedicine and the availability of in person appointments. The patient expressed understanding and agreed to proceed.   HPI:  Acute visit for a cough: -reports intermittent cough for "several months" -he is outside a lot more since he retired -reports has chronic nasal congestion, PND - reports when clears out the nasal congestion with saline and an OTC spray - the cough clears up -denies: SOB, fevers, GERD, malaise, night sweats, hemoptysis, unintentional wt loss - he is exercising more and eats less and lost 3-4 lbs in the last 1.5 years -reports remote smoking history only when in college, light smoker - 1.5 pack per week -was a Freight forwarder for work   ROS: See pertinent positives and negatives per HPI.  Past Medical History:  Diagnosis Date  . Abnormal glucose   . History of small bowel obstruction    x2  . Pneumonia    as a child    Past Surgical History:  Procedure Laterality Date  . ACHILLES TENDON SURGERY Right 05/02/2017   Procedure: RIGHT ACHILLES RECONSTRUCTION;  Surgeon: Newt Minion, MD;  Location: [facility];  Service: Orthopedics;  Laterality: Right;  . COLONOSCOPY  2008  . LAPAROSCOPIC SMALL BOWEL RESECTION  3 1/2 years ago, & 10 years ago   x2  . LAPAROTOMY N/A 07/13/2014   Procedure: EXPLORATORY LAPAROTOMY lysis of adhesions;  Surgeon: Leighton Ruff, MD;  Location: [facility];  Service: General;  Laterality: N/A;  . LUMBAR LAMINECTOMY/DECOMPRESSION MICRODISCECTOMY N/A 05/18/2018   Procedure: L4-5 DECOMPRESSION, RIGHT MICRODISCECTOMY;  Surgeon: Marybelle Killings, MD;  Location: [facility];   Service: Orthopedics;  Laterality: N/A;    Family History  Problem Relation Age of Onset  . Arthritis Mother   . Pulmonary embolism Father   . Cancer Neg Hx   . Diabetes Neg Hx   . Colon cancer Neg Hx     SOCIAL HX: see hpi   Current Outpatient Medications:  Marland Kitchen  VITAMIN D PO, Take 500 mg by mouth daily. , Disp: , Rfl:  .  Zoster Vaccine Adjuvanted [facility]) injection, 0.5 ml in muscle and repeat in 8 weeks, Disp: 0.5 mL, Rfl: 1  EXAM:  VITALS per patient if applicable:  GENERAL: alert, oriented, appears well and in no acute distress  HEENT: atraumatic, conjunttiva clear, no obvious abnormalities on inspection of external nose and ears  NECK: normal movements of the head and neck  LUNGS: on inspection no signs of respiratory distress, breathing rate appears normal, no obvious gross SOB, gasping or wheezing  CV: no obvious cyanosis  MS: moves all visible extremities without noticeable abnormality  PSYCH/NEURO: pleasant and cooperative, no obvious depression or anxiety, speech and thought processing grossly intact  ASSESSMENT AND PLAN:  Discussed the following assessment and plan:  Cough  PND (post-nasal drip)  -we discussed possible serious and likely etiologies, options for evaluation and workup, limitations of telemedicine visit vs in person visit, treatment, treatment risks and precautions. Pt prefers to treat via telemedicine empirically rather then risking or undertaking an in person visit at this moment. Discussed common and serious causes of a chronic cough. Suspect pnd from allergies  may be playing a part. Opted to try starting allergy regimen of allegra once daily and flonase. Follow up in 2 weeks with PCP (prefers in person visit.) Patient agrees to seek prompt in person care if worsening, new symptoms arise, or if is not improving with treatment.   I discussed the assessment and treatment plan with the patient. The patient was provided an opportunity to ask  questions and all were answered. The patient agreed with the plan and demonstrated an understanding of the instructions.   The patient was advised to call back or seek an in-person evaluation if the symptoms worsen or if the condition fails to improve as anticipated.   Lucretia Kern, DO

## 2019-10-20 ENCOUNTER — Telehealth: Payer: Self-pay | Admitting: Family Medicine

## 2019-10-20 NOTE — Telephone Encounter (Signed)
-----   Message from Lucretia Kern, DO sent at 10/19/2019  5:21 PM EDT ----- Needs 2 - 3 week follow up with Dr. Martinique. Prefers in person if possible. Cough has been for many months so is out of isolation time for COVID and is fully vaccinated.

## 2019-10-20 NOTE — Telephone Encounter (Signed)
Spoke to pt and scheduled an appt with Dr. Martinique for a follow up.

## 2019-11-08 ENCOUNTER — Ambulatory Visit (INDEPENDENT_AMBULATORY_CARE_PROVIDER_SITE_OTHER)
Admission: RE | Admit: 2019-11-08 | Discharge: 2019-11-08 | Disposition: A | Discharge status: Home / Self Care | Payer: Medicare Other | Source: Ambulatory Visit | Attending: Family Medicine | Admitting: Family Medicine

## 2019-11-08 ENCOUNTER — Other Ambulatory Visit: Payer: Medicare Other

## 2019-11-08 ENCOUNTER — Encounter: Payer: Self-pay | Admitting: Family Medicine

## 2019-11-08 ENCOUNTER — Ambulatory Visit (INDEPENDENT_AMBULATORY_CARE_PROVIDER_SITE_OTHER): Payer: Medicare Other | Admitting: Family Medicine

## 2019-11-08 ENCOUNTER — Other Ambulatory Visit: Payer: Self-pay

## 2019-11-08 VITALS — BP 138/80 | HR 85 | Temp 98.5°F | Resp 12 | Ht 73.0 in | Wt 165.0 lb

## 2019-11-08 DIAGNOSIS — J310 Chronic rhinitis: Secondary | ICD-10-CM | POA: Diagnosis not present

## 2019-11-08 DIAGNOSIS — R053 Chronic cough: Secondary | ICD-10-CM

## 2019-11-08 DIAGNOSIS — R05 Cough: Secondary | ICD-10-CM

## 2019-11-08 MED ORDER — OMEPRAZOLE 40 MG PO CPDR: 40.0000 mg | DELAYED_RELEASE_CAPSULE | Freq: Every day | ORAL | 3 refills | Status: DC

## 2019-11-08 NOTE — Progress Notes (Signed)
HPI: [name] is a 74 y.o. male, who is here today to follow on recent OV. He had virtual on 10/19/19 c/o persistent cough that has been going on for month. It happens usually indoor and at night, around bed time. No relation with food intake.  It does not happen with exertion and no associated cough,wheezing, or CP. Post nasal drainage and a "tickle" sensation in his throat seem to exacerbate problem.  He started OTC antihistaminic and nasal spray Flonase. His family noted improvement but symptoms back for the past week.  Negative for fever,chills,abnormal wt loss, fatigue,sore throat,dysphagia,nausea,or vomiting. He has not noted heartburn.  Problem has not affected his daily activities. He is very active with yard work and walks daily 2-3 miles.  + Former smoker.  Review of Systems  Constitutional: Negative for appetite change and diaphoresis.  HENT: Positive for rhinorrhea. Negative for congestion, mouth sores and voice change.   Cardiovascular: Negative for palpitations and leg swelling.  Gastrointestinal: Negative for abdominal pain, nausea and vomiting.  Musculoskeletal: Negative for myalgias and neck pain.  Skin: Negative for pallor and rash.  Allergic/Immunologic: Positive for environmental allergies.  Neurological: Negative for syncope, weakness and headaches.  Rest see pertinent positives and negatives per HPI.  Current Outpatient Medications on File Prior to Visit  Medication Sig Dispense Refill  . VITAMIN D PO Take 500 mg by mouth daily.      No current facility-administered medications on file prior to visit.   Past Medical History:  Diagnosis Date  . Abnormal glucose   . History of small bowel obstruction    x2  . Pneumonia    as a child   No Known Allergies  Social History   Socioeconomic History  . Marital status: Married    Spouse name: Not on file  . Number of children: 1  . Years of education: 6  . Highest education level: Associate  degree: occupational, Hotel manager, or vocational program  Occupational History  . Occupation: retired  Tobacco Use  . Smoking status: Former Smoker    Packs/day: 4.00    Types: Cigarettes    Start date: 04/15/1966    Quit date: 04/15/1970    Years since quitting: 49.6  . Smokeless tobacco: Never Used  Vaping Use  . Vaping Use: Never used  Substance and Sexual Activity  . Alcohol use: Yes    Alcohol/week: 1.0 standard drink    Types: 1 Glasses of wine per week    Comment: 2-3 times per month   . Drug use: No  . Sexual activity: Not on file  Other Topics Concern  . Not on file  Social History Narrative  . Not on file   Social Determinants of Health   Financial Resource Strain:   . Difficulty of Paying Living Expenses:   Food Insecurity:   . Worried About Charity fundraiser in the Last Year:   . Arboriculturist in the Last Year:   Transportation Needs:   . Film/video editor (Medical):   Marland Kitchen Lack of Transportation (Non-Medical):   Physical Activity:   . Days of Exercise per Week:   . Minutes of Exercise per Session:   Stress:   . Feeling of Stress :   Social Connections:   . Frequency of Communication with Friends and Family:   . Frequency of Social Gatherings with Friends and Family:   . Attends Religious Services:   . Active Member of Clubs or Organizations:   .  Attends Archivist Meetings:   Marland Kitchen Marital Status:     Vitals:   11/08/19 1408  BP: (!) 138/80  Pulse: 85  Resp: 12  Temp: 98.5 F (36.9 C)  SpO2: 95%   Body mass index is 21.77 kg/m.  Physical Exam Nursing note reviewed.  Constitutional:      General: He is not in acute distress.    Appearance: He is well-developed and normal weight.  HENT:     Head: Normocephalic and atraumatic.     Nose:     Comments: Clear post nasal drainage.    Mouth/Throat:     Mouth: Mucous membranes are moist.     Pharynx: Oropharynx is clear.  Eyes:     Conjunctiva/sclera: Conjunctivae normal.      Pupils: Pupils are equal, round, and reactive to light.  Cardiovascular:     Rate and Rhythm: Normal rate and regular rhythm.     Heart sounds: No murmur heard.   Pulmonary:     Effort: Pulmonary effort is normal. No respiratory distress.     Breath sounds: Normal breath sounds.  Abdominal:     Palpations: Abdomen is soft. There is no hepatomegaly or mass.     Tenderness: There is no abdominal tenderness.  Lymphadenopathy:     Cervical: No cervical adenopathy.  Skin:    General: Skin is warm.     Findings: No erythema or rash.  Neurological:     Mental Status: He is alert and oriented to person, place, and time.     Cranial Nerves: No cranial nerve deficit.     Gait: Gait normal.  Psychiatric:        Mood and Affect: Mood and affect normal.     Comments: Well groomed, good eye contact.    ASSESSMENT AND PLAN:  Richard Middleton was seen today for follow-up.  Diagnoses and all orders for this visit:  Persistent cough -     DG Chest 2 View; Future  Rhinitis, unspecified type  Other orders -     omeprazole (PRILOSEC) 40 MG capsule; Take 1 capsule (40 mg total) by mouth daily.     Orders Placed This Encounter  Procedures  . DG Chest 2 View    Persistent cough Chronic. We discussed possible etiologies. ? Allergies,COPD,and GERD to be considered. CXR will be arranged. He agrees with PPI trial.  Rhinitis, unspecified type Contributing factor. Continue OTC antihistaminic and intranasal Flonase. Nasal saline irrigations as needed may also help.  Other orders -     omeprazole (PRILOSEC) 40 MG capsule; Take 1 capsule (40 mg total) by mouth daily.   Return for Keep next appt..    Braxtyn Dorff G. Martinique, MD  [facility]. [place] office.  A few things to remember from today's visit:  ? GERD, let's try Omeprazole 30 min before breakfast. Chest X ray. Nasal saline irrigations.  If you need refills please call your pharmacy. Do not use My Chart to request refills  or for acute issues that need immediate attention.    Please be sure medication list is accurate. If a new problem present, please set up appointment sooner than planned today.

## 2019-11-08 NOTE — Patient Instructions (Signed)
A few things to remember from today's visit:  ? GERD, let's try Omeprazole 30 min before breakfast. Chest X ray. Nasal saline irrigations.  If you need refills please call your pharmacy. Do not use My Chart to request refills or for acute issues that need immediate attention.    Please be sure medication list is accurate. If a new problem present, please set up appointment sooner than planned today.

## 2019-11-09 ENCOUNTER — Telehealth: Payer: Self-pay | Admitting: Family Medicine

## 2019-11-09 NOTE — Telephone Encounter (Signed)
Please advise 

## 2019-11-09 NOTE — Telephone Encounter (Signed)
Pt called to ask do he have to continue the over the counter meds like the allegra and the flonase along with the new medication omeprazole that Dr. Martinique started him on yesterday for the coughing. Please Advise. [phone]

## 2019-11-10 NOTE — Telephone Encounter (Signed)
Yes, all meds for now. Also saline nasal irrigations. Thanks, BJ

## 2019-11-10 NOTE — Telephone Encounter (Signed)
I spoke with pt. We went over the information below & also let pt know that his x-ray was normal.

## 2019-12-03 ENCOUNTER — Other Ambulatory Visit: Payer: Self-pay

## 2019-12-03 ENCOUNTER — Ambulatory Visit (INDEPENDENT_AMBULATORY_CARE_PROVIDER_SITE_OTHER): Payer: Medicare Other | Admitting: Family Medicine

## 2019-12-03 ENCOUNTER — Encounter: Payer: Self-pay | Admitting: Family Medicine

## 2019-12-03 VITALS — BP 128/70 | HR 86 | Temp 98.1°F | Resp 16 | Ht 73.0 in | Wt 167.2 lb

## 2019-12-03 DIAGNOSIS — I700 Atherosclerosis of aorta: Secondary | ICD-10-CM

## 2019-12-03 DIAGNOSIS — R1032 Left lower quadrant pain: Secondary | ICD-10-CM

## 2019-12-03 DIAGNOSIS — R7303 Prediabetes: Secondary | ICD-10-CM

## 2019-12-03 DIAGNOSIS — R059 Cough, unspecified: Secondary | ICD-10-CM

## 2019-12-03 DIAGNOSIS — Z0000 Encounter for general adult medical examination without abnormal findings: Secondary | ICD-10-CM

## 2019-12-03 DIAGNOSIS — R05 Cough: Secondary | ICD-10-CM

## 2019-12-03 DIAGNOSIS — N401 Enlarged prostate with lower urinary tract symptoms: Secondary | ICD-10-CM | POA: Diagnosis not present

## 2019-12-03 DIAGNOSIS — R351 Nocturia: Secondary | ICD-10-CM

## 2019-12-03 DIAGNOSIS — N529 Male erectile dysfunction, unspecified: Secondary | ICD-10-CM

## 2019-12-03 MED ORDER — TADALAFIL 5 MG PO TABS: 5.0000 mg | ORAL_TABLET | Freq: Every day | ORAL | 3 refills | Status: DC

## 2019-12-03 NOTE — Progress Notes (Addendum)
HPI: [name] is a 74 y.o.male here today for his routine physical examination.  Last CPE: 11/20/18 He lives with his wife and his son.  Regular exercise 3 or more times per week: He tries to walk 2-3 miles per day if weather permits it. He is also active doing yard work. Following a healthful diet: Yes. Daily fruit, low fat, and avoid red meat. Turkey,fish,and chicken. Sleeps about 7 hours. Drinks wine occasionally.  Chronic medical problems: Aortic atherosclerosis seen on abdominal CT, ED and BPH.  Prediabetes: Lab Results  Component Value Date   HGBA1C 6.0 11/20/2018   Negative for history of hypertension, hyperlipidemia,or CAD.  Immunization History  Administered Date(s) Administered  . Influenza Split 01/24/2012  . Influenza Whole 01/12/2007, 01/05/2008, 02/01/2009, 01/09/2010  . Influenza, High Dose Seasonal PF 12/22/2015, 01/19/2018  . Influenza,inj,Quad PF,6+ Mos 01/11/2013  . Influenza-Unspecified 01/19/2018, 01/20/2019  . PFIZER SARS-COV-2 Vaccination 05/20/2019, 06/10/2019  . Pneumococcal Conjugate-13 08/18/2015  . Pneumococcal Polysaccharide-23 01/24/2012, 08/27/2017  . Td 08/01/2006  . Zoster 07/21/2007  . Zoster Recombinat (Shingrix) 01/20/2019   -Hep C screening:08/2016 NR  Last colon cancer screening: 10/31/16. Last prostate ca screening: 11/20/18 PSA normal at 0.28. Nocturia x 1-2.  Former smoker.  -Concerns and/or follow up today:   Persistent cough: Cough has improved, 20% Cough has been going on for about 4 months. He has not identified exacerbating or alleviating factors. He is using nasal saline and has helped. He is not sure Omeprazole is helping. Former smoker.  For the last couple days achy pain LLQ. Intermittent. Negative for changes in bowel habits, nausea, blood in the stool, or urinary symptoms.  Since he had achilles tendon surgery he has limitation of movement of right great toe and 2nd toe. According to pt, he was told  he may have some residual limitation of movement. There is no joint pain.  ED: He did not take medication prescribed in the past because he was afraid of side effects.  Review of Systems  Constitutional: Negative for activity change, appetite change, fatigue, fever and unexpected weight change.  HENT: Positive for postnasal drip. Negative for dental problem, nosebleeds, sore throat and trouble swallowing.   Eyes: Negative for redness and visual disturbance.  Respiratory: Negative for shortness of breath and wheezing.   Cardiovascular: Negative for chest pain, palpitations and leg swelling.  Gastrointestinal: Negative for abdominal distention and vomiting.  Endocrine: Negative for cold intolerance, heat intolerance, polydipsia, polyphagia and polyuria.  Genitourinary: Negative for decreased urine volume, dysuria, genital sores, hematuria and testicular pain.  Musculoskeletal: Negative for gait problem and myalgias.  Skin: Negative for color change and rash.  Allergic/Immunologic: Positive for environmental allergies.  Neurological: Negative for syncope, weakness and headaches.  Hematological: Negative for adenopathy. Does not bruise/bleed easily.  Psychiatric/Behavioral: Negative for confusion and sleep disturbance. The patient is not nervous/anxious.    Current Outpatient Medications on File Prior to Visit  Medication Sig Dispense Refill  . omeprazole (PRILOSEC) 40 MG capsule Take 1 capsule (40 mg total) by mouth daily. 30 capsule 3  . VITAMIN D PO Take 500 mg by mouth daily.      No current facility-administered medications on file prior to visit.   Past Medical History:  Diagnosis Date  . Abnormal glucose   . History of small bowel obstruction    x2  . Pneumonia    as a child    Past Surgical History:  Procedure Laterality Date  . ACHILLES TENDON  SURGERY Right 05/02/2017   Procedure: RIGHT ACHILLES RECONSTRUCTION;  Surgeon: Newt Minion, MD;  Location: [facility];  Service:  Orthopedics;  Laterality: Right;  . COLONOSCOPY  2008  . LAPAROSCOPIC SMALL BOWEL RESECTION  3 1/2 years ago, & 10 years ago   x2  . LAPAROTOMY N/A 07/13/2014   Procedure: EXPLORATORY LAPAROTOMY lysis of adhesions;  Surgeon: Leighton Ruff, MD;  Location: [facility];  Service: General;  Laterality: N/A;  . LUMBAR LAMINECTOMY/DECOMPRESSION MICRODISCECTOMY N/A 05/18/2018   Procedure: L4-5 DECOMPRESSION, RIGHT MICRODISCECTOMY;  Surgeon: Marybelle Killings, MD;  Location: [facility];  Service: Orthopedics;  Laterality: N/A;   No Known Allergies  Family History  Problem Relation Age of Onset  . Arthritis Mother   . Pulmonary embolism Father   . Cancer Neg Hx   . Diabetes Neg Hx   . Colon cancer Neg Hx     Social History   Socioeconomic History  . Marital status: Married    Spouse name: Not on file  . Number of children: 1  . Years of education: 6  . Highest education level: Associate degree: occupational, Hotel manager, or vocational program  Occupational History  . Occupation: retired  Tobacco Use  . Smoking status: Former Smoker    Packs/day: 4.00    Types: Cigarettes    Start date: 04/15/1966    Quit date: 04/15/1970    Years since quitting: 49.6  . Smokeless tobacco: Never Used  Vaping Use  . Vaping Use: Never used  Substance and Sexual Activity  . Alcohol use: Yes    Alcohol/week: 1.0 standard drink    Types: 1 Glasses of wine per week    Comment: 2-3 times per month   . Drug use: No  . Sexual activity: Not on file  Other Topics Concern  . Not on file  Social History Narrative  . Not on file   Social Determinants of Health   Financial Resource Strain:   . Difficulty of Paying Living Expenses: Not on file  Food Insecurity:   . Worried About Charity fundraiser in the Last Year: Not on file  . Ran Out of Food in the Last Year: Not on file  Transportation Needs:   . Lack of Transportation (Medical): Not on file  . Lack of Transportation (Non-Medical): Not on file  Physical Activity:    . Days of Exercise per Week: Not on file  . Minutes of Exercise per Session: Not on file  Stress:   . Feeling of Stress : Not on file  Social Connections:   . Frequency of Communication with Friends and Family: Not on file  . Frequency of Social Gatherings with Friends and Family: Not on file  . Attends Religious Services: Not on file  . Active Member of Clubs or Organizations: Not on file  . Attends Archivist Meetings: Not on file  . Marital Status: Not on file   Vitals:   12/03/19 0800  BP: 128/70  Pulse: 86  Resp: 16  Temp: 98.1 F (36.7 C)  SpO2: 96%   Body mass index is 22.07 kg/m.  Wt Readings from Last 3 Encounters:  12/03/19 167 lb 4 oz (75.9 kg)  11/08/19 165 lb (74.8 kg)  10/19/19 164 lb (74.4 kg)   Physical Exam Vitals and nursing note reviewed.  Constitutional:      General: He is not in acute distress.    Appearance: He is well-developed, well-groomed and normal weight.  HENT:  Head: Normocephalic and atraumatic.     Right Ear: Tympanic membrane, ear canal and external ear normal.     Left Ear: Tympanic membrane, ear canal and external ear normal.     Mouth/Throat:     Mouth: Mucous membranes are moist.     Pharynx: Oropharynx is clear.  Eyes:     Extraocular Movements: Extraocular movements intact.     Conjunctiva/sclera: Conjunctivae normal.     Pupils: Pupils are equal, round, and reactive to light.  Neck:     Thyroid: No thyromegaly.     Trachea: No tracheal deviation.  Cardiovascular:     Rate and Rhythm: Normal rate and regular rhythm.     Pulses:          Dorsalis pedis pulses are 2+ on the right side and 2+ on the left side.     Heart sounds: No murmur heard.   Pulmonary:     Effort: Pulmonary effort is normal. No respiratory distress.     Breath sounds: Normal breath sounds.  Abdominal:     Palpations: Abdomen is soft. There is no hepatomegaly or mass.     Tenderness: There is no abdominal tenderness.  Genitourinary:     Comments: No concerns. Musculoskeletal:        General: No tenderness.     Cervical back: Normal range of motion.     Comments: No major deformities appreciated and no signs of synovitis.  Lymphadenopathy:     Cervical: No cervical adenopathy.     Upper Body:     Right upper body: No supraclavicular adenopathy.     Left upper body: No supraclavicular adenopathy.  Skin:    General: Skin is warm.     Findings: No erythema.  Neurological:     General: No focal deficit present.     Mental Status: He is alert and oriented to person, place, and time.     Cranial Nerves: No cranial nerve deficit.     Sensory: No sensory deficit.     Coordination: Coordination normal.     Gait: Gait normal.     Deep Tendon Reflexes:     Reflex Scores:      Bicep reflexes are 2+ on the right side and 2+ on the left side.      Patellar reflexes are 2+ on the right side and 2+ on the left side. Psychiatric:        Mood and Affect: Mood and affect normal.   ASSESSMENT AND PLAN:  Mr.Ayren was seen today for annual exam.  Diagnoses and all orders for this visit: Orders Placed This Encounter  Procedures  . CT Chest Wo Contrast  . Basic metabolic panel  . Lipid panel  . PSA(Must document that pt has been informed of limitations of PSA testing.)  . Hemoglobin A1c   Lab Results  Component Value Date   HGBA1C 5.9 (H) 12/03/2019   Lab Results  Component Value Date   CHOL 128 12/03/2019   HDL 58 12/03/2019   LDLCALC 54 12/03/2019   TRIG 75 12/03/2019   CHOLHDL 2.2 12/03/2019   Lab Results  Component Value Date   CREATININE 1.19 (H) 12/03/2019   BUN 18 12/03/2019   NA 139 12/03/2019   K 3.9 12/03/2019   CL 102 12/03/2019   CO2 29 12/03/2019   Lab Results  Component Value Date   PSA 0.2 12/03/2019    Routine general medical examination at a health care facility Encouraged to continue regular  physical activity and healthful diet for prevention of chronic illness. Preventive guidelines  reviewed. Vaccination up-to-date  Next CPE in a year.  Atherosclerosis of aorta ([place]) We discussed diagnosis and prognosis. Aspirin 81 mg daily recommended, we discussed some side effects. We will wait on lipid panel results for his statin recommendations.  BPH associated with nocturia Problem has been stable. He agrees with trying Cialis 5 mg daily. We discussed some side effects.  Cough Problem has been persistent, little improvement with treating allergies and GERD. CXR has been otherwise negative, 11/09/19. Former smoker. Chest CT will be arranged.  Prediabetes Continue a healthy lifestyle for primary prevention of diabetes. Further recommendation will be given according to A1c results.  LLQ abdominal pain Pain was not reproducible during examination today. History and examination today do not suggest a serious process. We discussed possible etiologies. Recommend monitoring for changes.  Erectile dysfunction, unspecified erectile dysfunction type Cialis 5 mg daily may help.  Return in 1 year (on 12/02/2020) for 04/06/20 he needs AWV.Marland Kitchen   Bindi Klomp G. Martinique, MD  [facility]. [place] office.  A few things to remember from today's visit:   Routine general medical examination at a health care facility  Atherosclerosis of aorta [facility]) - Plan: Basic metabolic panel, Lipid panel  BPH associated with nocturia - Plan: PSA(Must document that pt has been informed of limitations of PSA testing.), tadalafil (CIALIS) 5 MG tablet  Cough - Plan: CT Chest Wo Contrast  If you need refills please call your pharmacy. Do not use My Chart to request refills or for acute issues that need immediate attention.   Please be sure medication list is accurate. If a new problem present, please set up appointment sooner than planned today.  At least 150 minutes of moderate exercise per week, daily brisk walking for 15-30 min is a good exercise option. Healthy diet low in saturated  (animal) fats and sweets and consisting of fresh fruits and vegetables, lean meats such as fish and white chicken and whole grains.  - Vaccines:  Tdap vaccine every 10 years.  Shingles vaccine recommended at age 30, could be given after 73 years of age but not sure about insurance coverage.  Pneumonia vaccines: Pneumovax at 10  -Screening recommendations for low/normal risk males:  Screening for diabetes at age 59 and every 3 years. Earlier screening if cardiovascular risk factors.   Lipid screening at 35 and every 3 years. Screening starts in younger males with cardiovascular risk factors.  Colon cancer screening is now at age 55 but your insurance may not cover until age 59 .screening is recommended age 6.  Prostate cancer screening: some controversy, starts usually at 71: Rectal exam and PSA.  Aortic Abdominal Aneurism once between 21 and 35 years old if ever smoker.  Also recommended:  1. Dental visit- Brush and floss your teeth twice daily; visit your dentist twice a year. 2. Eye doctor- Get an eye exam at least every 2 years. 3. Helmet use- Always wear a helmet when riding a bicycle, motorcycle, rollerblading or skateboarding. 4. Safe sex- If you may be exposed to sexually transmitted infections, use a condom. 5. Seat belts- Seat belts can save your live; always wear one. 6. Smoke/Carbon Monoxide detectors- These detectors need to be installed on the appropriate level of your home. Replace batteries at least once a year. 7. Skin cancer- When out in the sun please cover up and use sunscreen 15 SPF or higher. 8. Violence- If anyone is threatening or  hurting you, please tell your healthcare provider.  9. Drink alcohol in moderation- Limit alcohol intake to one drink or less per day. Never drink and drive.

## 2019-12-03 NOTE — Patient Instructions (Addendum)
A few things to remember from today's visit:   Routine general medical examination at a health care facility  Atherosclerosis of aorta [facility]) - Plan: Basic metabolic panel, Lipid panel  BPH associated with nocturia - Plan: PSA(Must document that pt has been informed of limitations of PSA testing.), tadalafil (CIALIS) 5 MG tablet  Cough - Plan: CT Chest Wo Contrast  If you need refills please call your pharmacy. Do not use My Chart to request refills or for acute issues that need immediate attention.   Please be sure medication list is accurate. If a new problem present, please set up appointment sooner than planned today.  At least 150 minutes of moderate exercise per week, daily brisk walking for 15-30 min is a good exercise option. Healthy diet low in saturated (animal) fats and sweets and consisting of fresh fruits and vegetables, lean meats such as fish and white chicken and whole grains.  - Vaccines:  Tdap vaccine every 10 years.  Shingles vaccine recommended at age 52, could be given after 73 years of age but not sure about insurance coverage.  Pneumonia vaccines: Pneumovax at 68  -Screening recommendations for low/normal risk males:  Screening for diabetes at age 21 and every 3 years. Earlier screening if cardiovascular risk factors.   Lipid screening at 35 and every 3 years. Screening starts in younger males with cardiovascular risk factors.  Colon cancer screening is now at age 53 but your insurance may not cover until age 42 .screening is recommended age 24.  Prostate cancer screening: some controversy, starts usually at 61: Rectal exam and PSA.  Aortic Abdominal Aneurism once between 16 and 66 years old if ever smoker.  Also recommended:  1. Dental visit- Brush and floss your teeth twice daily; visit your dentist twice a year. 2. Eye doctor- Get an eye exam at least every 2 years. 3. Helmet use- Always wear a helmet when riding a bicycle, motorcycle,  rollerblading or skateboarding. 4. Safe sex- If you may be exposed to sexually transmitted infections, use a condom. 5. Seat belts- Seat belts can save your live; always wear one. 6. Smoke/Carbon Monoxide detectors- These detectors need to be installed on the appropriate level of your home. Replace batteries at least once a year. 7. Skin cancer- When out in the sun please cover up and use sunscreen 15 SPF or higher. 8. Violence- If anyone is threatening or hurting you, please tell your healthcare provider.  9. Drink alcohol in moderation- Limit alcohol intake to one drink or less per day. Never drink and drive.

## 2019-12-04 LAB — BASIC METABOLIC PANEL
BUN/Creatinine Ratio: 15 (calc) (ref 6–22)
BUN: 18 mg/dL (ref 7–25)
CO2: 29 mmol/L (ref 20–32)
Calcium: 9.5 mg/dL (ref 8.6–10.3)
Chloride: 102 mmol/L (ref 98–110)
Creat: 1.19 mg/dL — ABNORMAL HIGH (ref 0.70–1.18)
Glucose, Bld: 80 mg/dL (ref 65–99)
Potassium: 3.9 mmol/L (ref 3.5–5.3)
Sodium: 139 mmol/L (ref 135–146)

## 2019-12-04 LAB — LIPID PANEL
Cholesterol: 128 mg/dL (ref <200)
HDL: 58 mg/dL (ref >=40)
LDL Cholesterol (Calc): 54 mg/dL (calc)
Non-HDL Cholesterol (Calc): 70 mg/dL (calc) (ref <130)
Total CHOL/HDL Ratio: 2.2 (calc) (ref <5.0)
Triglycerides: 75 mg/dL (ref <150)

## 2019-12-04 LAB — PSA: PSA: 0.2 ng/mL (ref <=4.0)

## 2019-12-04 LAB — HEMOGLOBIN A1C
Hgb A1c MFr Bld: 5.9 % of total Hgb — ABNORMAL HIGH (ref <5.7)
Mean Plasma Glucose: 123 (calc)
eAG (mmol/L): 6.8 (calc)

## 2019-12-06 ENCOUNTER — Telehealth: Payer: Self-pay | Admitting: Family Medicine

## 2019-12-06 NOTE — Telephone Encounter (Signed)
Pt is calling stating that he is not sure if he is suppose to continue to take omeprazole (PRILOSEC) 40 MG and some kind of allergy medication not sure to the name of it but spoke with Dr. Martinique about it on Friday 12/03/2019.  Pt would like to have a call back and if you do not get him on the landline first please use this number [phone] mobile.

## 2019-12-06 NOTE — Telephone Encounter (Signed)
He did not feel like Omeprazole help much with cough, so he can discontinue. If cough gets worse after d/c PPI, he can resume it. Thanks, BJ

## 2019-12-06 NOTE — Telephone Encounter (Signed)
I called and spoke with patient. We went over the information below & he verbalized understanding.  

## 2020-04-25 ENCOUNTER — Ambulatory Visit (INDEPENDENT_AMBULATORY_CARE_PROVIDER_SITE_OTHER): Payer: Medicare Other

## 2020-04-25 ENCOUNTER — Other Ambulatory Visit: Payer: Self-pay

## 2020-04-25 VITALS — Wt 168.3 lb

## 2020-04-25 DIAGNOSIS — Z0000 Encounter for general adult medical examination without abnormal findings: Secondary | ICD-10-CM | POA: Diagnosis not present

## 2020-04-25 NOTE — Patient Instructions (Signed)
Richard Middleton , Thank you for taking time to come for your Medicare Wellness Visit. I appreciate your ongoing commitment to your health goals. Please review the following plan we discussed and let me know if I can assist you in the future.   Screening recommendations/referrals: Colonoscopy: Up to date, next due 10/31/2021 Recommended yearly ophthalmology/optometry visit for glaucoma screening and checkup Recommended yearly dental visit for hygiene and checkup  Vaccinations: Influenza vaccine: Up to date, next due fall 2022 Pneumococcal vaccine: Completed series  Tdap vaccine: Currently due, if you wish to receive you do so at our office or your local pharmacy.  Shingles vaccine: Completed series     Advanced directives: Please bring in copies of your advanced directives so that we may scan them into your chart.  Conditions/risks identified: None   Next appointment: 0/03/2022 @ 11:15 am with [place] 65 Years and Older, Male Preventive care refers to lifestyle choices and visits with your health care provider that can promote health and wellness. What does preventive care include?  A yearly physical exam. This is also called an annual well check.  Dental exams once or twice a year.  Routine eye exams. Ask your health care provider how often you should have your eyes checked.  Personal lifestyle choices, including:  Daily care of your teeth and gums.  Regular physical activity.  Eating a healthy diet.  Avoiding tobacco and drug use.  Limiting alcohol use.  Practicing safe sex.  Taking low doses of aspirin every day.  Taking vitamin and mineral supplements as recommended by your health care provider. What happens during an annual well check? The services and screenings done by your health care provider during your annual well check will depend on your age, overall health, lifestyle risk factors, and family history of disease. Counseling  Your  health care provider may ask you questions about your:  Alcohol use.  Tobacco use.  Drug use.  Emotional well-being.  Home and relationship well-being.  Sexual activity.  Eating habits.  History of falls.  Memory and ability to understand (cognition).  Work and work Statistician. Screening  You may have the following tests or measurements:  Height, weight, and BMI.  Blood pressure.  Lipid and cholesterol levels. These may be checked every 5 years, or more frequently if you are over 38 years old.  Skin check.  Lung cancer screening. You may have this screening every year starting at age 94 if you have a 30-pack-year history of smoking and currently smoke or have quit within the past 15 years.  Fecal occult blood test (FOBT) of the stool. You may have this test every year starting at age 46.  Flexible sigmoidoscopy or colonoscopy. You may have a sigmoidoscopy every 5 years or a colonoscopy every 10 years starting at age 29.  Prostate cancer screening. Recommendations will vary depending on your family history and other risks.  Hepatitis C blood test.  Hepatitis B blood test.  Sexually transmitted disease (STD) testing.  Diabetes screening. This is done by checking your blood sugar (glucose) after you have not eaten for a while (fasting). You may have this done every 1-3 years.  Abdominal aortic aneurysm (AAA) screening. You may need this if you are a current or former smoker.  Osteoporosis. You may be screened starting at age 44 if you are at high risk. Talk with your health care provider about your test results, treatment options, and if necessary, the need for more  tests. Vaccines  Your health care provider may recommend certain vaccines, such as:  Influenza vaccine. This is recommended every year.  Tetanus, diphtheria, and acellular pertussis (Tdap, Td) vaccine. You may need a Td booster every 10 years.  Zoster vaccine. You may need this after age  53.  Pneumococcal 13-valent conjugate (PCV13) vaccine. One dose is recommended after age 67.  Pneumococcal polysaccharide (PPSV23) vaccine. One dose is recommended after age 11. Talk to your health care provider about which screenings and vaccines you need and how often you need them. This information is not intended to replace advice given to you by your health care provider. Make sure you discuss any questions you have with your health care provider. Document Released: 04/28/2015 Document Revised: 12/20/2015 Document Reviewed: 01/31/2015 Elsevier Interactive Patient Education  2017 [employer] Prevention in the Home Falls can cause injuries. They can happen to people of all ages. There are many things you can do to make your home safe and to help prevent falls. What can I do on the outside of my home?  Regularly fix the edges of walkways and driveways and fix any cracks.  Remove anything that might make you trip as you walk through a door, such as a raised step or threshold.  Trim any bushes or trees on the path to your home.  Use bright outdoor lighting.  Clear any walking paths of anything that might make someone trip, such as rocks or tools.  Regularly check to see if handrails are loose or broken. Make sure that both sides of any steps have handrails.  Any raised decks and porches should have guardrails on the edges.  Have any leaves, snow, or ice cleared regularly.  Use sand or salt on walking paths during winter.  Clean up any spills in your garage right away. This includes oil or grease spills. What can I do in the bathroom?  Use night lights.  Install grab bars by the toilet and in the tub and shower. Do not use towel bars as grab bars.  Use non-skid mats or decals in the tub or shower.  If you need to sit down in the shower, use a plastic, non-slip stool.  Keep the floor dry. Clean up any water that spills on the floor as soon as it happens.  Remove  soap buildup in the tub or shower regularly.  Attach bath mats securely with double-sided non-slip rug tape.  Do not have throw rugs and other things on the floor that can make you trip. What can I do in the bedroom?  Use night lights.  Make sure that you have a light by your bed that is easy to reach.  Do not use any sheets or blankets that are too big for your bed. They should not hang down onto the floor.  Have a firm chair that has side arms. You can use this for support while you get dressed.  Do not have throw rugs and other things on the floor that can make you trip. What can I do in the kitchen?  Clean up any spills right away.  Avoid walking on wet floors.  Keep items that you use a lot in easy-to-reach places.  If you need to reach something above you, use a strong step stool that has a grab bar.  Keep electrical cords out of the way.  Do not use floor polish or wax that makes floors slippery. If you must use wax, use non-skid floor  wax.  Do not have throw rugs and other things on the floor that can make you trip. What can I do with my stairs?  Do not leave any items on the stairs.  Make sure that there are handrails on both sides of the stairs and use them. Fix handrails that are broken or loose. Make sure that handrails are as long as the stairways.  Check any carpeting to make sure that it is firmly attached to the stairs. Fix any carpet that is loose or worn.  Avoid having throw rugs at the top or bottom of the stairs. If you do have throw rugs, attach them to the floor with carpet tape.  Make sure that you have a light switch at the top of the stairs and the bottom of the stairs. If you do not have them, ask someone to add them for you. What else can I do to help prevent falls?  Wear shoes that:  Do not have high heels.  Have rubber bottoms.  Are comfortable and fit you well.  Are closed at the toe. Do not wear sandals.  If you use a  stepladder:  Make sure that it is fully opened. Do not climb a closed stepladder.  Make sure that both sides of the stepladder are locked into place.  Ask someone to hold it for you, if possible.  Clearly mark and make sure that you can see:  Any grab bars or handrails.  First and last steps.  Where the edge of each step is.  Use tools that help you move around (mobility aids) if they are needed. These include:  Canes.  Walkers.  Scooters.  Crutches.  Turn on the lights when you go into a dark area. Replace any light bulbs as soon as they burn out.  Set up your furniture so you have a clear path. Avoid moving your furniture around.  If any of your floors are uneven, fix them.  If there are any pets around you, be aware of where they are.  Review your medicines with your doctor. Some medicines can make you feel dizzy. This can increase your chance of falling. Ask your doctor what other things that you can do to help prevent falls. This information is not intended to replace advice given to you by your health care provider. Make sure you discuss any questions you have with your health care provider. Document Released: 01/26/2009 Document Revised: 09/07/2015 Document Reviewed: 05/06/2014 Elsevier Interactive Patient Education  2017 [employer].

## 2020-04-25 NOTE — Progress Notes (Signed)
Subjective:   Richard Middleton is a 75 y.o. male who presents for Medicare Annual/Subsequent preventive examination.  Attempted to connect to virtual visit multiple times. Video visit never connected. Visit changed to a telephone visit   I connected with Richard Middleton  today by telephone and verified that I am speaking with the correct person using two identifiers. Location patient: home Location provider: work Persons participating in the virtual visit: patient, provider.   I discussed the limitations, risks, security and privacy concerns of performing an evaluation and management service by telephone and the availability of in person appointments. I also discussed with the patient that there may be a patient responsible charge related to this service. The patient expressed understanding and verbally consented to this telephonic visit.    Interactive audio and video telecommunications were attempted between this provider and patient, however failed, due to patient having technical difficulties OR patient did not have access to video capability.  We continued and completed visit with audio only.      Review of Systems    N/A  Cardiac Risk Factors include: advanced age (>7men, >13 women);male gender     Objective:    Today's Vitals   04/25/20 1123  Weight: 168 lb 5 oz (76.3 kg)   Body mass index is 22.21 kg/m.  Advanced Directives 04/25/2020 04/06/2019 10/05/2018 10/05/2018 05/18/2018 05/02/2017 09/30/2016  Does Patient Have a Medical Advance Directive? Yes Yes No No No No No  Type of Paramedic of [place];Living will - - - - - -  Does patient want to make changes to medical advance directive? No - Patient declined No - Patient declined - - - - -  Copy of [place] in Chart? No - copy requested - - - - - -  Would patient like information on creating a medical advance directive? - - No - Patient declined - No - Patient declined No - Patient  declined -    Current Medications (verified) Outpatient Encounter Medications as of 04/25/2020  Medication Sig  . VITAMIN D PO Take 500 mg by mouth daily.   Marland Kitchen omeprazole (PRILOSEC) 40 MG capsule Take 1 capsule (40 mg total) by mouth daily. (Patient not taking: Reported on 04/25/2020)  . tadalafil (CIALIS) 5 MG tablet Take 1 tablet (5 mg total) by mouth daily. (Patient not taking: Reported on 04/25/2020)   No facility-administered encounter medications on file as of 04/25/2020.    Allergies (verified) Patient has no known allergies.   History: Past Medical History:  Diagnosis Date  . Abnormal glucose   . History of small bowel obstruction    x2  . Pneumonia    as a child   Past Surgical History:  Procedure Laterality Date  . ACHILLES TENDON SURGERY Right 05/02/2017   Procedure: RIGHT ACHILLES RECONSTRUCTION;  Surgeon: Newt Minion, MD;  Location: [facility];  Service: Orthopedics;  Laterality: Right;  . COLONOSCOPY  2008  . LAPAROSCOPIC SMALL BOWEL RESECTION  3 1/2 years ago, & 10 years ago   x2  . LAPAROTOMY N/A 07/13/2014   Procedure: EXPLORATORY LAPAROTOMY lysis of adhesions;  Surgeon: Leighton Ruff, MD;  Location: [facility];  Service: General;  Laterality: N/A;  . LUMBAR LAMINECTOMY/DECOMPRESSION MICRODISCECTOMY N/A 05/18/2018   Procedure: L4-5 DECOMPRESSION, RIGHT MICRODISCECTOMY;  Surgeon: Marybelle Killings, MD;  Location: [facility];  Service: Orthopedics;  Laterality: N/A;   Family History  Problem Relation Age of Onset  . Arthritis Mother   .  Pulmonary embolism Father   . Cancer Neg Hx   . Diabetes Neg Hx   . Colon cancer Neg Hx    Social History   Socioeconomic History  . Marital status: Married    Spouse name: Not on file  . Number of children: 1  . Years of education: 6  . Highest education level: Associate degree: occupational, Scientist, product/process development, or vocational program  Occupational History  . Occupation: retired  Tobacco Use  . Smoking status: Former Smoker    Packs/day: 4.00     Types: Cigarettes    Start date: 04/15/1966    Quit date: 04/15/1970    Years since quitting: 50.0  . Smokeless tobacco: Never Used  Vaping Use  . Vaping Use: Never used  Substance and Sexual Activity  . Alcohol use: Yes    Alcohol/week: 1.0 standard drink    Types: 1 Glasses of wine per week    Comment: 2-3 times per month   . Drug use: No  . Sexual activity: Not on file  Other Topics Concern  . Not on file  Social History Narrative  . Not on file   Social Determinants of Health   Financial Resource Strain: Low Risk   . Difficulty of Paying Living Expenses: Not hard at all  Food Insecurity: No Food Insecurity  . Worried About Programme researcher, broadcasting/film/video in the Last Year: Never true  . Ran Out of Food in the Last Year: Never true  Transportation Needs: No Transportation Needs  . Lack of Transportation (Medical): No  . Lack of Transportation (Non-Medical): No  Physical Activity: Sufficiently Active  . Days of Exercise per Week: 5 days  . Minutes of Exercise per Session: 40 min  Stress: No Stress Concern Present  . Feeling of Stress : Not at all  Social Connections: Moderately Isolated  . Frequency of Communication with Friends and Family: Twice a week  . Frequency of Social Gatherings with Friends and Family: Once a week  . Attends Religious Services: Never  . Active Member of Clubs or Organizations: No  . Attends Banker Meetings: Never  . Marital Status: Married    Tobacco Counseling Counseling given: Not Answered   Clinical Intake:  Pre-visit preparation completed: Yes  Pain : No/denies pain     Nutritional Risks: None Diabetes: No  How often do you need to have someone help you when you read instructions, pamphlets, or other written materials from your doctor or pharmacy?: 1 - Never What is the last grade level you completed in school?: College  Diabetic?No   Interpreter Needed?: No  Information entered by :: SCrews,LPN   Activities of Daily  Living In your present state of health, do you have any difficulty performing the following activities: 04/25/2020  Hearing? N  Vision? N  Difficulty concentrating or making decisions? N  Walking or climbing stairs? N  Dressing or bathing? N  Doing errands, shopping? N  Preparing Food and eating ? N  Using the Toilet? N  In the past six months, have you accidently leaked urine? N  Do you have problems with loss of bowel control? N  Managing your Medications? N  Managing your Finances? N  Housekeeping or managing your Housekeeping? N  Some recent data might be hidden    Patient Care Team: [place], Betty G, MD as PCP - General (Family Medicine)  Indicate any recent Medical Services you may have received from other than [facility] providers in the past year (date may  be approximate).     Assessment:   This is a routine wellness examination for [place].  Hearing/Vision screen  Hearing Screening   125Hz  250Hz  500Hz  1000Hz  2000Hz  3000Hz  4000Hz  6000Hz  8000Hz   Right ear:           Left ear:           Vision Screening Comments: Patient states it has been 1.5 years since he has seen and eye doctor   Dietary issues and exercise activities discussed: Current Exercise Habits: Home exercise routine, Type of exercise: walking, Time (Minutes): 35, Frequency (Times/Week): 5, Weekly Exercise (Minutes/Week): 175, Intensity: Mild, Exercise limited by: None identified  Goals    . Increase physical activity     By increasing intensity and adding light weights while walking.    . Patient Stated     I will continue to walk 2-3 miles per day     . Patient Stated     I would like  to start back running!       Depression Screen PHQ 2/9 Scores 04/25/2020 04/06/2019 01/23/2018 08/27/2017 12/22/2015 07/01/2014 02/19/2013  PHQ - 2 Score 0 1 0 0 0 0 0  PHQ- 9 Score 0 - - - - - -    Fall Risk Fall Risk  04/25/2020 12/03/2019 01/23/2018 08/27/2017 12/22/2015  Falls in the past year? 0 0 No No No  Number falls in  past yr: 0 0 - - -  Injury with Fall? 0 0 - - -  Risk for fall due to : No Fall Risks - - - -  Follow up Falls evaluation completed;Falls prevention discussed Education provided - - -    FALL RISK PREVENTION PERTAINING TO THE HOME:  Any stairs in or around the home? Yes  If so, are there any without handrails? No  Home free of loose throw rugs in walkways, pet beds, electrical cords, etc? Yes  Adequate lighting in your home to reduce risk of falls? Yes   ASSISTIVE DEVICES UTILIZED TO PREVENT FALLS:  Life alert? No  Use of a cane, walker or w/c? No  Grab bars in the bathroom? No  Shower chair or bench in shower? No  Elevated toilet seat or a handicapped toilet? No     Cognitive Function: Normal cognitive status assessed by direct observation by this Nurse Health Advisor. No abnormalities found.   MMSE - Mini Mental State Exam 12/22/2015  Not completed: (No Data)     6CIT Screen 04/06/2019  What month? 0 points  What time? 0 points  Count back from 20 0 points  Months in reverse 0 points  Repeat phrase 0 points    Immunizations Immunization History  Administered Date(s) Administered  . Fluad Quad(high Dose 65+) 02/10/2020  . Influenza Split 01/24/2012  . Influenza Whole 01/12/2007, 01/05/2008, 02/01/2009, 01/09/2010  . Influenza, High Dose Seasonal PF 12/22/2015, 01/19/2018  . Influenza,inj,Quad PF,6+ Mos 01/11/2013  . Influenza-Unspecified 01/19/2018, 01/20/2019  . PFIZER SARS-COV-2 Vaccination 05/20/2019, 06/10/2019  . Pneumococcal Conjugate-13 08/18/2015  . Pneumococcal Polysaccharide-23 01/24/2012, 08/27/2017, 02/10/2020  . Td 08/01/2006  . Zoster 07/21/2007  . Zoster Recombinat (Shingrix) 01/20/2019    TDAP status: Due, Education has been provided regarding the importance of this vaccine. Advised may receive this vaccine at local pharmacy or Health Dept. Aware to provide a copy of the vaccination record if obtained from local pharmacy or Health Dept. Verbalized  acceptance and understanding.  Flu Vaccine status: Up to date  Pneumococcal vaccine status: Up to date  Covid-19 vaccine status: Completed vaccines  Qualifies for Shingles Vaccine? Yes   Zostavax completed Yes   Shingrix Completed?: Yes  Screening Tests Health Maintenance  Topic Date Due  . COVID-19 Vaccine (3 - Pfizer risk 4-dose series) 07/08/2019  . TETANUS/TDAP  01/23/2021 (Originally 07/31/2016)  . COLONOSCOPY (Pts 45-19yrs Insurance coverage will need to be confirmed)  10/31/2021  . INFLUENZA VACCINE  Completed  . Hepatitis C Screening  Completed  . PNA vac Low Risk Adult  Completed    Health Maintenance  Health Maintenance Due  Topic Date Due  . COVID-19 Vaccine (3 - Pfizer risk 4-dose series) 07/08/2019    Colorectal cancer screening: Type of screening: Colonoscopy. Completed 10/31/2016. Repeat every 5 years  Lung Cancer Screening: (Low Dose CT Chest recommended if Age 38-80 years, 30 pack-year currently smoking OR have quit w/in 15years.) does not qualify.   Lung Cancer Screening Referral: N/A   Additional Screening:  Hepatitis C Screening: does qualify; Completed 01/23/2018  Vision Screening: Recommended annual ophthalmology exams for early detection of glaucoma and other disorders of the eye. Is the patient up to date with their annual eye exam?  No  Who is the provider or what is the name of the office in which the patient attends annual eye exams? Patient does not have an eye doctor  If pt is not established with a provider, would they like to be referred to a provider to establish care? No .   Dental Screening: Recommended annual dental exams for proper oral hygiene  Community Resource Referral / Chronic Care Management: CRR required this visit?  No   CCM required this visit?  No      Plan:     I have personally reviewed and noted the following in the patient's chart:   . Medical and social history . Use of alcohol, tobacco or illicit drugs   . Current medications and supplements . Functional ability and status . Nutritional status . Physical activity . Advanced directives . List of other physicians . Hospitalizations, surgeries, and ER visits in previous 12 months . Vitals . Screenings to include cognitive, depression, and falls . Referrals and appointments  In addition, I have reviewed and discussed with patient certain preventive protocols, quality metrics, and best practice recommendations. A written personalized care plan for preventive services as well as general preventive health recommendations were provided to patient.     Theodora Blow, LPN   04/16/5850   Nurse Notes: None

## 2020-10-20 IMAGING — DX DG CHEST 2V
2 series · 2 of 2 positions shown · non-contrast
Comparison: May 18, 2009.

CLINICAL DATA: Cough.

EXAM:
CHEST - 2 VIEW

[chest pa]
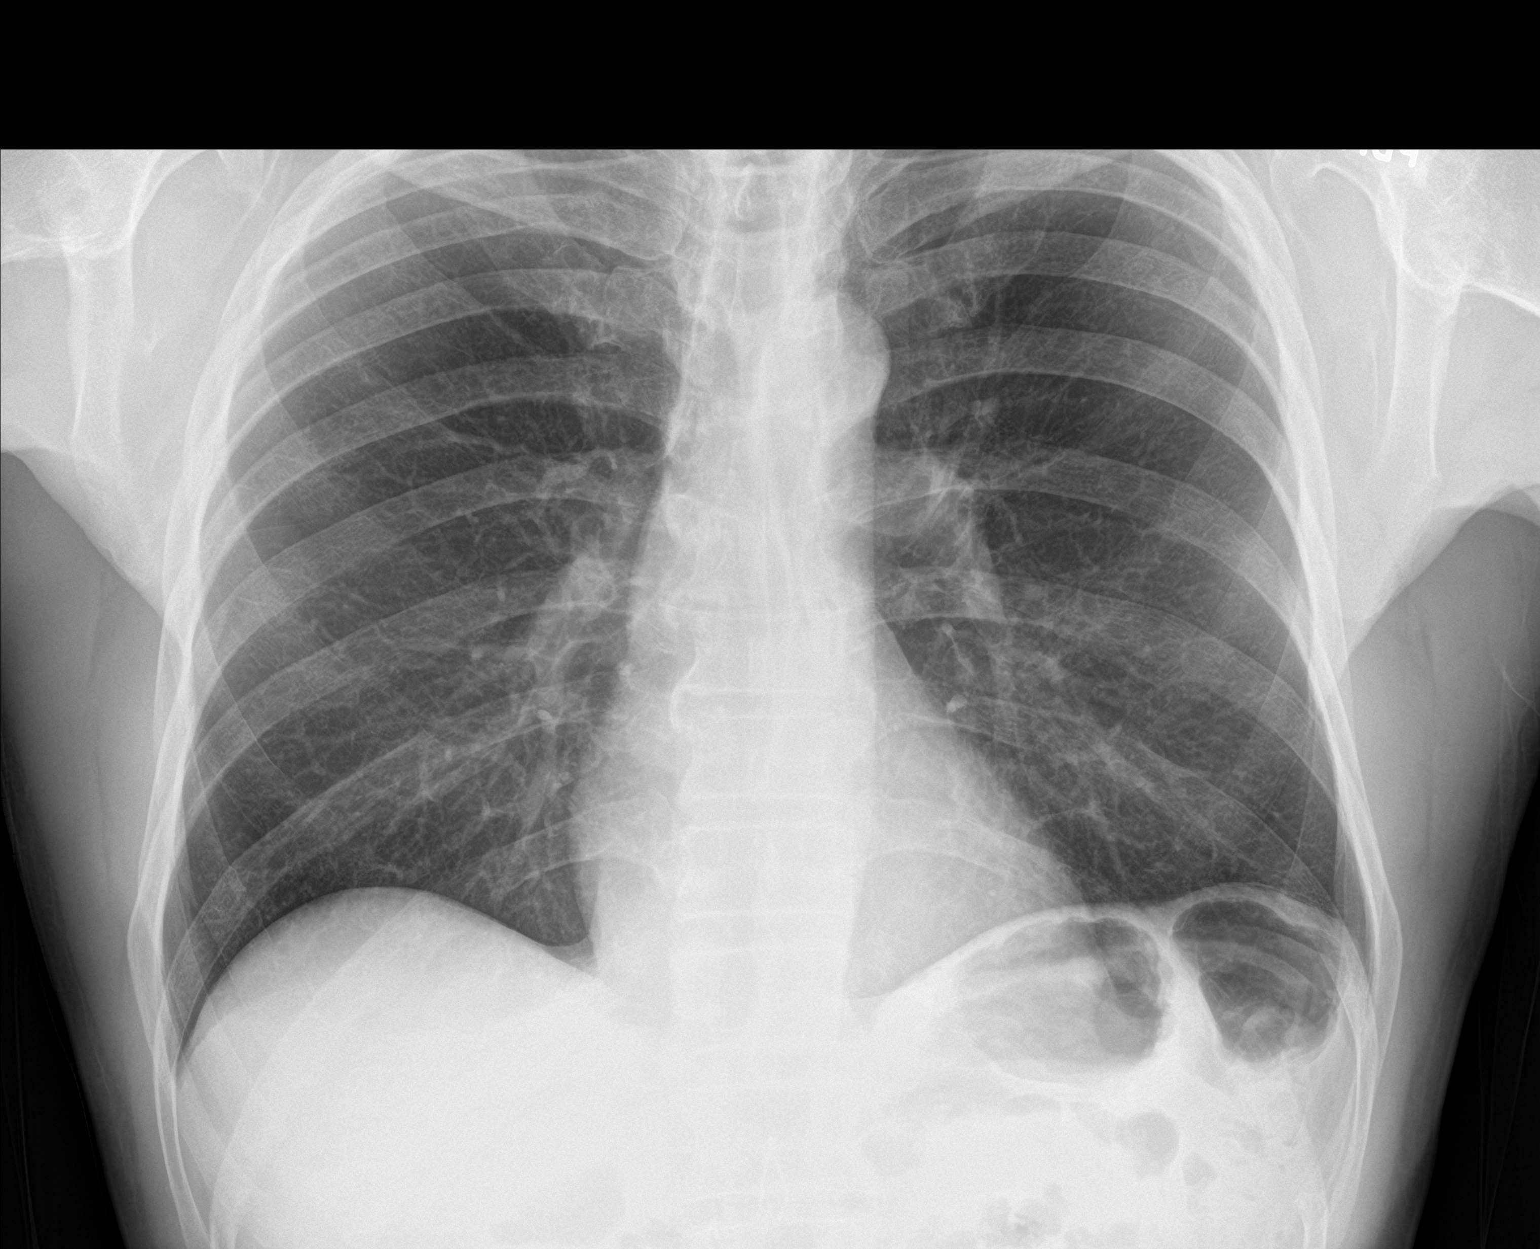

[chest lat]
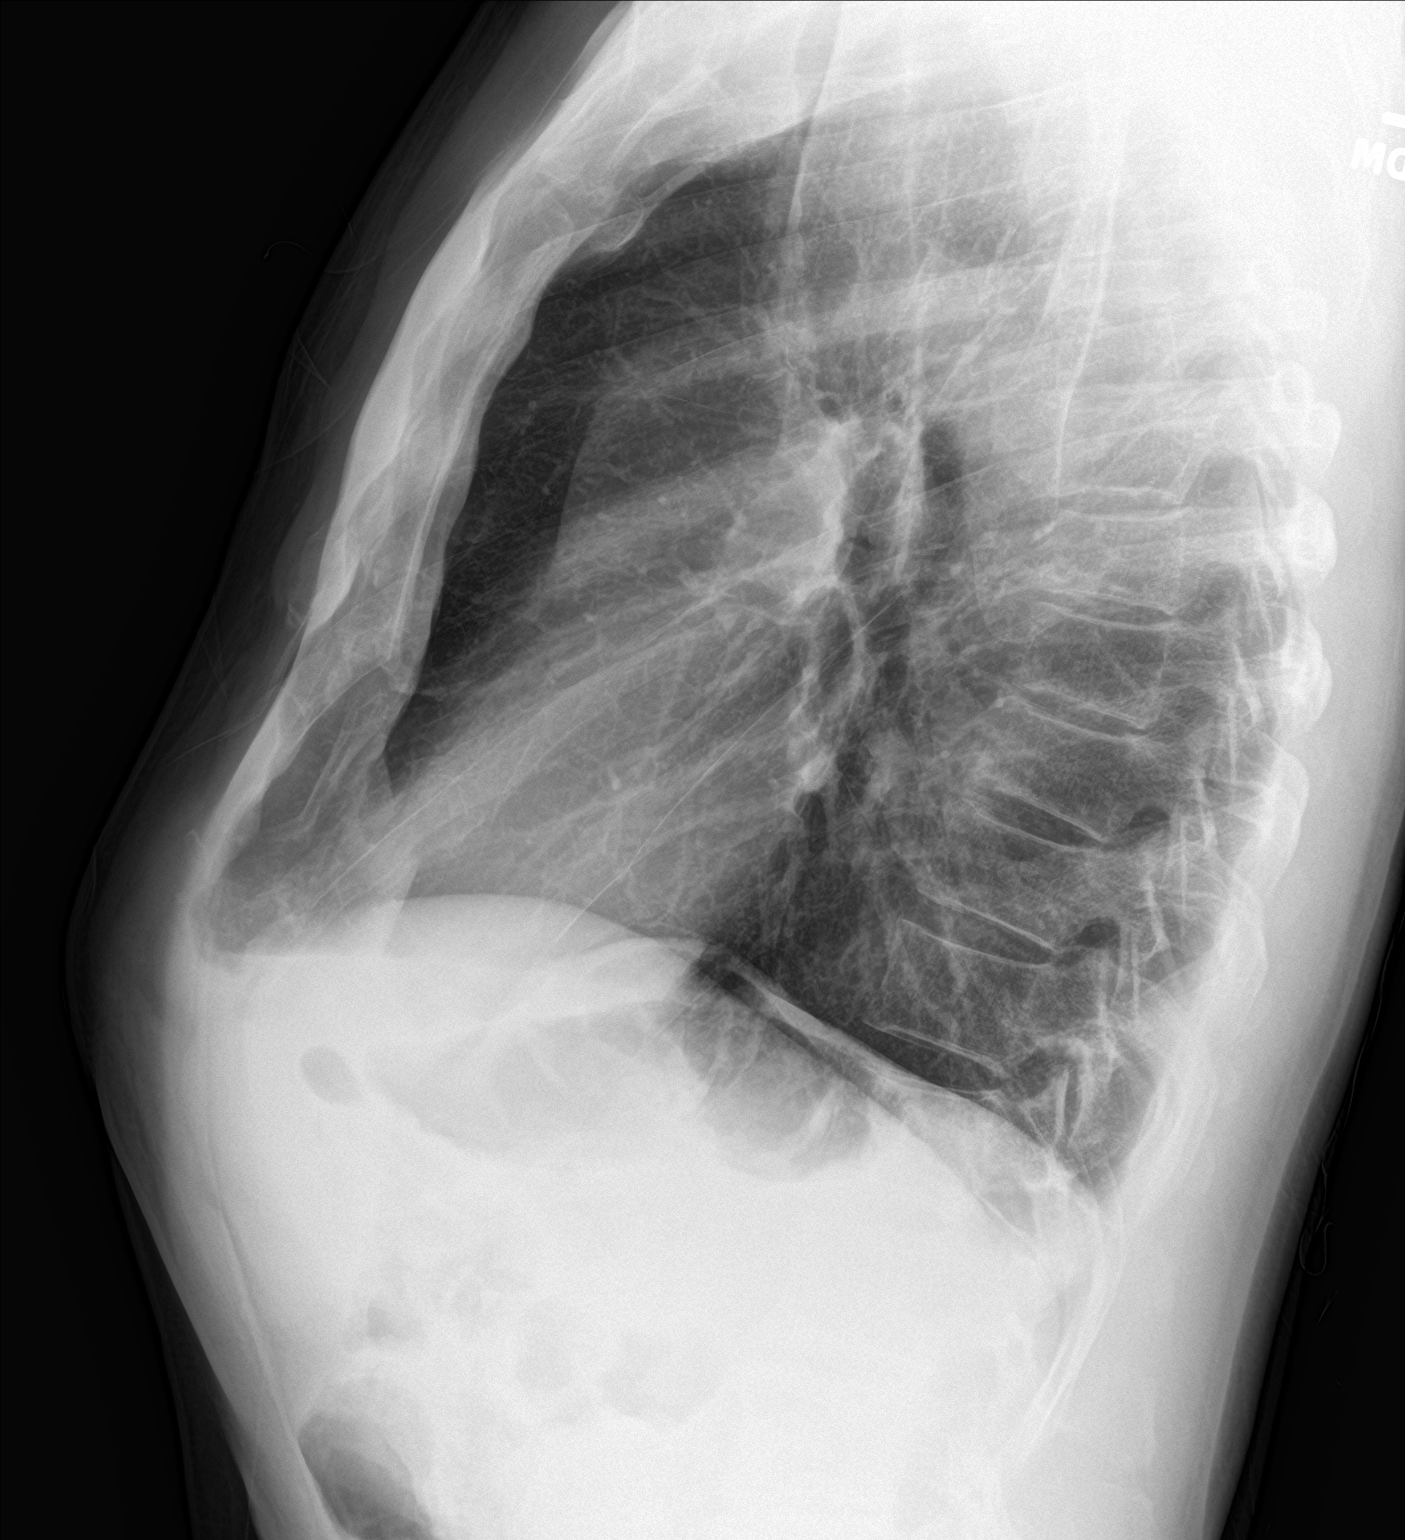

[2 of 2 positions shown; findings below may reference images not displayed]

FINDINGS: The heart size and mediastinal contours are within normal limits.
Both lungs are clear. The visualized skeletal structures are
unremarkable.
IMPRESSION: No active cardiopulmonary disease.

## 2020-10-26 ENCOUNTER — Encounter: Payer: Self-pay | Admitting: Family Medicine

## 2020-12-05 ENCOUNTER — Encounter: Payer: Medicare Other | Admitting: Family Medicine

## 2020-12-21 ENCOUNTER — Other Ambulatory Visit: Payer: Self-pay

## 2020-12-21 NOTE — Progress Notes (Signed)
HPI: Richard Middleton is a 75 y.o.male here today for his routine physical examination.  Last CPE: 12/03/19 He lives with wife and 76 yo son.  Regular exercise 3 or more times per week: 2.5-3 miles daily.Yard work and gardening. Following a healthful diet: Yes. He eats pork once per week, decreased sweets to weekends 2-3 months ago.  Chronic medical problems: Aortic atherosclerosis seen on abdominal CT, ED and BPH.  Immunization History  Administered Date(s) Administered   Fluad Quad(high Dose 65+) 02/10/2020, 12/22/2020   Influenza Split 01/24/2012   Influenza Whole 01/12/2007, 01/05/2008, 02/01/2009, 01/09/2010   Influenza, High Dose Seasonal PF 12/22/2015, 01/19/2018   Influenza,inj,Quad PF,6+ Mos 01/11/2013   Influenza-Unspecified 01/19/2018, 01/20/2019   PFIZER(Purple Top)SARS-COV-2 Vaccination 05/20/2019, 06/10/2019   Pneumococcal Conjugate-13 08/18/2015   Pneumococcal Polysaccharide-23 01/24/2012, 08/27/2017, 02/10/2020   Td 08/01/2006   Zoster Recombinat (Shingrix) 01/20/2019   Zoster, Live 07/21/2007   Hep C screening:08/2016 NR  Last colon cancer screening: 10/31/16. Last prostate ca screening:  Nocturia x 1-2. Lab Results  Component Value Date   PSA 0.2 12/03/2019   PSA 0.28 11/20/2018   PSA 0.41 01/23/2018   Former smoker:  Aorta US negative in 01/2018.  -Concerns and/or follow up today:  Prediabetes: Negative for abdominal pain,polydipsia,polyuria, or polyphagia.  Lab Results  Component Value Date   HGBA1C 5.9 (H) 12/03/2019   Aortic atherosclerosis seen on abdominal imaging, 09/2018.  Lab Results  Component Value Date   CHOL 128 12/03/2019   HDL 58 12/03/2019   LDLCALC 54 12/03/2019   TRIG 75 12/03/2019   CHOLHDL 2.2 12/03/2019    Review of Systems  Constitutional:  Negative for activity change, appetite change, fatigue and fever.  HENT:  Negative for facial swelling, nosebleeds, sore throat and trouble swallowing.   Eyes:  Negative for  redness and visual disturbance.  Respiratory:  Negative for cough, shortness of breath and wheezing.   Cardiovascular:  Negative for chest pain, palpitations and leg swelling.  Gastrointestinal:  Negative for blood in stool, nausea and vomiting.  Endocrine: Negative for cold intolerance and heat intolerance.  Genitourinary:  Negative for decreased urine volume, dysuria, genital sores, hematuria and testicular pain.  Musculoskeletal:  Positive for arthralgias and back pain. Negative for gait problem.  Skin:  Negative for color change and rash.  Neurological:  Negative for syncope, weakness and headaches.  Hematological:  Negative for adenopathy. Does not bruise/bleed easily.  Psychiatric/Behavioral:  Negative for confusion and sleep disturbance.    Current Outpatient Medications on File Prior to Visit  Medication Sig Dispense Refill   VITAMIN D PO Take 500 mg by mouth daily.      No current facility-administered medications on file prior to visit.   Past Medical History:  Diagnosis Date   Abnormal glucose    History of small bowel obstruction    x2   Pneumonia    as a child   Past Surgical History:  Procedure Laterality Date   ACHILLES TENDON SURGERY Right 05/02/2017   Procedure: RIGHT ACHILLES RECONSTRUCTION;  Surgeon: Newt Minion, MD;  Location: [facility];  Service: Orthopedics;  Laterality: Right;   COLONOSCOPY  2008   LAPAROSCOPIC SMALL BOWEL RESECTION  3 1/2 years ago, & 10 years ago   x2   LAPAROTOMY N/A 07/13/2014   Procedure: EXPLORATORY LAPAROTOMY lysis of adhesions;  Surgeon: Leighton Ruff, MD;  Location: [facility];  Service: General;  Laterality: N/A;   LUMBAR LAMINECTOMY/DECOMPRESSION MICRODISCECTOMY N/A 05/18/2018   Procedure: L4-5  DECOMPRESSION, RIGHT MICRODISCECTOMY;  Surgeon: Marybelle Killings, MD;  Location: [facility];  Service: Orthopedics;  Laterality: N/A;   No Known Allergies  Family History  Problem Relation Age of Onset   Arthritis Mother    Pulmonary embolism Father     Cancer Neg Hx    Diabetes Neg Hx    Colon cancer Neg Hx    Social History   Socioeconomic History   Marital status: Married    Spouse name: Not on file   Number of children: 1   Years of education: 6   Highest education level: Associate degree: occupational, Hotel manager, or vocational program  Occupational History   Occupation: retired  Tobacco Use   Smoking status: Former    Packs/day: 4.00    Types: Cigarettes    Start date: 04/15/1966    Quit date: 04/15/1970    Years since quitting: 50.7   Smokeless tobacco: Never  Vaping Use   Vaping Use: Never used  Substance and Sexual Activity   Alcohol use: Yes    Alcohol/week: 1.0 standard drink    Types: 1 Glasses of wine per week    Comment: 2-3 times per month    Drug use: No   Sexual activity: Not on file  Other Topics Concern   Not on file  Social History Narrative   Not on file   Social Determinants of Health   Financial Resource Strain: Low Risk    Difficulty of Paying Living Expenses: Not hard at all  Food Insecurity: No Food Insecurity   Worried About Charity fundraiser in the Last Year: Never true   [place] in the Last Year: Never true  Transportation Needs: No Transportation Needs   Lack of Transportation (Medical): No   Lack of Transportation (Non-Medical): No  Physical Activity: Sufficiently Active   Days of Exercise per Week: 5 days   Minutes of Exercise per Session: 40 min  Stress: No Stress Concern Present   Feeling of Stress : Not at all  Social Connections: Moderately Isolated   Frequency of Communication with Friends and Family: Twice a week   Frequency of Social Gatherings with Friends and Family: Once a week   Attends Religious Services: Never   Marine scientist or Organizations: No   Attends Archivist Meetings: Never   Marital Status: Married   Vitals:   12/22/20 0855  BP: 128/80  Pulse: 82  Resp: 16  Temp: 97.9 F (36.6 C)  SpO2: 98%   Body mass index is 21.79  kg/m.  Wt Readings from Last 3 Encounters:  12/22/20 165 lb 2 oz (74.9 kg)  04/25/20 168 lb 5 oz (76.3 kg)  12/03/19 167 lb 4 oz (75.9 kg)   Physical Exam Vitals and nursing note reviewed.  Constitutional:      General: He is not in acute distress.    Appearance: He is well-developed and normal weight.  HENT:     Head: Normocephalic and atraumatic.     Right Ear: Tympanic membrane, ear canal and external ear normal.     Left Ear: Tympanic membrane, ear canal and external ear normal.     Mouth/Throat:     Mouth: Mucous membranes are moist.     Pharynx: Oropharynx is clear.  Eyes:     Extraocular Movements: Extraocular movements intact.     Conjunctiva/sclera: Conjunctivae normal.     Pupils: Pupils are equal, round, and reactive to light.  Neck:  Thyroid: No thyromegaly.     Trachea: No tracheal deviation.  Cardiovascular:     Rate and Rhythm: Normal rate and regular rhythm.     Pulses:          Dorsalis pedis pulses are 2+ on the right side and 2+ on the left side.     Heart sounds: No murmur heard. Pulmonary:     Effort: Pulmonary effort is normal. No respiratory distress.     Breath sounds: Normal breath sounds.  Abdominal:     Palpations: Abdomen is soft. There is no hepatomegaly or mass.     Tenderness: There is no abdominal tenderness.  Genitourinary:    Comments: No concerns. Musculoskeletal:        General: No tenderness.     Cervical back: Normal range of motion.     Comments: No signs of synovitis.  Lymphadenopathy:     Cervical: No cervical adenopathy.     Upper Body:     Right upper body: No supraclavicular adenopathy.     Left upper body: No supraclavicular adenopathy.  Skin:    General: Skin is warm.     Findings: No erythema.     Comments: No suspicious lesions, SK scattered on back and upper chest.  Neurological:     Mental Status: He is alert and oriented to person, place, and time.     Cranial Nerves: No cranial nerve deficit.     Sensory:  No sensory deficit.     Coordination: Coordination normal.     Gait: Gait normal.     Deep Tendon Reflexes:     Reflex Scores:      Bicep reflexes are 2+ on the right side and 2+ on the left side.      Patellar reflexes are 2+ on the right side and 2+ on the left side.  ASSESSMENT AND PLAN:  RichardRichard Middleton was seen today for annual exam.  Diagnoses and all orders for this visit: Orders Placed This Encounter  Procedures   Flu Vaccine QUAD High Dose(Fluad)   PSA(Must document that pt has been informed of limitations of PSA testing.)   Basic metabolic panel   Hemoglobin A1c   Lipid panel   Lab Results  Component Value Date   HGBA1C 5.9 12/22/2020   Lab Results  Component Value Date   CREATININE 1.05 12/22/2020   BUN 15 12/22/2020   NA 139 12/22/2020   K 4.0 12/22/2020   CL 104 12/22/2020   CO2 27 12/22/2020   Lab Results  Component Value Date   CHOL 145 12/22/2020   HDL 56.40 12/22/2020   LDLCALC 78 12/22/2020   TRIG 54.0 12/22/2020   CHOLHDL 3 12/22/2020   Routine general medical examination at a health care facility We discussed the importance of regular physical activity and healthy diet for prevention of chronic illness and/or complications. Preventive guidelines reviewed. Vaccination updated. Fall precautions discussed. Next CPE in a year. The 10-year ASCVD risk score (Arnett DK, et al., 2019) is: 11.8%   Values used to calculate the score:     Age: 36 years     Sex: Male     Is Non-Hispanic African American: Yes     Diabetic: No     Tobacco smoker: No     Systolic Blood Pressure: 0000000 mmHg     Is BP treated: No     HDL Cholesterol: 56.4 mg/dL     Total Cholesterol: 145 mg/dL  Prediabetes Continue a healthy life style for  diabetes prevention.  BPH associated with nocturia Stable. He would like PSA check.  Atherosclerosis of aorta (HCC) Seen on imaging. He is not on statin medication at this time. Further recommendations according to FLP  result.  Erectile dysfunction, unspecified erectile dysfunction type He is not longer on Cialis. F/U as needed.  Need for influenza vaccination -     Flu Vaccine QUAD High Dose(Fluad)  Return in 1 year (on 12/22/2021) for cpe.  Pavielle Biggar G. Martinique, MD  [facility]. [place] office.

## 2020-12-22 ENCOUNTER — Encounter: Payer: Self-pay | Admitting: Family Medicine

## 2020-12-22 ENCOUNTER — Ambulatory Visit (INDEPENDENT_AMBULATORY_CARE_PROVIDER_SITE_OTHER): Payer: Medicare Other | Admitting: Family Medicine

## 2020-12-22 VITALS — BP 128/80 | HR 82 | Temp 97.9°F | Resp 16 | Ht 73.0 in | Wt 165.1 lb

## 2020-12-22 DIAGNOSIS — N401 Enlarged prostate with lower urinary tract symptoms: Secondary | ICD-10-CM | POA: Diagnosis not present

## 2020-12-22 DIAGNOSIS — Z0000 Encounter for general adult medical examination without abnormal findings: Secondary | ICD-10-CM

## 2020-12-22 DIAGNOSIS — I700 Atherosclerosis of aorta: Secondary | ICD-10-CM | POA: Diagnosis not present

## 2020-12-22 DIAGNOSIS — Z23 Encounter for immunization: Secondary | ICD-10-CM

## 2020-12-22 DIAGNOSIS — R351 Nocturia: Secondary | ICD-10-CM | POA: Diagnosis not present

## 2020-12-22 DIAGNOSIS — N529 Male erectile dysfunction, unspecified: Secondary | ICD-10-CM

## 2020-12-22 DIAGNOSIS — R7303 Prediabetes: Secondary | ICD-10-CM

## 2020-12-22 LAB — BASIC METABOLIC PANEL
BUN: 15 mg/dL (ref 6–23)
CO2: 27 mEq/L (ref 19–32)
Calcium: 9.5 mg/dL (ref 8.4–10.5)
Chloride: 104 mEq/L (ref 96–112)
Creatinine, Ser: 1.05 mg/dL (ref 0.40–1.50)
GFR: 70.02 mL/min (ref >60.00)
Glucose, Bld: 84 mg/dL (ref 70–99)
Potassium: 4 mEq/L (ref 3.5–5.1)
Sodium: 139 mEq/L (ref 135–145)

## 2020-12-22 LAB — LIPID PANEL
Cholesterol: 145 mg/dL (ref 0–200)
HDL: 56.4 mg/dL (ref >39.00)
LDL Cholesterol: 78 mg/dL (ref 0–99)
NonHDL: 88.45
Total CHOL/HDL Ratio: 3
Triglycerides: 54 mg/dL (ref 0.0–149.0)
VLDL: 10.8 mg/dL (ref 0.0–40.0)

## 2020-12-22 LAB — HEMOGLOBIN A1C: Hgb A1c MFr Bld: 5.9 % (ref 4.6–6.5)

## 2020-12-22 LAB — PSA: PSA: 0.41 ng/mL (ref 0.10–4.00)

## 2020-12-22 NOTE — Patient Instructions (Addendum)
A few things to remember from today's visit:   Routine general medical examination at a health care facility  Prediabetes - Plan: Basic metabolic panel, Hemoglobin A1c  BPH associated with nocturia - Plan: PSA(Must document that pt has been informed of limitations of PSA testing.)  Atherosclerosis of aorta (HCC) - Plan: Lipid panel  Erectile dysfunction, unspecified erectile dysfunction type  Need for influenza vaccination - Plan: Flu Vaccine QUAD High Dose(Fluad)  Do not use My Chart to request refills or for acute issues that need immediate attention.   Please be sure medication list is accurate. If a new problem present, please set up appointment sooner than planned today.  At least 150 minutes of moderate exercise per week, daily brisk walking for 15-30 min is a good exercise option. Healthy diet low in saturated (animal) fats and sweets and consisting of fresh fruits and vegetables, lean meats such as fish and white chicken and whole grains.  - Vaccines:  Tdap vaccine every 10 years.  Shingles vaccine recommended at age 27, could be given after 74 years of age but not sure about insurance coverage.  Pneumonia vaccines: Pneumovax at 26  -Screening recommendations for low/normal risk males:  Screening for diabetes at age 58 and every 3 years. Earlier screening if cardiovascular risk factors.  Lipid screening at 35 and every 3 years. Screening starts in younger males with cardiovascular risk factors.  Colon cancer screening is now at age 74 but your insurance may not cover until age 39 .screening is recommended age 43.  Prostate cancer screening: some controversy, starts usually at 82: Rectal exam and PSA.  Aortic Abdominal Aneurism once between 10 and 68 years old if ever smoker.  Also recommended:  Dental visit- Brush and floss your teeth twice daily; visit your dentist twice a year. Eye doctor- Get an eye exam at least every 2 years. Helmet use- Always wear a  helmet when riding a bicycle, motorcycle, rollerblading or skateboarding. Safe sex- If you may be exposed to sexually transmitted infections, use a condom. Seat belts- Seat belts can save your live; always wear one. Smoke/Carbon Monoxide detectors- These detectors need to be installed on the appropriate level of your home. Replace batteries at least once a year. Skin cancer- When out in the sun please cover up and use sunscreen 15 SPF or higher. Violence- If anyone is threatening or hurting you, please tell your healthcare provider.  Drink alcohol in moderation- Limit alcohol intake to one drink or less per day. Never drink and drive.

## 2020-12-27 ENCOUNTER — Other Ambulatory Visit: Payer: Self-pay

## 2020-12-27 MED ORDER — LOVASTATIN 10 MG PO TABS: 10.0000 mg | ORAL_TABLET | Freq: Every day | ORAL | 3 refills | Status: DC

## 2020-12-29 ENCOUNTER — Telehealth: Payer: Self-pay

## 2020-12-29 NOTE — Telephone Encounter (Signed)
Patient called requesting a call back for clarification on the lovastatin (MEVACOR) 10 MG tablet

## 2020-12-29 NOTE — Telephone Encounter (Signed)
I left patient a voicemail to return my call. 

## 2021-01-02 NOTE — Telephone Encounter (Signed)
I called and spoke with patient. We went over his questions & they were answered. He will call back if he has any further questions.

## 2021-03-23 NOTE — Progress Notes (Signed)
HPI: Richard Middleton is a 75 y.o. male, who is here today for 3 months follow up.   He was last seen on 12/22/20. Aortic atherosclerosis seen on CT of abdomen/pelvis in 09/2018. Last visit Lovastatin 10 mg was started. Negative for CP,SOB,palpitations,orthopnea,PND,edema,or diaphoresis.  Following a low fat diet: Yes.. Side effects from medication:None  Lab Results  Component Value Date   CHOL 145 12/22/2020   HDL 56.40 12/22/2020   LDLCALC 78 12/22/2020   TRIG 54.0 12/22/2020   CHOLHDL 3 12/22/2020   Review of Systems  Constitutional:  Negative for chills, fatigue and fever.  Respiratory:  Negative for cough, shortness of breath and wheezing.   Gastrointestinal:  Negative for abdominal pain, nausea and vomiting.  Musculoskeletal:  Negative for gait problem and myalgias.  Neurological:  Negative for syncope, weakness and headaches.  Rest of ROS, see pertinent positives sand negatives in HPI  Current Outpatient Medications on File Prior to Visit  Medication Sig Dispense Refill   lovastatin (MEVACOR) 10 MG tablet Take 1 tablet (10 mg total) by mouth at bedtime. 90 tablet 3   VITAMIN D PO Take 500 mg by mouth daily.      No current facility-administered medications on file prior to visit.   Past Medical History:  Diagnosis Date   Abnormal glucose    History of small bowel obstruction    x2   Pneumonia    as a child   No Known Allergies  Social History   Socioeconomic History   Marital status: Married    Spouse name: Not on file   Number of children: 1   Years of education: 6   Highest education level: Associate degree: occupational, Hotel manager, or vocational program  Occupational History   Occupation: retired  Tobacco Use   Smoking status: Former    Packs/day: 4.00    Types: Cigarettes    Start date: 04/15/1966    Quit date: 04/15/1970    Years since quitting: 50.9   Smokeless tobacco: Never  Vaping Use   Vaping Use: Never used  Substance and Sexual Activity    Alcohol use: Yes    Alcohol/week: 1.0 standard drink    Types: 1 Glasses of wine per week    Comment: 2-3 times per month    Drug use: No   Sexual activity: Not on file  Other Topics Concern   Not on file  Social History Narrative   Not on file   Social Determinants of Health   Financial Resource Strain: Low Risk    Difficulty of Paying Living Expenses: Not hard at all  Food Insecurity: No Food Insecurity   Worried About Charity fundraiser in the Last Year: Never true   [place] in the Last Year: Never true  Transportation Needs: No Transportation Needs   Lack of Transportation (Medical): No   Lack of Transportation (Non-Medical): No  Physical Activity: Sufficiently Active   Days of Exercise per Week: 5 days   Minutes of Exercise per Session: 40 min  Stress: No Stress Concern Present   Feeling of Stress : Not at all  Social Connections: Moderately Isolated   Frequency of Communication with Friends and Family: Twice a week   Frequency of Social Gatherings with Friends and Family: Once a week   Attends Religious Services: Never   Marine scientist or Organizations: No   Attends Archivist Meetings: Never   Marital Status: Married   Vitals:   03/26/21  0831  BP: 134/80  Pulse: 68  Resp: 16  Temp: 97.8 F (36.6 C)  SpO2: 99%   Body mass index is 22.71 kg/m.  Physical Exam Vitals and nursing note reviewed.  Constitutional:      General: He is not in acute distress.    Appearance: He is well-developed and normal weight.  HENT:     Head: Normocephalic and atraumatic.  Eyes:     Conjunctiva/sclera: Conjunctivae normal.  Cardiovascular:     Rate and Rhythm: Normal rate and regular rhythm.     Pulses:          Posterior tibial pulses are 2+ on the right side and 2+ on the left side.     Heart sounds: No murmur heard. Pulmonary:     Effort: Pulmonary effort is normal. No respiratory distress.     Breath sounds: Normal breath sounds.   Abdominal:     Palpations: Abdomen is soft. There is no mass.     Tenderness: There is no abdominal tenderness.  Musculoskeletal:     Right lower leg: No edema.     Left lower leg: No edema.  Skin:    General: Skin is warm.     Findings: No erythema or rash.  Neurological:     Mental Status: He is alert and oriented to person, place, and time.     Gait: Gait normal.  Psychiatric:     Comments: Well groomed, good eye contact.   ASSESSMENT AND PLAN:  [name] was seen today for follow up.  Orders Placed This Encounter  Procedures   Lipid panel   Hepatic function panel   Lab Results  Component Value Date   CHOL 112 03/26/2021   HDL 62.80 03/26/2021   LDLCALC 41 03/26/2021   TRIG 42.0 03/26/2021   CHOLHDL 2 03/26/2021   Lab Results  Component Value Date   ALT 17 03/26/2021   AST 18 03/26/2021   ALKPHOS 98 03/26/2021   BILITOT 0.5 03/26/2021   Atherosclerosis of aorta (HCC) We discussed imaging findings, Dx,and prognosis. Tolerating medication well, we reviewed some side effects. Continue Lovastatin 10 mg daily. Further recommendations according to FLP result.  Return in about 41 weeks (around 01/07/2022) for CPE.  Richard Novak G. Martinique, MD  [facility]. [place] office.

## 2021-03-26 ENCOUNTER — Encounter: Payer: Self-pay | Admitting: Family Medicine

## 2021-03-26 ENCOUNTER — Ambulatory Visit (INDEPENDENT_AMBULATORY_CARE_PROVIDER_SITE_OTHER): Payer: Medicare Other | Admitting: Family Medicine

## 2021-03-26 VITALS — BP 134/80 | HR 68 | Temp 97.8°F | Resp 16 | Ht 73.0 in | Wt 172.1 lb

## 2021-03-26 DIAGNOSIS — I700 Atherosclerosis of aorta: Secondary | ICD-10-CM | POA: Diagnosis not present

## 2021-03-26 DIAGNOSIS — Z5181 Encounter for therapeutic drug level monitoring: Secondary | ICD-10-CM | POA: Diagnosis not present

## 2021-03-26 LAB — HEPATIC FUNCTION PANEL
ALT: 17 U/L (ref 0–53)
AST: 18 U/L (ref 0–37)
Albumin: 4 g/dL (ref 3.5–5.2)
Alkaline Phosphatase: 98 U/L (ref 39–117)
Bilirubin, Direct: 0.1 mg/dL (ref 0.0–0.3)
Total Bilirubin: 0.5 mg/dL (ref 0.2–1.2)
Total Protein: 6.9 g/dL (ref 6.0–8.3)

## 2021-03-26 LAB — LIPID PANEL
Cholesterol: 112 mg/dL (ref 0–200)
HDL: 62.8 mg/dL (ref >39.00)
LDL Cholesterol: 41 mg/dL (ref 0–99)
NonHDL: 48.93
Total CHOL/HDL Ratio: 2
Triglycerides: 42 mg/dL (ref 0.0–149.0)
VLDL: 8.4 mg/dL (ref 0.0–40.0)

## 2021-03-26 MED ORDER — LOVASTATIN 10 MG PO TABS: 10.0000 mg | ORAL_TABLET | Freq: Every day | ORAL | 2 refills | Status: DC

## 2021-03-26 NOTE — Assessment & Plan Note (Signed)
We discussed imaging findings, Dx,and prognosis. Tolerating medication well, we reviewed some side effects. Continue Lovastatin 10 mg daily. Further recommendations according to FLP result.

## 2021-03-26 NOTE — Patient Instructions (Addendum)
A few things to remember from today's visit:  Atherosclerosis of aorta ([place]) - Plan: Lipid panel, Hepatic function panel  Encounter for medication monitoring  Continue Lovastatin 10 mg daily and low fat diet.  If you need refills please call your pharmacy. Do not use My Chart to request refills or for acute issues that need immediate attention.   Please be sure medication list is accurate. If a new problem present, please set up appointment sooner than planned today. Atherosclerosis Atherosclerosis is when plaque builds up in the arteries. This causes narrowing and hardening of the arteries. Arteries are blood vessels that carry blood from the heart to all parts of the body. This blood contains oxygen. Plaque occurs due to inflammation or from a buildup of fat, cholesterol, calcium, waste products of cells, and a clotting material in the blood (fibrin). Plaque decreases the amount of blood that can flow through the artery. Atherosclerosis can affect any artery in your body, including: Heart arteries. Damage to these arteries may lead to coronary artery disease, which can cause a heart attack. Brain arteries. Damage to these arteries may cause a stroke. Leg, arm, and pelvis arteries. Peripheral artery disease (PAD) may result from damage to these arteries. Kidney arteries. Kidney (renal) failure may result from damage to kidney arteries. Treatment may slow the disease and prevent further damage to your heart, brain, peripheral arteries, and kidneys. What are the causes? This condition develops slowly over many years. The inner layers of your arteries become damaged and allow the gradual buildup of plaque. The exact cause of atherosclerosis is not fully understood. Symptoms of atherosclerosis do not occur until an artery becomes narrow or blocked. What increases the risk? The following factors may make you more likely to develop this condition: Being middle-aged or older. Certain medical  conditions, including: High blood pressure. High cholesterol. High blood fats (triglycerides). Diabetes. Sleep apnea. Obesity. Certain lab levels, including: Elevated C-reactive protein (CRP). This is a sign of increased inflammation in your body. Elevated homocysteine levels. This is an amino acid that is associated with heart and blood vessel disease. Using tobacco or nicotine products. A family history of atherosclerosis. Not exercising enough (sedentary lifestyle). Being stressed. Drinking too much alcohol or using drugs, such as cocaine or methamphetamine. What are the signs or symptoms? Symptoms of atherosclerosis do not occur until the plaque severely narrows or blocks the artery, which decreases blood flow. Sometimes, atherosclerosis does not cause symptoms. Symptoms of this condition include: Coronary artery disease. This may cause chest pain and shortness of breath. Decreased blood supply to your brain, which may cause a stroke. Signs of a stroke may include sudden: Weakness or numbness in your face, arm, or leg, especially on one side of your body. Trouble walking or difficulty moving your arms or legs. Loss of balance or coordination. Confusion. Slurred speech. Trouble speaking, or trouble understanding speech, or both (aphasia). Vision changes in one or both eyes. This may be double vision, blurred vision, or loss of vision. Severe headache with no known cause. The headache is often described as the worst headache ever experienced. PAD, which may cause pain, numbness, or nonhealing wounds, often in your legs and hips. Renal failure. This may cause tiredness, problems with urination, swelling, and itchy skin. How is this diagnosed? This condition is diagnosed based on your medical history and a physical exam. During the exam, your health care provider will: Check your pulse in different places. Listen for a "whooshing" sound over your arteries (bruit).  You may also have  tests, such as: Blood tests to check your levels of cholesterol, triglycerides, blood sugar, and CRP. Ankle-brachial index to compare blood pressure in your arms to blood pressure in your ankles to see how your blood is flowing. Heart (cardiac) tests. Electrocardiogram (ECG) to check for heart damage. Stress test to see how your heart reacts to exercise. Ultrasound tests. Ultrasound of your peripheral arteries to check blood flow. Echocardiogram to get images of your heart's chambers and valves. X-ray tests. Chest X-ray to see if you have an enlarged heart, which is a sign of heart failure. CT scan to check for damage to your heart, brain, or arteries. Angiogram. This is a test where dye is injected and X-rays are used to see the blood flow in the arteries. How is this treated? This condition is treated with lifestyle changes as the first step. These may include: Changing your diet. Losing weight. Reducing stress. Exercising and being physically active more regularly. Quitting smoking. You may also need medicine to: Lower triglycerides and cholesterol. Control blood pressure. Prevent blood clots. Lower inflammation in your body. Control your blood sugar. Sometimes, surgery is needed to: Remove plaque from an artery (endarterectomy). Open or widen a narrowed heart artery or peripheral artery (angioplasty). Create a new path for your blood with one of these procedures: Heart (coronary) artery bypass graft surgery. Peripheral artery bypass graft surgery. Place a small mesh tube (stent) in an artery to open or widen a narrowed artery. Follow these instructions at home: Eating and drinking  Eat a heart-healthy diet. Talk with your health care provider or a dietitian if you need help. A heart-healthy diet involves: Limiting unhealthy fats and increasing healthy fats. Some examples of healthy fats are avocados and olive oil. Eating plant-based foods, such as fruits, vegetables, nuts,  whole grains, and legumes (such as peas and lentils). If you drink alcohol: Limit how much you have to: 0-1 drink a day for women who are not pregnant. 0-2 drinks a day for men. Know how much alcohol is in a drink. In the [place] one drink equals one 12 oz bottle of beer (355 mL), one 5 oz glass of wine (148 mL), or one 1 oz glass of hard liquor (44 mL). Lifestyle  Maintain a healthy weight. Lose weight if your health care provider says that you need to do that. Follow an exercise program as told by your health care provider. Do not use any products that contain nicotine or tobacco. These products include cigarettes, chewing tobacco, and vaping devices, such as e-cigarettes. If you need help quitting, ask your health care provider. Do not use drugs. General instructions Take over-the-counter and prescription medicines only as told by your health care provider. Manage other health conditions as told. Keep all follow-up visits. This is important. Contact a health care provider if you have: An irregular heartbeat. Unexplained tiredness (fatigue). Trouble urinating, or you are producing less urine or foamy urine. Swelling of your hands or feet, or itchy skin. Unexplained pain or numbness in your legs or hips. A wound that is slow to heal or is not healing. Get help right away if: You have any symptoms of a heart attack. These may be: Chest pain. This includes squeezing chest pain that may feel like indigestion (angina). Shortness of breath. Pain in your neck, jaw, arms, back, or stomach. Cold sweat. Nausea. Light-headedness. Sudden pain, numbness, or coldness in a limb. You have any symptoms of a stroke. "BE FAST" is an  easy way to remember the main warning signs of a stroke: B - Balance. Signs are dizziness, sudden trouble walking, or loss of balance. E - Eyes. Signs are trouble seeing or a sudden change in vision. F - Face. Signs are sudden weakness or numbness of the face, or the  face or eyelid drooping on one side. A - Arms. Signs are weakness or numbness in an arm. This happens suddenly and usually on one side of the body. S - Speech. Signs are sudden trouble speaking, slurred speech, or trouble understanding what people say. T - Time. Time to call emergency services. Write down what time symptoms started. You have other signs of a stroke, such as: A sudden, severe headache with no known cause. Nausea or vomiting. Seizure. These symptoms may represent a serious problem that is an emergency. Do not wait to see if the symptoms will go away. Get medical help right away. Call your local emergency services (911 in the [place] Do not drive yourself to the hospital. Summary Atherosclerosis is when plaque builds up in the arteries and causes narrowing and hardening of the arteries. Plaque occurs due to inflammation or from a buildup of fat, cholesterol, calcium, cellular waste products, and fibrin. This condition may not cause any symptoms. Symptoms of atherosclerosis do not occur until the plaque severely narrows or blocks the artery. Treatment starts with lifestyle changes and may include medicines. In some cases, surgery is needed. Get help right away if you have any symptoms of a heart attack or stroke. This information is not intended to replace advice given to you by your health care provider. Make sure you discuss any questions you have with your health care provider. Document Revised: 07/05/2020 Document Reviewed: 07/05/2020 Elsevier Patient Education  [place].

## 2021-04-26 ENCOUNTER — Ambulatory Visit (INDEPENDENT_AMBULATORY_CARE_PROVIDER_SITE_OTHER): Payer: Medicare Other

## 2021-04-26 VITALS — Ht 73.0 in | Wt 172.0 lb

## 2021-04-26 DIAGNOSIS — Z0000 Encounter for general adult medical examination without abnormal findings: Secondary | ICD-10-CM | POA: Diagnosis not present

## 2021-04-26 NOTE — Progress Notes (Signed)
Subjective:   Richard Middleton is a 76 y.o. male who presents for Medicare Annual/Subsequent preventive examination.  Review of Systems    No  ROS       Objective:    Today's Vitals   04/26/21 1115  Weight: 172 lb (78 kg)  Height: 6\' 1"  (1.854 m)   Body mass index is 22.69 kg/m.  Advanced Directives 04/26/2021 04/25/2020 04/06/2019 10/05/2018 10/05/2018 05/18/2018 05/02/2017  Does Patient Have a Medical Advance Directive? No Yes Yes No No No No  Type of Advance Directive - [place];Living will - - - - -  Does patient want to make changes to medical advance directive? - No - Patient declined No - Patient declined - - - -  Copy of [place] in Chart? - No - copy requested - - - - -  Would patient like information on creating a medical advance directive? No - Patient declined - - No - Patient declined - No - Patient declined No - Patient declined    Current Medications (verified) Outpatient Encounter Medications as of 04/26/2021  Medication Sig   lovastatin (MEVACOR) 10 MG tablet Take 1 tablet (10 mg total) by mouth at bedtime.   VITAMIN D PO Take 500 mg by mouth daily.    No facility-administered encounter medications on file as of 04/26/2021.    Allergies (verified) Patient has no known allergies.   History: Past Medical History:  Diagnosis Date   Abnormal glucose    History of small bowel obstruction    x2   Pneumonia    as a child   Past Surgical History:  Procedure Laterality Date   ACHILLES TENDON SURGERY Right 05/02/2017   Procedure: RIGHT ACHILLES RECONSTRUCTION;  Surgeon: Newt Minion, MD;  Location: [facility];  Service: Orthopedics;  Laterality: Right;   COLONOSCOPY  2008   LAPAROSCOPIC SMALL BOWEL RESECTION  3 1/2 years ago, & 10 years ago   x2   LAPAROTOMY N/A 07/13/2014   Procedure: EXPLORATORY LAPAROTOMY lysis of adhesions;  Surgeon: Leighton Ruff, MD;  Location: [facility];  Service: General;  Laterality: N/A;   LUMBAR  LAMINECTOMY/DECOMPRESSION MICRODISCECTOMY N/A 05/18/2018   Procedure: L4-5 DECOMPRESSION, RIGHT MICRODISCECTOMY;  Surgeon: Marybelle Killings, MD;  Location: [facility];  Service: Orthopedics;  Laterality: N/A;   Family History  Problem Relation Age of Onset   Arthritis Mother    Pulmonary embolism Father    Cancer Neg Hx    Diabetes Neg Hx    Colon cancer Neg Hx    Social History   Socioeconomic History   Marital status: Married    Spouse name: Not on file   Number of children: 1   Years of education: 6   Highest education level: Associate degree: occupational, Hotel manager, or vocational program  Occupational History   Occupation: retired  Tobacco Use   Smoking status: Former    Packs/day: 4.00    Types: Cigarettes    Start date: 04/15/1966    Quit date: 04/15/1970    Years since quitting: 51.0   Smokeless tobacco: Never  Vaping Use   Vaping Use: Never used  Substance and Sexual Activity   Alcohol use: Yes    Alcohol/week: 1.0 standard drink    Types: 1 Glasses of wine per week    Comment: 2-3 times per month    Drug use: No   Sexual activity: Not on file  Other Topics Concern   Not on file  Social  History Narrative   Not on file   Social Determinants of Health   Financial Resource Strain: Low Risk    Difficulty of Paying Living Expenses: Not hard at all  Food Insecurity: No Food Insecurity   Worried About Charity fundraiser in the Last Year: Never true   [place] in the Last Year: Never true  Transportation Needs: No Transportation Needs   Lack of Transportation (Medical): No   Lack of Transportation (Non-Medical): No  Physical Activity: Sufficiently Active   Days of Exercise per Week: 7 days   Minutes of Exercise per Session: 60 min  Stress: No Stress Concern Present   Feeling of Stress : Not at all  Social Connections: Socially Integrated   Frequency of Communication with Friends and Family: Three times a week   Frequency of Social Gatherings with Friends and  Family: Three times a week   Attends Religious Services: More than 4 times per year   Active Member of Clubs or Organizations: Yes   Attends Archivist Meetings: More than 4 times per year   Marital Status: Married    Clinical Intake:  Pre-visit preparation completed: Yes  Diabetic? No  Activities of Daily Living In your present state of health, do you have any difficulty performing the following activities: 04/26/2021  Hearing? N  Vision? N  Walking or climbing stairs? N  Dressing or bathing? N  Doing errands, shopping? N  Preparing Food and eating ? N  Using the Toilet? N  In the past six months, have you accidently leaked urine? N  Do you have problems with loss of bowel control? N  Managing your Medications? N  Managing your Finances? N  Housekeeping or managing your Housekeeping? N  Some recent data might be hidden    Patient Care Team: [place], Betty G, MD as PCP - General (Family Medicine)  Indicate any recent Medical Services you may have received from other than [facility] providers in the past year (date may be approximate).     Assessment:   This is a routine wellness examination for [name]. Virtual Visit via Telephone Note  I connected with  [name] on 04/26/21 at 11:15 AM EST by telephone and verified that I am speaking with the correct person using two identifiers.  Location: Patient: Home Provider: Office Persons participating in the virtual visit: patient/Nurse Health Advisor   I discussed the limitations, risks, security and privacy concerns of performing an evaluation and management service by telephone and the availability of in person appointments. The patient expressed understanding and agreed to proceed.  Interactive audio and video telecommunications were attempted between this nurse and patient, however failed, due to patient having technical difficulties OR patient did not have access to video capability.  We continued and completed  visit with audio only.  Some vital signs may be absent or patient reported.   Criselda Peaches, LPN   Hearing/Vision screen Hearing Screening - Comments:: No difficulty hearing Vision Screening - Comments:: No vision difficulties. Followed by [facility]  Dietary issues and exercise activities discussed: Current Exercise Habits: Home exercise routine, Type of exercise: walking, Time (Minutes): 60, Frequency (Times/Week): 7, Weekly Exercise (Minutes/Week): 420, Intensity: Moderate   Goals Addressed             This Visit's Progress    Patient Stated       I will continue to walk 2-3 miles per day. (10 thousand steps per day).  Depression Screen PHQ 2/9 Scores 04/26/2021 04/25/2020 04/06/2019 01/23/2018 08/27/2017 12/22/2015 07/01/2014  PHQ - 2 Score 0 0 1 0 0 0 0  PHQ- 9 Score - 0 - - - - -    Fall Risk Fall Risk  04/26/2021 04/25/2020 12/03/2019 01/23/2018 08/27/2017  Falls in the past year? 0 0 0 No No  Number falls in past yr: 0 0 0 - -  Injury with Fall? 0 0 0 - -  Risk for fall due to : - No Fall Risks - - -  Follow up - Falls evaluation completed;Falls prevention discussed Education provided - -    FALL RISK PREVENTION PERTAINING TO THE HOME:  Any stairs in or around the home? Yes  If so, are there any without handrails? No  Home free of loose throw rugs in walkways, pet beds, electrical cords, etc? Yes  Adequate lighting in your home to reduce risk of falls? Yes   ASSISTIVE DEVICES UTILIZED TO PREVENT FALLS:  Life alert? No  Use of a cane, walker or w/c? No  Grab bars in the bathroom? Yes  Shower chair or bench in shower? No  Elevated toilet seat or a handicapped toilet? No   TIMED UP AND GO:  Was the test performed? No . Audio Visit  Cognitive Function: MMSE - Mini Mental State Exam 12/22/2015  Not completed: (No Data)     6CIT Screen 04/26/2021 04/06/2019  What Year? 0 points -  What month? 0 points 0 points  What time? 0 points 0 points  Count  back from 20 0 points 0 points  Months in reverse 0 points 0 points  Repeat phrase 0 points 0 points  Total Score 0 -    Immunizations Immunization History  Administered Date(s) Administered   Fluad Quad(high Dose 65+) 02/10/2020, 12/22/2020   Influenza Split 01/24/2012   Influenza Whole 01/12/2007, 01/05/2008, 02/01/2009, 01/09/2010   Influenza, High Dose Seasonal PF 12/22/2015, 01/19/2018   Influenza,inj,Quad PF,6+ Mos 01/11/2013   Influenza-Unspecified 01/19/2018, 01/20/2019   PFIZER(Purple Top)SARS-COV-2 Vaccination 05/20/2019, 06/10/2019, 01/16/2021   Pneumococcal Conjugate-13 08/18/2015   Pneumococcal Polysaccharide-23 01/24/2012, 08/27/2017, 02/10/2020   Td 08/01/2006   Zoster Recombinat (Shingrix) 01/20/2019, 01/20/2019   Zoster, Live 07/21/2007     Covid-19 vaccine status: Completed vaccines  Qualifies for Shingles Vaccine? Yes   Zostavax completed Yes   Shingrix Completed?: Yes  Screening Tests Health Maintenance  Topic Date Due   Zoster Vaccines- Shingrix (2 of 2) 03/17/2019   COVID-19 Vaccine (4 - Booster for Pfizer series) 03/13/2021   TETANUS/TDAP  04/20/2026 (Originally 07/31/2016)   COLONOSCOPY (Pts 45-94yrs Insurance coverage will need to be confirmed)  10/31/2021   Pneumonia Vaccine 96+ Years old  Completed   INFLUENZA VACCINE  Completed   Hepatitis C Screening  Completed   HPV VACCINES  Aged Out    Health Maintenance  Health Maintenance Due  Topic Date Due   Zoster Vaccines- Shingrix (2 of 2) 03/17/2019   COVID-19 Vaccine (4 - Booster for [place] series) 03/13/2021    Additional Screening:   Vision Screening: Recommended annual ophthalmology exams for early detection of glaucoma and other disorders of the eye. Is the patient up to date with their annual eye exam?  Yes  Who is the provider or what is the name of the office in which the patient attends annual eye exams? Followed by [facility] .   Dental Screening: Recommended annual dental  exams for proper oral hygiene  Community Resource Referral /  Chronic Care Management:  CRR required this visit?  No   CCM required this visit?  No      Plan:     I have personally reviewed and noted the following in the patients chart:   Medical and social history Use of alcohol, tobacco or illicit drugs  Current medications and supplements including opioid prescriptions. Patient is not currently taking opioid prescriptions. Functional ability and status Nutritional status Physical activity Advanced directives List of other physicians Hospitalizations, surgeries, and ER visits in previous 12 months Vitals Screenings to include cognitive, depression, and falls Referrals and appointments  In addition, I have reviewed and discussed with patient certain preventive protocols, quality metrics, and best practice recommendations. A written personalized care plan for preventive services as well as general preventive health recommendations were provided to patient.     Criselda Peaches, LPN   [id number]/89/7915

## 2021-04-26 NOTE — Patient Instructions (Addendum)
Richard Middleton , Thank you for taking time to come for your Medicare Wellness Visit. I appreciate your ongoing commitment to your health goals. Please review the following plan we discussed and let me know if I can assist you in the future.   These are the goals we discussed:  Goals      Increase physical activity     By increasing intensity and adding light weights while walking.     Patient Stated     I will continue to walk 2-3 miles per day. (10 thousand steps per day).      Patient Stated     I would like  to start back running!         This is a list of the screening recommended for you and due dates:  Health Maintenance  Topic Date Due   Zoster (Shingles) Vaccine (2 of 2) 03/17/2019   COVID-19 Vaccine (4 - Booster for Pfizer series) 03/13/2021   Tetanus Vaccine  04/20/2026*   Colon Cancer Screening  10/31/2021   Pneumonia Vaccine  Completed   Flu Shot  Completed   Hepatitis C Screening: USPSTF Recommendation to screen - Ages 18-79 yo.  Completed   HPV Vaccine  Aged Out  *Topic was postponed. The date shown is not the original due date.    Advanced directives: No   Conditions/risks identified: None  Next appointment: Follow up in one year for your annual wellness visit. Preventive Care 86 Years and Older, Male Preventive care refers to lifestyle choices and visits with your health care provider that can promote health and wellness. What does preventive care include? A yearly physical exam. This is also called an annual well check. Dental exams once or twice a year. Routine eye exams. Ask your health care provider how often you should have your eyes checked. Personal lifestyle choices, including: Daily care of your teeth and gums. Regular physical activity. Eating a healthy diet. Avoiding tobacco and drug use. Limiting alcohol use. Practicing safe sex. Taking low doses of aspirin every day. Taking vitamin and mineral supplements as recommended by your health care  provider. What happens during an annual well check? The services and screenings done by your health care provider during your annual well check will depend on your age, overall health, lifestyle risk factors, and family history of disease. Counseling  Your health care provider may ask you questions about your: Alcohol use. Tobacco use. Drug use. Emotional well-being. Home and relationship well-being. Sexual activity. Eating habits. History of falls. Memory and ability to understand (cognition). Work and work Statistician. Screening  You may have the following tests or measurements: Height, weight, and BMI. Blood pressure. Lipid and cholesterol levels. These may be checked every 5 years, or more frequently if you are over 8 years old. Skin check. Lung cancer screening. You may have this screening every year starting at age 5 if you have a 30-pack-year history of smoking and currently smoke or have quit within the past 15 years. Fecal occult blood test (FOBT) of the stool. You may have this test every year starting at age 8. Flexible sigmoidoscopy or colonoscopy. You may have a sigmoidoscopy every 5 years or a colonoscopy every 10 years starting at age 61. Prostate cancer screening. Recommendations will vary depending on your family history and other risks. Hepatitis C blood test. Hepatitis B blood test. Sexually transmitted disease (STD) testing. Diabetes screening. This is done by checking your blood sugar (glucose) after you have not eaten for a  while (fasting). You may have this done every 1-3 years. Abdominal aortic aneurysm (AAA) screening. You may need this if you are a current or former smoker. Osteoporosis. You may be screened starting at age 7 if you are at high risk. Talk with your health care provider about your test results, treatment options, and if necessary, the need for more tests. Vaccines  Your health care provider may recommend certain vaccines, such  as: Influenza vaccine. This is recommended every year. Tetanus, diphtheria, and acellular pertussis (Tdap, Td) vaccine. You may need a Td booster every 10 years. Zoster vaccine. You may need this after age 25. Pneumococcal 13-valent conjugate (PCV13) vaccine. One dose is recommended after age 16. Pneumococcal polysaccharide (PPSV23) vaccine. One dose is recommended after age 34. Talk to your health care provider about which screenings and vaccines you need and how often you need them. This information is not intended to replace advice given to you by your health care provider. Make sure you discuss any questions you have with your health care provider. Document Released: 04/28/2015 Document Revised: 12/20/2015 Document Reviewed: 01/31/2015 Elsevier Interactive Patient Education  2017 [place] Prevention in the Home Falls can cause injuries. They can happen to people of all ages. There are many things you can do to make your home safe and to help prevent falls. What can I do on the outside of my home? Regularly fix the edges of walkways and driveways and fix any cracks. Remove anything that might make you trip as you walk through a door, such as a raised step or threshold. Trim any bushes or trees on the path to your home. Use bright outdoor lighting. Clear any walking paths of anything that might make someone trip, such as rocks or tools. Regularly check to see if handrails are loose or broken. Make sure that both sides of any steps have handrails. Any raised decks and porches should have guardrails on the edges. Have any leaves, snow, or ice cleared regularly. Use sand or salt on walking paths during winter. Clean up any spills in your garage right away. This includes oil or grease spills. What can I do in the bathroom? Use night lights. Install grab bars by the toilet and in the tub and shower. Do not use towel bars as grab bars. Use non-skid mats or decals in the tub or  shower. If you need to sit down in the shower, use a plastic, non-slip stool. Keep the floor dry. Clean up any water that spills on the floor as soon as it happens. Remove soap buildup in the tub or shower regularly. Attach bath mats securely with double-sided non-slip rug tape. Do not have throw rugs and other things on the floor that can make you trip. What can I do in the bedroom? Use night lights. Make sure that you have a light by your bed that is easy to reach. Do not use any sheets or blankets that are too big for your bed. They should not hang down onto the floor. Have a firm chair that has side arms. You can use this for support while you get dressed. Do not have throw rugs and other things on the floor that can make you trip. What can I do in the kitchen? Clean up any spills right away. Avoid walking on wet floors. Keep items that you use a lot in easy-to-reach places. If you need to reach something above you, use a strong step stool that has a grab  bar. Keep electrical cords out of the way. Do not use floor polish or wax that makes floors slippery. If you must use wax, use non-skid floor wax. Do not have throw rugs and other things on the floor that can make you trip. What can I do with my stairs? Do not leave any items on the stairs. Make sure that there are handrails on both sides of the stairs and use them. Fix handrails that are broken or loose. Make sure that handrails are as long as the stairways. Check any carpeting to make sure that it is firmly attached to the stairs. Fix any carpet that is loose or worn. Avoid having throw rugs at the top or bottom of the stairs. If you do have throw rugs, attach them to the floor with carpet tape. Make sure that you have a light switch at the top of the stairs and the bottom of the stairs. If you do not have them, ask someone to add them for you. What else can I do to help prevent falls? Wear shoes that: Do not have high heels. Have  rubber bottoms. Are comfortable and fit you well. Are closed at the toe. Do not wear sandals. If you use a stepladder: Make sure that it is fully opened. Do not climb a closed stepladder. Make sure that both sides of the stepladder are locked into place. Ask someone to hold it for you, if possible. Clearly mark and make sure that you can see: Any grab bars or handrails. First and last steps. Where the edge of each step is. Use tools that help you move around (mobility aids) if they are needed. These include: Canes. Walkers. Scooters. Crutches. Turn on the lights when you go into a dark area. Replace any light bulbs as soon as they burn out. Set up your furniture so you have a clear path. Avoid moving your furniture around. If any of your floors are uneven, fix them. If there are any pets around you, be aware of where they are. Review your medicines with your doctor. Some medicines can make you feel dizzy. This can increase your chance of falling. Ask your doctor what other things that you can do to help prevent falls. This information is not intended to replace advice given to you by your health care provider. Make sure you discuss any questions you have with your health care provider. Document Released: 01/26/2009 Document Revised: 09/07/2015 Document Reviewed: 05/06/2014 Elsevier Interactive Patient Education  2017 [employer].

## 2021-05-21 DIAGNOSIS — H40013 Open angle with borderline findings, low risk, bilateral: Secondary | ICD-10-CM | POA: Diagnosis not present

## 2021-06-20 DIAGNOSIS — H40012 Open angle with borderline findings, low risk, left eye: Secondary | ICD-10-CM | POA: Diagnosis not present

## 2021-07-06 DIAGNOSIS — H40012 Open angle with borderline findings, low risk, left eye: Secondary | ICD-10-CM | POA: Diagnosis not present

## 2021-08-01 ENCOUNTER — Telehealth: Payer: Self-pay | Admitting: Family Medicine

## 2021-08-01 NOTE — Telephone Encounter (Signed)
He's had: ? ?Pneumococcal Conjugate-13 08/18/2015 (76 y.o.)     ?Pneumococcal Polysaccharide-23 01/24/2012 (76 y.o.) 08/27/2017 (76 y.o.) 02/10/2020 (76 y.o.)   ? ?

## 2021-08-01 NOTE — Telephone Encounter (Signed)
Pt called stating [facility] is offering a new pneumonia shot and he wanted to check with you all to see if he needs to take it. Ask if you can give him a call with your response. [phone] ? ?Please advise.  ?

## 2021-08-07 NOTE — Telephone Encounter (Signed)
It seems like he has all his pneumonia vaccines (pneumovax and Prevnar 13). ?Thanks, ?BJ ? ?

## 2021-08-08 NOTE — Telephone Encounter (Signed)
I called and spoke with patient. He is aware of message below & verbalized understanding.  ?

## 2021-11-26 ENCOUNTER — Encounter: Payer: Self-pay | Admitting: Gastroenterology

## 2021-12-24 NOTE — Progress Notes (Signed)
HPI: [name] is a 76 y.o.male here today for his routine physical examination.  Last CPE: 12/22/20 He exercises regularly and he is active with yard work and chores around his house. He follows a healthful diet, cooks at home. He eat vegetables daily.  Chronic medical problems: ED, aortic atherosclerosis,back pain, and prediabetes among some.  Immunization History  Administered Date(s) Administered   Fluad Quad(high Dose 65+) 02/10/2020, 12/22/2020   Influenza Split 01/24/2012   Influenza Whole 01/12/2007, 01/05/2008, 02/01/2009, 01/09/2010   Influenza, High Dose Seasonal PF 12/22/2015, 01/19/2018   Influenza,inj,Quad PF,6+ Mos 01/11/2013   Influenza-Unspecified 01/19/2018, 01/20/2019   PFIZER(Purple Top)SARS-COV-2 Vaccination 05/20/2019, 06/10/2019, 01/16/2021   Pneumococcal Conjugate-13 08/18/2015   Pneumococcal Polysaccharide-23 01/24/2012, 08/27/2017, 02/10/2020   Td 08/01/2006   Zoster Recombinat (Shingrix) 01/20/2019, 01/20/2019   Zoster, Live 07/21/2007   Health Maintenance  Topic Date Due   COLONOSCOPY (Pts 45-22yr Insurance coverage will need to be confirmed)  10/31/2021   INFLUENZA VACCINE  11/13/2021   COVID-19 Vaccine (4 - Pfizer risk series) 01/09/2022 (Originally 03/13/2021)   Zoster Vaccines- Shingrix (2 of 2) 04/19/2022 (Originally 03/17/2019)   TETANUS/TDAP  04/20/2026 (Originally 07/31/2016)   Pneumonia Vaccine 75 Years old  Completed   Hepatitis C Screening  Completed   HPV VACCINES  Aged Out   Last prostate ca screening:  Nocturia x 1-2, stable for a while. Lab Results  Component Value Date   PSA 0.41 12/22/2020   PSA 0.2 12/03/2019   PSA 0.28 11/20/2018   Aortic atherosclerosis seen on imaging. He is on Lovastatin 10 mg daily. Tolerating mediation well.  Lab Results  Component Value Date   CHOL 112 03/26/2021   HDL 62.80 03/26/2021   LDLCALC 41 03/26/2021   TRIG 42.0 03/26/2021   CHOLHDL 2 03/26/2021   Lab Results  Component  Value Date   CREATININE 1.05 12/22/2020   BUN 15 12/22/2020   NA 139 12/22/2020   K 4.0 12/22/2020   CL 104 12/22/2020   CO2 27 12/22/2020   Prediabetes:Negative for polydipsia,polyuria, or polyphagia.  Lab Results  Component Value Date   HGBA1C 5.9 12/22/2020   Review of Systems  Constitutional:  Negative for activity change, appetite change and fever.  HENT:  Negative for dental problem, nosebleeds, sore throat, trouble swallowing and voice change.   Eyes:  Negative for redness and visual disturbance.  Respiratory:  Positive for cough (Occasional, non prodiuctive, no associated symptoms. Mainly at rest and at night.). Negative for shortness of breath and wheezing.   Cardiovascular:  Negative for chest pain, palpitations and leg swelling.  Gastrointestinal:  Negative for abdominal pain, blood in stool, nausea and vomiting.  Endocrine: Negative for cold intolerance and heat intolerance.  Genitourinary:  Negative for decreased urine volume, dysuria, genital sores, hematuria and testicular pain.  Musculoskeletal:  Positive for arthralgias and back pain. Negative for gait problem.  Skin:  Negative for color change and rash.  Allergic/Immunologic: Negative for environmental allergies.  Neurological:  Negative for syncope, weakness and headaches.  Hematological:  Negative for adenopathy. Does not bruise/bleed easily.  Psychiatric/Behavioral:  Negative for confusion and sleep disturbance.    Current Outpatient Medications on File Prior to Visit  Medication Sig Dispense Refill   VITAMIN D PO Take 500 mg by mouth daily.      No current facility-administered medications on file prior to visit.   Past Medical History:  Diagnosis Date   Abnormal glucose    History of small bowel obstruction  x2   Pneumonia    as a child   Past Surgical History:  Procedure Laterality Date   ACHILLES TENDON SURGERY Right 05/02/2017   Procedure: RIGHT ACHILLES RECONSTRUCTION;  Surgeon: Newt Minion, MD;  Location: [facility];  Service: Orthopedics;  Laterality: Right;   COLONOSCOPY  2008   LAPAROSCOPIC SMALL BOWEL RESECTION  3 1/2 years ago, & 10 years ago   x2   LAPAROTOMY N/A 07/13/2014   Procedure: EXPLORATORY LAPAROTOMY lysis of adhesions;  Surgeon: Leighton Ruff, MD;  Location: [facility];  Service: General;  Laterality: N/A;   LUMBAR LAMINECTOMY/DECOMPRESSION MICRODISCECTOMY N/A 05/18/2018   Procedure: L4-5 DECOMPRESSION, RIGHT MICRODISCECTOMY;  Surgeon: Marybelle Killings, MD;  Location: [facility];  Service: Orthopedics;  Laterality: N/A;   No Known Allergies  Family History  Problem Relation Age of Onset   Arthritis Mother    Pulmonary embolism Father    Cancer Neg Hx    Diabetes Neg Hx    Colon cancer Neg Hx    Social History   Socioeconomic History   Marital status: Married    Spouse name: Not on file   Number of children: 1   Years of education: 6   Highest education level: Associate degree: occupational, Hotel manager, or vocational program  Occupational History   Occupation: retired  Tobacco Use   Smoking status: Former    Packs/day: 4.00    Types: Cigarettes    Start date: 04/15/1966    Quit date: 04/15/1970    Years since quitting: 51.7   Smokeless tobacco: Never  Vaping Use   Vaping Use: Never used  Substance and Sexual Activity   Alcohol use: Yes    Alcohol/week: 1.0 standard drink of alcohol    Types: 1 Glasses of wine per week    Comment: 2-3 times per month    Drug use: No   Sexual activity: Not on file  Other Topics Concern   Not on file  Social History Narrative   Not on file   Social Determinants of Health   Financial Resource Strain: Low Risk  (04/26/2021)   Overall Financial Resource Strain (CARDIA)    Difficulty of Paying Living Expenses: Not hard at all  Food Insecurity: No Food Insecurity (04/26/2021)   Hunger Vital Sign    Worried About Running Out of Food in the Last Year: Never true    Ran Out of Food in the Last Year: Never true  Transportation  Needs: No Transportation Needs (04/26/2021)   PRAPARE - Hydrologist (Medical): No    Lack of Transportation (Non-Medical): No  Physical Activity: Sufficiently Active (04/26/2021)   Exercise Vital Sign    Days of Exercise per Week: 7 days    Minutes of Exercise per Session: 60 min  Stress: No Stress Concern Present (04/26/2021)   [place]    Feeling of Stress : Not at all  Social Connections: [place] (04/26/2021)   Social Connection and Isolation Panel [NHANES]    Frequency of Communication with Friends and Family: Three times a week    Frequency of Social Gatherings with Friends and Family: Three times a week    Attends Religious Services: More than 4 times per year    Active Member of Clubs or Organizations: Yes    Attends Archivist Meetings: More than 4 times per year    Marital Status: Married   Vitals:   12/25/21 [phone]  BP: 120/80  Pulse: 60  Resp: 12  SpO2: 98%  Body mass index is 21.55 kg/m. Wt Readings from Last 3 Encounters:  12/25/21 163 lb 6 oz (74.1 kg)  04/26/21 172 lb (78 kg)  03/26/21 172 lb 2 oz (78.1 kg)  Physical Exam Vitals and nursing note reviewed.  Constitutional:      General: He is not in acute distress.    Appearance: He is well-developed and normal weight.  HENT:     Head: Normocephalic and atraumatic.     Right Ear: Tympanic membrane, ear canal and external ear normal.     Left Ear: Tympanic membrane, ear canal and external ear normal.     Mouth/Throat:     Mouth: Mucous membranes are moist.     Pharynx: Oropharynx is clear.  Eyes:     Extraocular Movements: Extraocular movements intact.     Conjunctiva/sclera: Conjunctivae normal.     Pupils: Pupils are equal, round, and reactive to light.  Neck:     Thyroid: No thyroid mass.     Trachea: No tracheal deviation.  Cardiovascular:     Rate and Rhythm: Normal rate and regular  rhythm.     Pulses:          Dorsalis pedis pulses are 2+ on the right side and 2+ on the left side.     Heart sounds: No murmur heard. Pulmonary:     Effort: Pulmonary effort is normal. No respiratory distress.     Breath sounds: Normal breath sounds.  Abdominal:     Palpations: Abdomen is soft. There is no hepatomegaly or mass.     Tenderness: There is no abdominal tenderness.  Genitourinary:    Comments: No concerns. Musculoskeletal:        General: No tenderness.     Cervical back: Normal range of motion.     Comments: No signs of synovitis.  Lymphadenopathy:     Cervical: No cervical adenopathy.     Upper Body:     Right upper body: No supraclavicular adenopathy.     Left upper body: No supraclavicular (Prominent, I do not appreciate masses.) adenopathy.  Skin:    General: Skin is warm.     Findings: No erythema.  Neurological:     General: No focal deficit present.     Mental Status: He is alert and oriented to person, place, and time.     Cranial Nerves: No cranial nerve deficit.     Sensory: No sensory deficit.     Motor: No weakness.     Gait: Gait normal.     Deep Tendon Reflexes:     Reflex Scores:      Bicep reflexes are 2+ on the right side and 2+ on the left side.      Patellar reflexes are 2+ on the right side and 2+ on the left side. Psychiatric:        Mood and Affect: Mood and affect normal.   ASSESSMENT AND PLAN:  Mr.Brenin was seen today for annual exam.  Diagnoses and all orders for this visit: Orders Placed This Encounter  Procedures   US Soft Tissue Head/Neck (NON-THYROID)   Comprehensive metabolic panel   Hemoglobin A1c   Lipid panel   PSA(Must document that pt has been informed of limitations of PSA testing.)   Lab Results  Component Value Date   HGBA1C 6.1 12/25/2021   Lab Results  Component Value Date   CREATININE 1.07 12/25/2021   BUN 17  12/25/2021   NA 140 12/25/2021   K 4.2 12/25/2021   CL 103 12/25/2021   CO2 31 12/25/2021    Lab Results  Component Value Date   ALT 13 12/25/2021   AST 17 12/25/2021   ALKPHOS 103 12/25/2021   BILITOT 0.7 12/25/2021   Lab Results  Component Value Date   PSA 0.60 12/25/2021   Routine general medical examination at a health care facility We discussed the importance of regular physical activity and healthy diet for prevention of chronic illness and/or complications. Preventive guidelines reviewed. Vaccination up to date. Former smoker, quit in 1972. Negative for AAA ([place] 01/2018). Colonoscopy due in 10/2023 Next CPE in a year.  BPH associated with nocturia Symptoms have been stable. PSA ordered today after discussing prostate cancer screening guidelines.  Prediabetes Continue a healthy life style for diabetes prevention.  Atherosclerosis of aorta (HCC) Continue Lovastatin 10 mg daily and low fat diet.  -     lovastatin (MEVACOR) 10 MG tablet; Take 1 tablet (10 mg total) by mouth at bedtime.  Supraclavicular fossa fullness Left side, no associated symptoms. Soft tissue US will be arranged.  Return in 1 year (on 12/26/2022) for cpe and f/u.  Chirstina Haan G. [place], MD  [facility]. [place] office.

## 2021-12-25 ENCOUNTER — Ambulatory Visit (INDEPENDENT_AMBULATORY_CARE_PROVIDER_SITE_OTHER): Payer: Medicare Other | Admitting: Family Medicine

## 2021-12-25 ENCOUNTER — Encounter: Payer: Self-pay | Admitting: Family Medicine

## 2021-12-25 VITALS — BP 120/80 | HR 60 | Resp 12 | Ht 73.0 in | Wt 163.4 lb

## 2021-12-25 DIAGNOSIS — R351 Nocturia: Secondary | ICD-10-CM

## 2021-12-25 DIAGNOSIS — I700 Atherosclerosis of aorta: Secondary | ICD-10-CM | POA: Diagnosis not present

## 2021-12-25 DIAGNOSIS — Z0000 Encounter for general adult medical examination without abnormal findings: Secondary | ICD-10-CM | POA: Diagnosis not present

## 2021-12-25 DIAGNOSIS — R7303 Prediabetes: Secondary | ICD-10-CM

## 2021-12-25 DIAGNOSIS — N401 Enlarged prostate with lower urinary tract symptoms: Secondary | ICD-10-CM

## 2021-12-25 DIAGNOSIS — R222 Localized swelling, mass and lump, trunk: Secondary | ICD-10-CM | POA: Diagnosis not present

## 2021-12-25 LAB — COMPREHENSIVE METABOLIC PANEL
ALT: 13 U/L (ref 0–53)
AST: 17 U/L (ref 0–37)
Albumin: 4.1 g/dL (ref 3.5–5.2)
Alkaline Phosphatase: 103 U/L (ref 39–117)
BUN: 17 mg/dL (ref 6–23)
CO2: 31 mEq/L (ref 19–32)
Calcium: 9.5 mg/dL (ref 8.4–10.5)
Chloride: 103 mEq/L (ref 96–112)
Creatinine, Ser: 1.07 mg/dL (ref 0.40–1.50)
GFR: 67.97 mL/min (ref >60.00)
Glucose, Bld: 85 mg/dL (ref 70–99)
Potassium: 4.2 mEq/L (ref 3.5–5.1)
Sodium: 140 mEq/L (ref 135–145)
Total Bilirubin: 0.7 mg/dL (ref 0.2–1.2)
Total Protein: 7 g/dL (ref 6.0–8.3)

## 2021-12-25 LAB — LIPID PANEL
Cholesterol: 123 mg/dL (ref 0–200)
HDL: 65.8 mg/dL (ref >39.00)
LDL Cholesterol: 49 mg/dL (ref 0–99)
NonHDL: 57.58
Total CHOL/HDL Ratio: 2
Triglycerides: 41 mg/dL (ref 0.0–149.0)
VLDL: 8.2 mg/dL (ref 0.0–40.0)

## 2021-12-25 LAB — HEMOGLOBIN A1C: Hgb A1c MFr Bld: 6.1 % (ref 4.6–6.5)

## 2021-12-25 LAB — PSA: PSA: 0.6 ng/mL (ref 0.10–4.00)

## 2021-12-25 NOTE — Patient Instructions (Addendum)
A few things to remember from today's visit:  Routine general medical examination at a health care facility  BPH associated with nocturia - Plan: PSA(Must document that pt has been informed of limitations of PSA testing.)  Prediabetes - Plan: Comprehensive metabolic panel, Hemoglobin A1c  Atherosclerosis of aorta (HCC) - Plan: Comprehensive metabolic panel, Lipid panel  Supraclavicular fossa fullness - Plan: US Soft Tissue Head/Neck (NON-THYROID)  If you need refills for medications you take chronically, please call your pharmacy. Do not use My Chart to request refills or for acute issues that need immediate attention. If you send a my chart message, it may take a few days to be addressed, specially if I am not in the office.  Please be sure medication list is accurate. If a new problem present, please set up appointment sooner than planned today.  Preventive Care 4 Years and Older, Male Preventive care refers to lifestyle choices and visits with your health care provider that can promote health and wellness. Preventive care visits are also called wellness exams. What can I expect for my preventive care visit? Counseling During your preventive care visit, your health care provider may ask about your: Medical history, including: Past medical problems. Family medical history. History of falls. Current health, including: Emotional well-being. Home life and relationship well-being. Sexual activity. Memory and ability to understand (cognition). Lifestyle, including: Alcohol, nicotine or tobacco, and drug use. Access to firearms. Diet, exercise, and sleep habits. Work and work Statistician. Sunscreen use. Safety issues such as seatbelt and bike helmet use. Physical exam Your health care provider will check your: Height and weight. These may be used to calculate your BMI (body mass index). BMI is a measurement that tells if you are at a healthy weight. Waist circumference. This  measures the distance around your waistline. This measurement also tells if you are at a healthy weight and may help predict your risk of certain diseases, such as type 2 diabetes and high blood pressure. Heart rate and blood pressure. Body temperature. Skin for abnormal spots. What immunizations do I need?  Vaccines are usually given at various ages, according to a schedule. Your health care provider will recommend vaccines for you based on your age, medical history, and lifestyle or other factors, such as travel or where you work. What tests do I need? Screening Your health care provider may recommend screening tests for certain conditions. This may include: Lipid and cholesterol levels. Diabetes screening. This is done by checking your blood sugar (glucose) after you have not eaten for a while (fasting). Hepatitis C test. Hepatitis B test. HIV (human immunodeficiency virus) test. STI (sexually transmitted infection) testing, if you are at risk. Lung cancer screening. Colorectal cancer screening. Prostate cancer screening. Abdominal aortic aneurysm (AAA) screening. You may need this if you are a current or former smoker. Talk with your health care provider about your test results, treatment options, and if necessary, the need for more tests. Follow these instructions at home: Eating and drinking  Eat a diet that includes fresh fruits and vegetables, whole grains, lean protein, and low-fat dairy products. Limit your intake of foods with high amounts of sugar, saturated fats, and salt. Take vitamin and mineral supplements as recommended by your health care provider. Do not drink alcohol if your health care provider tells you not to drink. If you drink alcohol: Limit how much you have to 0-2 drinks a day. Know how much alcohol is in your drink. In the [place] one drink equals one  12 oz bottle of beer (355 mL), one 5 oz glass of wine (148 mL), or one 1 oz glass of hard liquor (44  mL). Lifestyle Brush your teeth every morning and night with fluoride toothpaste. Floss one time each day. Exercise for at least 30 minutes 5 or more days each week. Do not use any products that contain nicotine or tobacco. These products include cigarettes, chewing tobacco, and vaping devices, such as e-cigarettes. If you need help quitting, ask your health care provider. Do not use drugs. If you are sexually active, practice safe sex. Use a condom or other form of protection to prevent STIs. Take aspirin only as told by your health care provider. Make sure that you understand how much to take and what form to take. Work with your health care provider to find out whether it is safe and beneficial for you to take aspirin daily. Ask your health care provider if you need to take a cholesterol-lowering medicine (statin). Find healthy ways to manage stress, such as: Meditation, yoga, or listening to music. Journaling. Talking to a trusted person. Spending time with friends and family. Safety Always wear your seat belt while driving or riding in a vehicle. Do not drive: If you have been drinking alcohol. Do not ride with someone who has been drinking. When you are tired or distracted. While texting. If you have been using any mind-altering substances or drugs. Wear a helmet and other protective equipment during sports activities. If you have firearms in your house, make sure you follow all gun safety procedures. Minimize exposure to UV radiation to reduce your risk of skin cancer. What's next? Visit your health care provider once a year for an annual wellness visit. Ask your health care provider how often you should have your eyes and teeth checked. Stay up to date on all vaccines. This information is not intended to replace advice given to you by your health care provider. Make sure you discuss any questions you have with your health care provider. Document Revised: 09/27/2020 Document  Reviewed: 09/27/2020 Elsevier Patient Education  [place].

## 2021-12-26 ENCOUNTER — Telehealth: Payer: Self-pay | Admitting: Family Medicine

## 2021-12-26 MED ORDER — LOVASTATIN 10 MG PO TABS: 10.0000 mg | ORAL_TABLET | Freq: Every day | ORAL | 3 refills | Status: DC

## 2021-12-26 NOTE — Telephone Encounter (Signed)
Pt has viewed his blood work on [employer] and has ?'s

## 2021-12-27 ENCOUNTER — Ambulatory Visit (HOSPITAL_BASED_OUTPATIENT_CLINIC_OR_DEPARTMENT_OTHER)
Admission: RE | Admit: 2021-12-27 | Discharge: 2021-12-27 | Disposition: A | Discharge status: Home / Self Care | Payer: Medicare Other | Source: Ambulatory Visit | Attending: Family Medicine | Admitting: Family Medicine

## 2021-12-27 DIAGNOSIS — R222 Localized swelling, mass and lump, trunk: Secondary | ICD-10-CM | POA: Insufficient documentation

## 2021-12-27 DIAGNOSIS — R2232 Localized swelling, mass and lump, left upper limb: Secondary | ICD-10-CM | POA: Diagnosis not present

## 2021-12-28 NOTE — Telephone Encounter (Signed)
your hemoglobin A1C is mildly elevated, still prediabetes. Continue a healthful diet and regular physical activity for diabetes prevention. Liver test,kidney function,electrolytes,cholesterol,and prostate test in normal range.

## 2021-12-28 NOTE — Telephone Encounter (Signed)
Informed patient of results and patient verbalized understanding. Patient is aware that his ultrasound report was negative.

## 2022-01-04 DIAGNOSIS — H40013 Open angle with borderline findings, low risk, bilateral: Secondary | ICD-10-CM | POA: Diagnosis not present

## 2022-03-12 ENCOUNTER — Telehealth: Payer: Self-pay | Admitting: Family Medicine

## 2022-03-12 NOTE — Telephone Encounter (Signed)
Wants to know if he should take the RSV immunization

## 2022-03-12 NOTE — Telephone Encounter (Signed)
RSV vaccination is recommended for patients 75 years of age and older. Thanks, BJ

## 2022-03-12 NOTE — Telephone Encounter (Signed)
I spoke with patient. He is aware of message below.

## 2022-03-22 ENCOUNTER — Encounter: Payer: Self-pay | Admitting: *Deleted

## 2022-03-22 ENCOUNTER — Telehealth: Payer: Self-pay | Admitting: *Deleted

## 2022-03-22 NOTE — Patient Outreach (Signed)
  Care Coordination   Initial Visit Note   03/22/2022 Name: Richard Middleton MRN: [id number] DOB: 1945/07/25  Richard Middleton is a 76 y.o. year old male who sees [place], Malka So, MD for primary care. I spoke with  Richard Middleton by phone today.  What matters to the patients health and wellness today?  No needs    Goals Addressed               This Visit's Progress     COMPLETED: No needs (pt-stated)        Care Coordination Interventions: Reviewed medications with patient and discussed adherences with no needed refills Reviewed scheduled/upcoming provider appointments including sufficient transportation source Screening for signs and symptoms of depression related to chronic disease state  Assessed social determinant of health barriers          SDOH assessments and interventions completed:  Yes  SDOH Interventions Today    Flowsheet Row Most Recent Value  SDOH Interventions   Food Insecurity Interventions Intervention Not Indicated  Housing Interventions Intervention Not Indicated  Transportation Interventions Intervention Not Indicated  Utilities Interventions Intervention Not Indicated        Care Coordination Interventions:  Yes, provided   Follow up plan: No further intervention required.   Encounter Outcome:  Pt. Visit Completed   Raina Mina, RN Care Management Coordinator [place] Office [phone]

## 2022-03-22 NOTE — Patient Instructions (Signed)
Visit Information  Thank you for taking time to visit with me today. Please don't hesitate to contact me if I can be of assistance to you.   Following are the goals we discussed today:   Goals Addressed               This Visit's Progress     COMPLETED: No needs (pt-stated)        Care Coordination Interventions: Reviewed medications with patient and discussed adherences with no needed refills Reviewed scheduled/upcoming provider appointments including sufficient transportation source Screening for signs and symptoms of depression related to chronic disease state  Assessed social determinant of health barriers         Please call the care guide team at [phone] if you need to cancel or reschedule your appointment.   If you are experiencing a Mental Health or [place] or need someone to talk to, please call the Suicide and Crisis Lifeline: 988  Patient verbalizes understanding of instructions and care plan provided today and agrees to view in [place]. Active MyChart status and patient understanding of how to access instructions and care plan via MyChart confirmed with patient.     No further follow up required: No further follow up needed  Raina Mina, RN Care Management Coordinator [place] Office [phone]

## 2022-04-30 ENCOUNTER — Ambulatory Visit: Payer: Medicare HMO

## 2022-04-30 VITALS — Ht 73.0 in | Wt 170.0 lb

## 2022-04-30 DIAGNOSIS — Z0000 Encounter for general adult medical examination without abnormal findings: Secondary | ICD-10-CM | POA: Diagnosis not present

## 2022-04-30 NOTE — Patient Instructions (Addendum)
[name] , Thank you for taking time to come for your Medicare Wellness Visit. I appreciate your ongoing commitment to your health goals. Please review the following plan we discussed and let me know if I can assist you in the future.   These are the goals we discussed:  Goals       Increase physical activity      By increasing intensity and adding light weights while walking.      Patient Stated (pt-stated)      I will continue to walk 2-3 miles per day. (10 thousand steps per day).       Patient Stated      I would like  to start back running!         This is a list of the screening recommended for you and due dates:  Health Maintenance  Topic Date Due   DTaP/Tdap/Td vaccine (2 - Tdap) 07/31/2016   COVID-19 Vaccine (4 - 2023-24 season) 05/16/2022*   Flu Shot  07/14/2022*   Medicare Annual Wellness Visit  05/01/2023   Pneumonia Vaccine  Completed   Hepatitis C Screening: USPSTF Recommendation to screen - Ages 18-79 yo.  Completed   HPV Vaccine  Aged Out   Colon Cancer Screening  Discontinued   Zoster (Shingles) Vaccine  Discontinued  *Topic was postponed. The date shown is not the original due date.    Advanced directives: Advance directive discussed with you today. Even though you declined this today, please call our office should you change your mind, and we can give you the proper paperwork for you to fill out.   Conditions/risks identified: None  Next appointment: Follow up in one year for your annual wellness visit.   Preventive Care 48 Years and Older, Male  Preventive care refers to lifestyle choices and visits with your health care provider that can promote health and wellness. What does preventive care include? A yearly physical exam. This is also called an annual well check. Dental exams once or twice a year. Routine eye exams. Ask your health care provider how often you should have your eyes checked. Personal lifestyle choices, including: Daily care of your  teeth and gums. Regular physical activity. Eating a healthy diet. Avoiding tobacco and drug use. Limiting alcohol use. Practicing safe sex. Taking low doses of aspirin every day. Taking vitamin and mineral supplements as recommended by your health care provider. What happens during an annual well check? The services and screenings done by your health care provider during your annual well check will depend on your age, overall health, lifestyle risk factors, and family history of disease. Counseling  Your health care provider may ask you questions about your: Alcohol use. Tobacco use. Drug use. Emotional well-being. Home and relationship well-being. Sexual activity. Eating habits. History of falls. Memory and ability to understand (cognition). Work and work Statistician. Screening  You may have the following tests or measurements: Height, weight, and BMI. Blood pressure. Lipid and cholesterol levels. These may be checked every 5 years, or more frequently if you are over 70 years old. Skin check. Lung cancer screening. You may have this screening every year starting at age 25 if you have a 30-pack-year history of smoking and currently smoke or have quit within the past 15 years. Fecal occult blood test (FOBT) of the stool. You may have this test every year starting at age 20. Flexible sigmoidoscopy or colonoscopy. You may have a sigmoidoscopy every 5 years or a colonoscopy every 10 years  starting at age 35. Prostate cancer screening. Recommendations will vary depending on your family history and other risks. Hepatitis C blood test. Hepatitis B blood test. Sexually transmitted disease (STD) testing. Diabetes screening. This is done by checking your blood sugar (glucose) after you have not eaten for a while (fasting). You may have this done every 1-3 years. Abdominal aortic aneurysm (AAA) screening. You may need this if you are a current or former smoker. Osteoporosis. You may be  screened starting at age 57 if you are at high risk. Talk with your health care provider about your test results, treatment options, and if necessary, the need for more tests. Vaccines  Your health care provider may recommend certain vaccines, such as: Influenza vaccine. This is recommended every year. Tetanus, diphtheria, and acellular pertussis (Tdap, Td) vaccine. You may need a Td booster every 10 years. Zoster vaccine. You may need this after age 48. Pneumococcal 13-valent conjugate (PCV13) vaccine. One dose is recommended after age 39. Pneumococcal polysaccharide (PPSV23) vaccine. One dose is recommended after age 21. Talk to your health care provider about which screenings and vaccines you need and how often you need them. This information is not intended to replace advice given to you by your health care provider. Make sure you discuss any questions you have with your health care provider. Document Released: 04/28/2015 Document Revised: 12/20/2015 Document Reviewed: 01/31/2015 Elsevier Interactive Patient Education  2017 [place] Prevention in the Home Falls can cause injuries. They can happen to people of all ages. There are many things you can do to make your home safe and to help prevent falls. What can I do on the outside of my home? Regularly fix the edges of walkways and driveways and fix any cracks. Remove anything that might make you trip as you walk through a door, such as a raised step or threshold. Trim any bushes or trees on the path to your home. Use bright outdoor lighting. Clear any walking paths of anything that might make someone trip, such as rocks or tools. Regularly check to see if handrails are loose or broken. Make sure that both sides of any steps have handrails. Any raised decks and porches should have guardrails on the edges. Have any leaves, snow, or ice cleared regularly. Use sand or salt on walking paths during winter. Clean up any spills in  your garage right away. This includes oil or grease spills. What can I do in the bathroom? Use night lights. Install grab bars by the toilet and in the tub and shower. Do not use towel bars as grab bars. Use non-skid mats or decals in the tub or shower. If you need to sit down in the shower, use a plastic, non-slip stool. Keep the floor dry. Clean up any water that spills on the floor as soon as it happens. Remove soap buildup in the tub or shower regularly. Attach bath mats securely with double-sided non-slip rug tape. Do not have throw rugs and other things on the floor that can make you trip. What can I do in the bedroom? Use night lights. Make sure that you have a light by your bed that is easy to reach. Do not use any sheets or blankets that are too big for your bed. They should not hang down onto the floor. Have a firm chair that has side arms. You can use this for support while you get dressed. Do not have throw rugs and other things on the floor that  can make you trip. What can I do in the kitchen? Clean up any spills right away. Avoid walking on wet floors. Keep items that you use a lot in easy-to-reach places. If you need to reach something above you, use a strong step stool that has a grab bar. Keep electrical cords out of the way. Do not use floor polish or wax that makes floors slippery. If you must use wax, use non-skid floor wax. Do not have throw rugs and other things on the floor that can make you trip. What can I do with my stairs? Do not leave any items on the stairs. Make sure that there are handrails on both sides of the stairs and use them. Fix handrails that are broken or loose. Make sure that handrails are as long as the stairways. Check any carpeting to make sure that it is firmly attached to the stairs. Fix any carpet that is loose or worn. Avoid having throw rugs at the top or bottom of the stairs. If you do have throw rugs, attach them to the floor with carpet  tape. Make sure that you have a light switch at the top of the stairs and the bottom of the stairs. If you do not have them, ask someone to add them for you. What else can I do to help prevent falls? Wear shoes that: Do not have high heels. Have rubber bottoms. Are comfortable and fit you well. Are closed at the toe. Do not wear sandals. If you use a stepladder: Make sure that it is fully opened. Do not climb a closed stepladder. Make sure that both sides of the stepladder are locked into place. Ask someone to hold it for you, if possible. Clearly mark and make sure that you can see: Any grab bars or handrails. First and last steps. Where the edge of each step is. Use tools that help you move around (mobility aids) if they are needed. These include: Canes. Walkers. Scooters. Crutches. Turn on the lights when you go into a dark area. Replace any light bulbs as soon as they burn out. Set up your furniture so you have a clear path. Avoid moving your furniture around. If any of your floors are uneven, fix them. If there are any pets around you, be aware of where they are. Review your medicines with your doctor. Some medicines can make you feel dizzy. This can increase your chance of falling. Ask your doctor what other things that you can do to help prevent falls. This information is not intended to replace advice given to you by your health care provider. Make sure you discuss any questions you have with your health care provider. Document Released: 01/26/2009 Document Revised: 09/07/2015 Document Reviewed: 05/06/2014 Elsevier Interactive Patient Education  2017 [employer].

## 2022-04-30 NOTE — Progress Notes (Addendum)
Subjective:   Richard Middleton is a 77 y.o. male who presents for Medicare Annual/Subsequent preventive examination.  Review of Systems    Virtual Visit via Telephone Note  I connected with  GERRITT GALENTINE on 04/30/22 at 11:30 AM EST by telephone and verified that I am speaking with the correct person using two identifiers.  Location: Patient: Home Provider: Office Persons participating in the virtual visit: patient/Nurse Health Advisor   I discussed the limitations, risks, security and privacy concerns of performing an evaluation and management service by telephone and the availability of in person appointments. The patient expressed understanding and agreed to proceed.  Interactive audio and video telecommunications were attempted between this nurse and patient, however failed, due to patient having technical difficulties OR patient did not have access to video capability.  We continued and completed visit with audio only.  Some vital signs may be absent or patient reported.   Criselda Peaches, LPN  Cardiac Risk Factors include: advanced age (>65mn, >>48women)     Objective:    Today's Vitals   04/30/22 1133  Weight: 170 lb (77.1 kg)  Height: '6\' 1"'$  (1.854 m)   Body mass index is 22.43 kg/m.     04/30/2022   11:41 AM 04/26/2021   11:30 AM 04/25/2020   11:28 AM 04/06/2019    3:04 PM 10/05/2018    2:22 PM 10/05/2018    4:00 AM 05/18/2018   12:24 PM  Advanced Directives  Does Patient Have a Medical Advance Directive? No No Yes Yes No No No  Type of AScientist, physiologicalof [place] will      Does patient want to make changes to medical advance directive?   No - Patient declined No - Patient declined     [place] of [place]?   No - copy requested      Would patient like information on creating a medical advance directive? No - Patient declined No - Patient declined   No - Patient declined  No - Patient declined    Current  Medications (verified) Outpatient Encounter Medications as of 04/30/2022  Medication Sig   lovastatin (MEVACOR) 10 MG tablet Take 1 tablet (10 mg total) by mouth at bedtime.   VITAMIN D PO Take 500 mg by mouth daily.    No facility-administered encounter medications on file as of 04/30/2022.    Allergies (verified) Patient has no known allergies.   History: Past Medical History:  Diagnosis Date   Abnormal glucose    History of small bowel obstruction    x2   Pneumonia    as a child   Past Surgical History:  Procedure Laterality Date   ACHILLES TENDON SURGERY Right 05/02/2017   Procedure: RIGHT ACHILLES RECONSTRUCTION;  Surgeon: DNewt Minion MD;  [facility]  Service: Orthopedics;  Laterality: Right;   COLONOSCOPY  2008   LAPAROSCOPIC SMALL BOWEL RESECTION  3 1/2 years ago, & 10 years ago   x2   LAPAROTOMY N/A 07/13/2014   Procedure: EXPLORATORY LAPAROTOMY lysis of adhesions;  Surgeon: ALeighton Ruff MD;  Location: [facility];  Service: General;  Laterality: N/A;   LUMBAR LAMINECTOMY/DECOMPRESSION MICRODISCECTOMY N/A 05/18/2018   Procedure: L4-5 DECOMPRESSION, RIGHT MICRODISCECTOMY;  Surgeon: YMarybelle Killings MD;  [facility]  Service: Orthopedics;  Laterality: N/A;   Family History  Problem Relation Age of Onset   Arthritis Mother    Pulmonary embolism Father  Cancer Neg Hx    Diabetes Neg Hx    Colon cancer Neg Hx    Social History   Socioeconomic History   Marital status: Married    Spouse name: Not on file   Number of children: 1   Years of education: 6   Highest education level: Associate degree: occupational, Hotel manager, or vocational program  Occupational History   Occupation: retired  Tobacco Use   Smoking status: Former    Packs/day: 4.00    Types: Cigarettes    Start date: 04/15/1966    Quit date: 04/15/1970    Years since quitting: 52.0   Smokeless tobacco: Never  Vaping Use   Vaping Use: Never used  Substance and Sexual Activity   Alcohol use:  Yes    Alcohol/week: 1.0 standard drink of alcohol    Types: 1 Glasses of wine per week    Comment: 2-3 times per month    Drug use: No   Sexual activity: Not on file  Other Topics Concern   Not on file  Social History Narrative   Not on file   Social Determinants of Health   Financial Resource Strain: Low Risk  (04/30/2022)   Overall Financial Resource Strain (CARDIA)    Difficulty of Paying Living Expenses: Not hard at all  Food Insecurity: No Food Insecurity (04/30/2022)   Hunger Vital Sign    Worried About Running Out of Food in the Last Year: Never true    Ran Out of Food in the Last Year: Never true  Transportation Needs: No Transportation Needs (04/30/2022)   PRAPARE - Hydrologist (Medical): No    Lack of Transportation (Non-Medical): No  Physical Activity: Sufficiently Active (04/30/2022)   Exercise Vital Sign    Days of Exercise per Week: 7 days    Minutes of Exercise per Session: 60 min  Stress: No Stress Concern Present (04/30/2022)   [place]    Feeling of Stress : Not at all  Social Connections: Rudy (04/30/2022)   Social Connection and Isolation Panel [NHANES]    Frequency of Communication with Friends and Family: More than three times a week    Frequency of Social Gatherings with Friends and Family: More than three times a week    Attends Religious Services: More than 4 times per year    Active Member of [employer] or Organizations: Yes    Attends Music therapist: More than 4 times per year    Marital Status: Married    Tobacco Counseling Counseling given: Not Answered   Clinical Intake:  Pre-visit preparation completed: No  Pain : No/denies pain     BMI - recorded: 22.43 Nutritional Status: BMI of 19-24  Normal Nutritional Risks: None Diabetes: No  How often do you need to have someone help you when you read instructions,  pamphlets, or other written materials from your doctor or pharmacy?: 1 - Never  Diabetic?  No  Interpreter Needed?: No  Information entered by :: Rolene Arbour LPN   Activities of Daily Living    04/30/2022   11:40 AM  In your present state of health, do you have any difficulty performing the following activities:  Hearing? 0  Vision? 0  Difficulty concentrating or making decisions? 0  Walking or climbing stairs? 0  Dressing or bathing? 0  Doing errands, shopping? 0  Preparing Food and eating ? N  Using the Toilet? N  In  the past six months, have you accidently leaked urine? N  Do you have problems with loss of bowel control? N  Managing your Medications? N  Managing your Finances? N  Housekeeping or managing your Housekeeping? N    Patient Care Team: [place], Betty G, MD as PCP - General (Family Medicine)  Indicate any recent Medical Services you may have received from other than [facility] providers in the past year (date may be approximate).     Assessment:   This is a routine wellness examination for [place].  Hearing/Vision screen Hearing Screening - Comments:: Denies hearing difficulties   Vision Screening - Comments:: Wears rx glasses - up to date with routine eye exams with  [facility]  Dietary issues and exercise activities discussed: Current Exercise Habits: Home exercise routine, Type of exercise: walking;stretching, Time (Minutes): 60, Frequency (Times/Week): 7, Weekly Exercise (Minutes/Week): 420, Intensity: Moderate, Exercise limited by: None identified   Goals Addressed               This Visit's Progress     Patient Stated (pt-stated)        I will continue to walk 2-3 miles per day. (10 thousand steps per day).        Depression Screen    04/30/2022   11:39 AM 03/22/2022   10:26 AM 12/25/2021   10:10 AM 04/26/2021   11:21 AM 04/25/2020   11:34 AM 04/06/2019    3:05 PM 01/23/2018    1:51 PM  PHQ 2/9 Scores  PHQ - 2 Score 0 0 0 0 0 1 0  PHQ-  9 Score     0      Fall Risk    04/30/2022   11:41 AM 12/25/2021   10:10 AM 04/26/2021   11:26 AM 04/25/2020   11:30 AM 12/03/2019    9:04 AM  [place] in the past year? 0 0 0 0 0  Number falls in past yr: 0 0 0 0 0  Injury with Fall? 0 0 0 0 0  Risk for fall due to : No Fall Risks Other (Comment)  No Fall Risks   Follow up Falls prevention discussed Falls evaluation completed  Falls evaluation completed;Falls prevention discussed Education provided    FALL RISK PREVENTION PERTAINING TO THE HOME:  Any stairs in or around the home? Yes  If so, are there any without handrails? No  Home free of loose throw rugs in walkways, pet beds, electrical cords, etc? Yes  Adequate lighting in your home to reduce risk of falls? Yes   ASSISTIVE DEVICES UTILIZED TO PREVENT FALLS:  Life alert? No  Use of a cane, walker or w/c? No  Grab bars in the bathroom? Yes  Shower chair or bench in shower? No  Elevated toilet seat or a handicapped toilet? Yes   TIMED UP AND GO:  Was the test performed? No . Audio Visit   Cognitive Function:        04/30/2022   11:42 AM 04/26/2021   11:27 AM 04/06/2019    3:16 PM  6CIT Screen  What Year? 0 points 0 points   What month? 0 points 0 points 0 points  What time? 0 points 0 points 0 points  Count back from 20 0 points 0 points 0 points  Months in reverse 0 points 0 points 0 points  Repeat phrase 0 points 0 points 0 points  Total Score 0 points 0 points     Immunizations  Immunization History  Administered Date(s) Administered   Fluad Quad(high Dose 65+) 02/10/2020, 12/22/2020   Influenza Split 01/24/2012   Influenza Whole 01/12/2007, 01/05/2008, 02/01/2009, 01/09/2010   Influenza, High Dose Seasonal PF 12/22/2015, 01/19/2018   Influenza,inj,Quad PF,6+ Mos 01/11/2013   Influenza-Unspecified 01/19/2018, 01/20/2019   PFIZER(Purple Top)SARS-COV-2 Vaccination 05/20/2019, 06/10/2019, 01/16/2021   Pneumococcal Conjugate-13 08/18/2015    Pneumococcal Polysaccharide-23 01/24/2012, 08/27/2017, 02/10/2020   Td 08/01/2006   Zoster Recombinat (Shingrix) 01/20/2019, 01/20/2019   Zoster, Live 07/21/2007    TDAP status: Due, Education has been provided regarding the importance of this vaccine. Advised may receive this vaccine at local pharmacy or Health Dept. Aware to provide a copy of the vaccination record if obtained from local pharmacy or Health Dept. Verbalized acceptance and understanding.  Flu Vaccine status: Up to date  Pneumococcal vaccine status: Up to date  Covid-19 vaccine status: Completed vaccines  Qualifies for Shingles Vaccine? Yes   Zostavax completed Yes   Shingrix Completed?: Yes  Screening Tests Health Maintenance  Topic Date Due   DTaP/Tdap/Td (2 - Tdap) 07/31/2016   COVID-19 Vaccine (4 - 2023-24 season) 05/16/2022 (Originally 12/14/2021)   INFLUENZA VACCINE  07/14/2022 (Originally 11/13/2021)   Medicare Annual Wellness (AWV)  05/01/2023   Pneumonia Vaccine 79+ Years old  Completed   Hepatitis C Screening  Completed   HPV VACCINES  Aged Out   COLONOSCOPY (Pts 45-19yr Insurance coverage will need to be confirmed)  Discontinued   Zoster Vaccines- Shingrix  Discontinued    Health Maintenance  Health Maintenance Due  Topic Date Due   DTaP/Tdap/Td (2 - Tdap) 07/31/2016    Colorectal cancer screening: No longer required.   Lung Cancer Screening: (Low Dose CT Chest recommended if Age 76-80years, 30 pack-year currently smoking OR have quit w/in 15years.) does not qualify.     Additional Screening:  Hepatitis C Screening: does qualify; Completed 01/23/18  Vision Screening: Recommended annual ophthalmology exams for early detection of glaucoma and other disorders of the eye. Is the patient up to date with their annual eye exam?  Yes  Who is the provider or what is the name of the office in which the patient attends annual [facility] exams[facility] If pt is not established with a provider, would  they like to be referred to a provider to establish care? No .   Dental Screening: Recommended annual dental exams for proper oral hygiene  Community Resource Referral / Chronic Care Management:  CRR required this visit?  No   CCM required this visit?  No      Plan:     I have personally reviewed and noted the following in the patient's chart:   Medical and social history Use of alcohol, tobacco or illicit drugs  Current medications and supplements including opioid prescriptions. Patient is not currently taking opioid prescriptions. Functional ability and status Nutritional status Physical activity Advanced directives List of other physicians Hospitalizations, surgeries, and ER visits in previous 12 months Vitals Screenings to include cognitive, depression, and falls Referrals and appointments  In addition, I have reviewed and discussed with patient certain preventive protocols, quality metrics, and best practice recommendations. A written personalized care plan for preventive services as well as general preventive health recommendations were provided to patient.     BCriselda Peaches LPN   [id number]/83/[id number]  Nurse Notes: None

## 2022-06-17 DIAGNOSIS — H40013 Open angle with borderline findings, low risk, bilateral: Secondary | ICD-10-CM | POA: Diagnosis not present

## 2022-06-20 ENCOUNTER — Telehealth: Payer: Self-pay | Admitting: Family Medicine

## 2022-06-20 DIAGNOSIS — I700 Atherosclerosis of aorta: Secondary | ICD-10-CM

## 2022-06-20 NOTE — Telephone Encounter (Addendum)
Prescription Request  06/20/2022  LOV: 12/25/2021 = CPE  What is the name of the medication or equipment? lovastatin (MEVACOR) 10 MG tablet    *Pt is requesting a 90 day refill*  Pt states he only has 2 days worth of this medication left.   Have you contacted your pharmacy to request a refill? No   Which pharmacy would you like this sent to?  [place], [place] [address] Phone: [phone] Fax: [phone]    Patient notified that their request is being sent to the clinical staff for review and that they should receive a response within 2-3 business days.   Please advise at Mobile [phone] (mobile)

## 2022-06-21 MED ORDER — LOVASTATIN 10 MG PO TABS: 10.0000 mg | ORAL_TABLET | Freq: Every day | ORAL | 3 refills | Status: DC

## 2022-06-21 NOTE — Telephone Encounter (Signed)
Rx sent in

## 2022-07-01 ENCOUNTER — Encounter: Payer: Self-pay | Admitting: Family Medicine

## 2022-07-01 ENCOUNTER — Ambulatory Visit (INDEPENDENT_AMBULATORY_CARE_PROVIDER_SITE_OTHER): Payer: Medicare HMO | Admitting: Family Medicine

## 2022-07-01 VITALS — BP 110/78 | HR 69 | Temp 98.2°F | Wt 169.0 lb

## 2022-07-01 DIAGNOSIS — J40 Bronchitis, not specified as acute or chronic: Secondary | ICD-10-CM

## 2022-07-01 DIAGNOSIS — R062 Wheezing: Secondary | ICD-10-CM

## 2022-07-01 LAB — POC COVID19 BINAXNOW: SARS Coronavirus 2 Ag: NEGATIVE

## 2022-07-01 LAB — POCT INFLUENZA A/B
Influenza A, POC: NEGATIVE
Influenza B, POC: NEGATIVE

## 2022-07-01 MED ORDER — AZITHROMYCIN 250 MG PO TABS: ORAL_TABLET | ORAL | 0 refills | Status: DC

## 2022-07-01 NOTE — Progress Notes (Signed)
   Subjective:    Patient ID: Richard Middleton, male    DOB: 1946-01-02, 77 y.o.   MRN: [id number]  HPI Here for a dry cough for the past 2 weeks. No fever or SOB. Using Tussin DM.    Review of Systems  Constitutional: Negative.   HENT: Negative.    Eyes: Negative.   Respiratory:  Positive for cough. Negative for shortness of breath and wheezing.        Objective:   Physical Exam Constitutional:      Appearance: Normal appearance. He is not ill-appearing.  HENT:     Right Ear: Tympanic membrane, ear canal and external ear normal.     Left Ear: Tympanic membrane, ear canal and external ear normal.     Nose: Nose normal.     Mouth/Throat:     Pharynx: Oropharynx is clear.  Eyes:     Conjunctiva/sclera: Conjunctivae normal.  Pulmonary:     Effort: Pulmonary effort is normal.     Breath sounds: Normal breath sounds.  Lymphadenopathy:     Cervical: No cervical adenopathy.  Neurological:     Mental Status: He is alert.           Assessment & Plan:  Bronchitis, treat with a Zpack.  Alysia Penna, MD

## 2022-07-23 DIAGNOSIS — Z0100 Encounter for examination of eyes and vision without abnormal findings: Secondary | ICD-10-CM | POA: Diagnosis not present

## 2022-07-23 DIAGNOSIS — H40013 Open angle with borderline findings, low risk, bilateral: Secondary | ICD-10-CM | POA: Diagnosis not present

## 2022-08-15 ENCOUNTER — Telehealth: Payer: Self-pay | Admitting: Family Medicine

## 2022-08-15 NOTE — Telephone Encounter (Signed)
Pt called to inform MD that he bought some OTC Nugenix testosterone male enhancement pills, and Pt would like to know if this is safe for him to take?  Please advise.

## 2022-08-16 NOTE — Telephone Encounter (Signed)
I do not see a problem at this time or possible contraindications, so I think he can start taking OTC Nugenix. Thanks, BJ

## 2022-08-16 NOTE — Telephone Encounter (Signed)
I left patient a voicemail letting him know the information below. Advised him to call back with any questions.

## 2022-09-23 ENCOUNTER — Ambulatory Visit (INDEPENDENT_AMBULATORY_CARE_PROVIDER_SITE_OTHER): Payer: Medicare HMO | Admitting: Family Medicine

## 2022-09-23 ENCOUNTER — Encounter: Payer: Self-pay | Admitting: Family Medicine

## 2022-09-23 VITALS — BP 110/70 | HR 92 | Temp 98.9°F | Ht 73.0 in | Wt 161.2 lb

## 2022-09-23 DIAGNOSIS — R630 Anorexia: Secondary | ICD-10-CM

## 2022-09-23 DIAGNOSIS — R6889 Other general symptoms and signs: Secondary | ICD-10-CM | POA: Diagnosis not present

## 2022-09-23 DIAGNOSIS — R140 Abdominal distension (gaseous): Secondary | ICD-10-CM | POA: Diagnosis not present

## 2022-09-23 DIAGNOSIS — K5900 Constipation, unspecified: Secondary | ICD-10-CM

## 2022-09-23 DIAGNOSIS — R634 Abnormal weight loss: Secondary | ICD-10-CM | POA: Diagnosis not present

## 2022-09-23 DIAGNOSIS — R5383 Other fatigue: Secondary | ICD-10-CM | POA: Diagnosis not present

## 2022-09-23 MED ORDER — SENNOSIDES-DOCUSATE SODIUM 8.6-50 MG PO TABS
1.0000 | ORAL_TABLET | Freq: Every day | ORAL | 0 refills | Status: DC
Start: 2022-09-23 — End: 2023-05-19

## 2022-09-23 NOTE — Patient Instructions (Signed)
A prescription for Colace-senna was sent to your pharmacy.  This is a medication you can take to help with constipation.  We will get labs today to further evaluate your weight loss.  Will have you follow-up in clinic with your PCP in the next 1-2 weeks.

## 2022-09-23 NOTE — Progress Notes (Signed)
Established Patient Office Visit   Subjective  Patient ID: Richard Middleton, male    DOB: 28-Mar-1946  Age: 77 y.o. MRN: [id number]  Chief Complaint  Patient presents with   Weight Loss    Pt reports he losing about 8-9lb about 3 wks without trying to lose weight.    Anorexia    Pt c/o loss of appetite and low energy. 2 wks ago.    Muscle Pain   Fatigue    Pt c/o of feeling tired in the last 4-5 days. Pt added he took total T in the last 6 days to help with stamina but it made him feel worse.     Patient is a 77 year old male followed by Dr. [place] and seen for ongoing concerns.  Patient endorses loss of appetite in the last 2 weeks.  States does not feel hungry but is making himself eat.  Patient also notes decreased energy.  States sleep is okay, not snoring bad like he used to.  Patient endorses weight loss of 10 pounds in the last few weeks.  Per chart review patient was 163 lbs September 2023, 170 lbs in March 2024, and currently 161 lbs.  Patient was unsure if his symptoms were related to eating 7-day old beans, and having expired almond milk, taking vitamin D 10,000 IUs for 2 days.  Patient is also had cold intolerance.  Patient tried Nugenix, testosterone supplement seen on TV but states it made him feel worse.  Patient is active frequently doing yard work and working over 10,000 steps per day.  Denies increased stress, changes in medications.    Past Medical History:  Diagnosis Date   Abnormal glucose    History of small bowel obstruction    x2   Pneumonia    as a child   Past Surgical History:  Procedure Laterality Date   ACHILLES TENDON SURGERY Right 05/02/2017   Procedure: RIGHT ACHILLES RECONSTRUCTION;  Surgeon: Nadara Mustard, MD;  [facility]: [facility];  Service: Orthopedics;  Laterality: Right;   COLONOSCOPY  2008   LAPAROSCOPIC SMALL BOWEL RESECTION  3 1/2 years ago, & 10 years ago   x2   LAPAROTOMY N/A 07/13/2014   Procedure: EXPLORATORY LAPAROTOMY lysis of adhesions;   Surgeon: Romie Levee, MD;  Location: [facility];  Service: General;  Laterality: N/A;   LUMBAR LAMINECTOMY/DECOMPRESSION MICRODISCECTOMY N/A 05/18/2018   Procedure: L4-5 DECOMPRESSION, RIGHT MICRODISCECTOMY;  Surgeon: Eldred Manges, MD;  Location: [facility];  Service: Orthopedics;  Laterality: N/A;   Social History   Tobacco Use   Smoking status: Former    Packs/day: 4    Types: Cigarettes    Start date: 04/15/1966    Quit date: 04/15/1970    Years since quitting: 52.4   Smokeless tobacco: Never  Vaping Use   Vaping Use: Never used  Substance Use Topics   Alcohol use: Yes    Alcohol/week: 1.0 standard drink of alcohol    Types: 1 Glasses of wine per week    Comment: 2-3 times per month    Drug use: No   Family History  Problem Relation Age of Onset   Arthritis Mother    Pulmonary embolism Father    Cancer Neg Hx    Diabetes Neg Hx    Colon cancer Neg Hx    No Known Allergies    ROS Negative unless stated above    Objective:     BP 110/70 (BP Location: Right Arm, Patient Position: Sitting, Cuff Size:  Normal)   Pulse 92   Temp 98.9 F (37.2 C) (Oral)   Ht 6\' 1"  (1.854 m)   Wt 161 lb 3.2 oz (73.1 kg)   SpO2 98%   BMI 21.27 kg/m  BP Readings from Last 3 Encounters:  09/23/22 110/70  07/01/22 110/78  12/25/21 120/80   Wt Readings from Last 3 Encounters:  09/23/22 161 lb 3.2 oz (73.1 kg)  07/01/22 169 lb (76.7 kg)  04/30/22 170 lb (77.1 kg)      Physical Exam Constitutional:      General: He is not in acute distress.    Appearance: Normal appearance.  HENT:     Head: Normocephalic and atraumatic.     Nose: Nose normal.     Mouth/Throat:     Mouth: Mucous membranes are moist.  Eyes:     Extraocular Movements: Extraocular movements intact.     Conjunctiva/sclera: Conjunctivae normal.  Cardiovascular:     Rate and Rhythm: Normal rate and regular rhythm.     Heart sounds: Normal heart sounds. No murmur heard.    No gallop.  Pulmonary:     Effort: Pulmonary  effort is normal. No respiratory distress.     Breath sounds: Normal breath sounds. No wheezing, rhonchi or rales.  Abdominal:     General: Bowel sounds are normal. There is no distension.     Palpations: Abdomen is soft.     Tenderness: There is no abdominal tenderness. There is no guarding.  Skin:    General: Skin is warm and dry.  Neurological:     Mental Status: He is alert and oriented to person, place, and time.     No results found for any visits on 09/23/22.    Assessment & Plan:  Weight loss -     Comprehensive metabolic panel -     TSH -     T4, free -     PSA, Medicare  Fatigue, unspecified type -     Comprehensive metabolic panel -     TSH -     T4, free -     CBC with Differential/Platelet -     PSA, Medicare  Abdominal bloating -     Comprehensive metabolic panel -     TSH -     T4, free  Cold intolerance of hand -     TSH -     CBC with Differential/Platelet  Loss of appetite -     Comprehensive metabolic panel  Constipation, unspecified constipation type -     Sennosides-Docusate Sodium; Take 1 tablet by mouth daily.  Dispense: 30 tablet; Refill: 0  Advised symptoms likely multifactorial.  Weight loss may be due to patient's increased physical activity and decreased appetite.  No red flag symptoms for weight loss noted.  Discussed obtaining labs to further evaluate symptoms as thyroid dysfunction, anemia, electrolyte abnormality may be contributing.  Discussed daily bowel regimen for bloating and sensation of incomplete bowel emptying.  For continued or worsening symptoms obtain imaging and consider GI referral based on results.  Patient given strict precautions due to history of SBO.   Return in about 2 weeks (around 10/07/2022) for with pcp.   Deeann Saint, MD

## 2022-09-24 LAB — T4, FREE: Free T4: 0.61 ng/dL (ref 0.60–1.60)

## 2022-09-24 LAB — COMPREHENSIVE METABOLIC PANEL
ALT: 16 U/L (ref 0–53)
AST: 22 U/L (ref 0–37)
Albumin: 3.9 g/dL (ref 3.5–5.2)
Alkaline Phosphatase: 92 U/L (ref 39–117)
BUN: 22 mg/dL (ref 6–23)
CO2: 25 mEq/L (ref 19–32)
Calcium: 9 mg/dL (ref 8.4–10.5)
Chloride: 99 mEq/L (ref 96–112)
Creatinine, Ser: 1.15 mg/dL (ref 0.40–1.50)
GFR: 62.01 mL/min (ref >60.00)
Glucose, Bld: 88 mg/dL (ref 70–99)
Potassium: 4.1 mEq/L (ref 3.5–5.1)
Sodium: 134 mEq/L — ABNORMAL LOW (ref 135–145)
Total Bilirubin: 0.4 mg/dL (ref 0.2–1.2)
Total Protein: 6.7 g/dL (ref 6.0–8.3)

## 2022-09-24 LAB — TSH: TSH: 0.94 u[IU]/mL (ref 0.35–5.50)

## 2022-09-24 LAB — CBC WITH DIFFERENTIAL/PLATELET
Basophils Absolute: 0 10*3/uL (ref 0.0–0.1)
Basophils Relative: 0.6 % (ref 0.0–3.0)
Eosinophils Absolute: 0 10*3/uL (ref 0.0–0.7)
Eosinophils Relative: 0.3 % (ref 0.0–5.0)
HCT: 43.9 % (ref 39.0–52.0)
Hemoglobin: 13.9 g/dL (ref 13.0–17.0)
Lymphocytes Relative: 24 % (ref 12.0–46.0)
Lymphs Abs: 1 10*3/uL (ref 0.7–4.0)
MCHC: 31.6 g/dL (ref 30.0–36.0)
MCV: 85.4 fl (ref 78.0–100.0)
Monocytes Absolute: 0.4 10*3/uL (ref 0.1–1.0)
Monocytes Relative: 9.6 % (ref 3.0–12.0)
Neutro Abs: 2.7 10*3/uL (ref 1.4–7.7)
Neutrophils Relative %: 65.5 % (ref 43.0–77.0)
Platelets: 160 10*3/uL (ref 150.0–400.0)
RBC: 5.14 Mil/uL (ref 4.22–5.81)
RDW: 14 % (ref 11.5–15.5)
WBC: 4.1 10*3/uL (ref 4.0–10.5)

## 2022-09-24 LAB — PSA, MEDICARE: PSA: 0.97 ng/ml (ref 0.10–4.00)

## 2022-10-04 NOTE — Progress Notes (Signed)
HPI: Richard Middleton is a 77 y.o. male, who is here today to follow on recent visit. Evaluated on 09/23/22 for wt loss. Brentlee mentions that he has been more physically active recently, walking 29-30 miles per week, and sometimes neglects to eat properly due to being occupied with various projects and gardening activities.  He also recalls consuming baked beans that were approximately six to seven days old, which his wife believes may have  caused GI symptoms. After eating the beans, he experienced a decrease in appetite and bloating, but did not have diarrhea. He has been taking Senna and a probiotic as recommended during visit 6/10 th but is unsure if he should continue taking both supplements.  Negative for abdominal pain, nausea, vomiting, blood in stool or melena.  He states that his appetite has improved slightly since the incident, but is still not back to normal levels.  He denies any significant anxiety or depression, and reports no changes in bowel habits prior to consuming the baked beans.  Lab Results  Component Value Date   PSA 0.97 09/23/2022   PSA 0.60 12/25/2021   PSA 0.41 12/22/2020    Lab Results  Component Value Date   WBC 4.1 09/23/2022   HGB 13.9 09/23/2022   HCT 43.9 09/23/2022   MCV 85.4 09/23/2022   PLT 160.0 09/23/2022   Lab Results  Component Value Date   TSH 0.94 09/23/2022   Lab Results  Component Value Date   CREATININE 1.15 09/23/2022   BUN 22 09/23/2022   NA 134 (L) 09/23/2022   K 4.1 09/23/2022   CL 99 09/23/2022   CO2 25 09/23/2022   Lab Results  Component Value Date   ALT 16 09/23/2022   AST 22 09/23/2022   ALKPHOS 92 09/23/2022   BILITOT 0.4 09/23/2022  Aortic atherosclerosis seen on imaging in the past.  Currently he is on lovastatin 10 mg daily and Aspirin 81 mg daily. Lab Results  Component Value Date   CHOL 123 12/25/2021   HDL 65.80 12/25/2021   LDLCALC 49 12/25/2021   TRIG 41.0 12/25/2021   CHOLHDL 2 12/25/2021   Review  of Systems  Constitutional:  Negative for activity change, chills and fever.  Respiratory:  Negative for cough, shortness of breath and wheezing.   Cardiovascular:  Negative for chest pain, palpitations and leg swelling.  Endocrine: Negative for cold intolerance and heat intolerance.  Genitourinary:  Negative for decreased urine volume, dysuria and hematuria.  Skin:  Negative for rash.  Neurological:  Negative for syncope, weakness and headaches.  Psychiatric/Behavioral:  Negative for confusion and hallucinations.   See other pertinent positives and negatives in HPI.  Current Outpatient Medications on File Prior to Visit  Medication Sig Dispense Refill   CHILDRENS ASPIRIN PO Take by mouth. Taking half children low dose aspirin.     lovastatin (MEVACOR) 10 MG tablet Take 1 tablet (10 mg total) by mouth at bedtime. 90 tablet 3   senna-docusate (SENOKOT-S) 8.6-50 MG tablet Take 1 tablet by mouth daily. 30 tablet 0   VITAMIN D PO Take 500 mg by mouth daily.      No current facility-administered medications on file prior to visit.   Past Medical History:  Diagnosis Date   Abnormal glucose    History of small bowel obstruction    x2   Pneumonia    as a child   No Known Allergies  Social History   Socioeconomic History   Marital status: Married  Spouse name: Not on file   Number of children: 1   Years of education: 6   Highest education level: Associate degree: occupational, Scientist, product/process development, or vocational program  Occupational History   Occupation: retired  Tobacco Use   Smoking status: Former    Packs/day: 4    Types: Cigarettes    Start date: 04/15/1966    Quit date: 04/15/1970    Years since quitting: 52.5   Smokeless tobacco: Never  Vaping Use   Vaping Use: Never used  Substance and Sexual Activity   Alcohol use: Yes    Alcohol/week: 1.0 standard drink of alcohol    Types: 1 Glasses of wine per week    Comment: 2-3 times per month    Drug use: No   Sexual activity: Not on  file  Other Topics Concern   Not on file  Social History Narrative   Not on file   Social Determinants of Health   Financial Resource Strain: Low Risk  (04/30/2022)   Overall Financial Resource Strain (CARDIA)    Difficulty of Paying Living Expenses: Not hard at all  Food Insecurity: No Food Insecurity (04/30/2022)   Hunger Vital Sign    Worried About Running Out of Food in the Last Year: Never true    Ran Out of Food in the Last Year: Never true  Transportation Needs: No Transportation Needs (04/30/2022)   PRAPARE - Administrator, Civil Service (Medical): No    Lack of Transportation (Non-Medical): No  Physical Activity: Sufficiently Active (04/30/2022)   Exercise Vital Sign    Days of Exercise per Week: 7 days    Minutes of Exercise per Session: 60 min  Stress: No Stress Concern Present (04/30/2022)   [employer] of Occupational Health - Occupational Stress Questionnaire    Feeling of Stress : Not at all  Social Connections: Socially Integrated (04/30/2022)   Social Connection and Isolation Panel [NHANES]    Frequency of Communication with Friends and Family: More than three times a week    Frequency of Social Gatherings with Friends and Family: More than three times a week    Attends Religious Services: More than 4 times per year    Active Member of Clubs or Organizations: Yes    Attends Banker Meetings: More than 4 times per year    Marital Status: Married   Vitals:   10/07/22 0952  BP: 120/74  Pulse: 79  Resp: 16  SpO2: 98%   Wt Readings from Last 3 Encounters:  10/07/22 164 lb 4 oz (74.5 kg)  09/23/22 161 lb 3.2 oz (73.1 kg)  07/01/22 169 lb (76.7 kg)   Body mass index is 21.67 kg/m.  Physical Exam Vitals and nursing note reviewed.  Constitutional:      General: He is not in acute distress.    Appearance: He is well-developed.  HENT:     Head: Normocephalic and atraumatic.     Mouth/Throat:     Mouth: Mucous membranes are  moist.  Eyes:     Conjunctiva/sclera: Conjunctivae normal.  Neck:     Thyroid: No thyroid mass.  Cardiovascular:     Rate and Rhythm: Normal rate and regular rhythm.     Heart sounds: No murmur heard. Pulmonary:     Effort: Pulmonary effort is normal. No respiratory distress.     Breath sounds: Normal breath sounds.  Abdominal:     Palpations: Abdomen is soft. There is no hepatomegaly or mass.  Tenderness: There is no abdominal tenderness.  Lymphadenopathy:     Cervical: No cervical adenopathy.     Upper Body:     Right upper body: No supraclavicular adenopathy.     Left upper body: No supraclavicular adenopathy.  Skin:    General: Skin is warm.     Findings: No erythema or rash.  Neurological:     Mental Status: He is alert and oriented to person, place, and time.     Cranial Nerves: No cranial nerve deficit.     Gait: Gait normal.  Psychiatric:        Mood and Affect: Mood and affect normal.   ASSESSMENT AND PLAN:  Mr.Xyler was seen today for follow-up.  Diagnoses and all orders for this visit:  Weight loss, non-intentional We discussed possible etiologies, most likely due to decreased appetite/oral intake. Since his last visit he has gained about 3 pounds. Continue monitoring weight at home. Eat at least 3 meals daily and 1 boost per day in between meals.  Loss of appetite It has improved but not yet at its baseline. We discussed pharmacologic options, mirtazapine, as well as side effects.  For now we decided to hold on medication.  Abdominal bloating Problem has improved. Recommend discontinuing Senokot and daily probiotic. Adequate fiber and fluid intake. Blood work done recently with no significant abnormalities. Colonoscopy 04/2016.  Atherosclerosis of aorta (HCC) Continue lovastatin 10 mg daily and Aspirin 81 mg daily.  I spent a total of 34 minutes in both face to face and non face to face activities for this visit on the date of this encounter.  During this time history was obtained and documented, examination was performed, prior labs reviewed, and assessment/plan discussed.  Return in about 3 months (around 01/07/2023) for CPE.  Renise Gillies G. [place], MD  [facility]. [place] office.

## 2022-10-07 ENCOUNTER — Ambulatory Visit (INDEPENDENT_AMBULATORY_CARE_PROVIDER_SITE_OTHER): Payer: Medicare HMO | Admitting: Family Medicine

## 2022-10-07 ENCOUNTER — Encounter: Payer: Self-pay | Admitting: Family Medicine

## 2022-10-07 VITALS — BP 120/74 | HR 79 | Resp 16 | Ht 73.0 in | Wt 164.2 lb

## 2022-10-07 DIAGNOSIS — I700 Atherosclerosis of aorta: Secondary | ICD-10-CM

## 2022-10-07 DIAGNOSIS — R634 Abnormal weight loss: Secondary | ICD-10-CM | POA: Diagnosis not present

## 2022-10-07 DIAGNOSIS — R140 Abdominal distension (gaseous): Secondary | ICD-10-CM | POA: Diagnosis not present

## 2022-10-07 DIAGNOSIS — R630 Anorexia: Secondary | ICD-10-CM

## 2022-10-07 NOTE — Patient Instructions (Addendum)
A few things to remember from today's visit:  Weight loss, non-intentional  Atherosclerosis of aorta (HCC), Chronic Stop Senakot and probiotic. 3 schedule meals per day and one boost per day in between meals. No changes in Lovastatin. I will see you back in 12/2022 for your physical.  Do not use My Chart to request refills or for acute issues that need immediate attention. If you send a my chart message, it may take a few days to be addressed, specially if I am not in the office.  Please be sure medication list is accurate. If a new problem present, please set up appointment sooner than planned today.

## 2022-11-21 ENCOUNTER — Encounter: Payer: Self-pay | Admitting: Adult Health

## 2022-11-21 ENCOUNTER — Telehealth (INDEPENDENT_AMBULATORY_CARE_PROVIDER_SITE_OTHER): Payer: Medicare HMO | Admitting: Adult Health

## 2022-11-21 DIAGNOSIS — U071 COVID-19: Secondary | ICD-10-CM | POA: Diagnosis not present

## 2022-11-21 NOTE — Progress Notes (Signed)
Virtual Visit via Video Note  I connected with Richard Middleton on 11/21/22 at 11:30 AM EDT by a video enabled telemedicine application and verified that I am speaking with the correct person using two identifiers.  Location patient: home Location provider:work or home office Persons participating in the virtual visit: patient, provider  I discussed the limitations of evaluation and management by telemedicine and the availability of in person appointments. The patient expressed understanding and agreed to proceed.   HPI:  He has been evaluated today for for testing positive for COVID-19 yesterday.  His symptoms started 2 days prior, initially with a fever which has since resolved.  He does have headache, body aches a little bit of cough mild shortness of breath and decreased energy.  Symptoms seem to be pretty mild.  He has been using Tylenol which helps overall with his symptoms.  ROS: See pertinent positives and negatives per HPI.  Past Medical History:  Diagnosis Date   Abnormal glucose    History of small bowel obstruction    x2   Pneumonia    as a child    Past Surgical History:  Procedure Laterality Date   ACHILLES TENDON SURGERY Right 05/02/2017   Procedure: RIGHT ACHILLES RECONSTRUCTION;  Surgeon: Nadara Mustard, MD;  Location: [facility] OR;  Service: Orthopedics;  Laterality: Right;   COLONOSCOPY  2008   LAPAROSCOPIC SMALL BOWEL RESECTION  3 1/2 years ago, & 10 years ago   x2   LAPAROTOMY N/A 07/13/2014   Procedure: EXPLORATORY LAPAROTOMY lysis of adhesions;  Surgeon: Romie Levee, MD;  Location: [facility] ORS;  Service: General;  Laterality: N/A;   LUMBAR LAMINECTOMY/DECOMPRESSION MICRODISCECTOMY N/A 05/18/2018   Procedure: L4-5 DECOMPRESSION, RIGHT MICRODISCECTOMY;  Surgeon: Eldred Manges, MD;  Location: [facility] OR;  Service: Orthopedics;  Laterality: N/A;    Family History  Problem Relation Age of Onset   Arthritis Mother    Pulmonary embolism Father    Cancer Neg Hx    Diabetes Neg Hx     Colon cancer Neg Hx        Current Outpatient Medications:    CHILDRENS ASPIRIN PO, Take by mouth. Taking half children low dose aspirin., Disp: , Rfl:    lovastatin (MEVACOR) 10 MG tablet, Take 1 tablet (10 mg total) by mouth at bedtime., Disp: 90 tablet, Rfl: 3   senna-docusate (SENOKOT-S) 8.6-50 MG tablet, Take 1 tablet by mouth daily., Disp: 30 tablet, Rfl: 0   VITAMIN D PO, Take 500 mg by mouth daily. , Disp: , Rfl:   EXAM:  VITALS per patient if applicable:  GENERAL: alert, oriented, appears well and in no acute distress  HEENT: atraumatic, conjunttiva clear, no obvious abnormalities on inspection of external nose and ears  NECK: normal movements of the head and neck  LUNGS: on inspection no signs of respiratory distress, breathing rate appears normal, no obvious gross SOB, gasping or wheezing  CV: no obvious cyanosis  MS: moves all visible extremities without noticeable abnormality  PSYCH/NEURO: pleasant and cooperative, no obvious depression or anxiety, speech and thought processing grossly intact  ASSESSMENT AND PLAN:  Discussed the following assessment and plan:   1. COVID-19 -He has a rather healthy 77 year old male with no pulmonary history.  We discussed starting Paxlovid but using shared decision making decided against treatment since his symptoms were mild.  Advise Mucinex for cough, can continue with Tylenol.  Advised rest and hydration, he should start to feel better in the next day or 2.  He  understands to follow-up if symptoms worsen over the next 24 hours.    I discussed the assessment and treatment plan with the patient. The patient was provided an opportunity to ask questions and all were answered. The patient agreed with the plan and demonstrated an understanding of the instructions.   The patient was advised to call back or seek an in-person evaluation if the symptoms worsen or if the condition fails to improve as anticipated.   Shirline Frees, NP

## 2022-11-28 ENCOUNTER — Encounter (INDEPENDENT_AMBULATORY_CARE_PROVIDER_SITE_OTHER): Payer: Self-pay

## 2022-12-25 NOTE — Progress Notes (Signed)
HPI:  [name] is a 77 y.o.male with PMHx significant for aortic atherosclerosis, prediabetes, and BPH here today for his routine physical examination.  Last CPE: 12/25/21  He follows a healthful diet, he eats mainly chicken, [place], and fish.  Occasionally he eats steak. He eats vegetables daily. He engages in regular physical activity, including 15 minutes of bending and stretching exercises each morning. Sometimes he drinks a protein boost if he skips a meal. He sleeps for an average of 7.5 to 8 hours per night and rarely consumes alcohol. He quit smoking in 1972.  Immunization History  Administered Date(s) Administered   Fluad Quad(high Dose 65+) 02/10/2020, 12/22/2020   Fluad Trivalent(High Dose 65+) 12/27/2022   Influenza Split 01/24/2012   Influenza Whole 01/12/2007, 01/05/2008, 02/01/2009, 01/09/2010   Influenza, High Dose Seasonal PF 12/22/2015, 01/19/2018   Influenza,inj,Quad PF,6+ Mos 01/11/2013   Influenza-Unspecified 01/19/2018, 01/20/2019   PFIZER(Purple Top)SARS-COV-2 Vaccination 05/20/2019, 06/10/2019, 01/16/2021   Pneumococcal Conjugate-13 08/18/2015   Pneumococcal Polysaccharide-23 01/24/2012, 08/27/2017, 02/10/2020   Td 08/01/2006   Zoster Recombinant(Shingrix) 01/20/2019, 01/20/2019   Zoster, Live 07/21/2007   Health Maintenance  Topic Date Due   COVID-19 Vaccine (4 - 2023-24 season) 12/15/2022   Medicare Annual Wellness (AWV)  05/01/2023   Pneumonia Vaccine 2+ Years old  Completed   INFLUENZA VACCINE  Completed   Hepatitis C Screening  Completed   HPV VACCINES  Aged Out   DTaP/Tdap/Td  Discontinued   Colonoscopy  Discontinued   Zoster Vaccines- Shingrix  Discontinued   BPH: Nocturia stable, 1 to twice per night. Negative for increase in urinary frequency or gross hematuria. Lab Results  Component Value Date   PSA 0.97 09/23/2022   PSA 0.60 12/25/2021   PSA 0.41 12/22/2020   Prediabetes: Negative for polydipsia, polyphagia, or  polyuria. Lab Results  Component Value Date   HGBA1C 6.1 12/25/2021   Aortic atherosclerosis: Currently he is on lovastatin 10 mg daily. He is also taking Aspirin 81 mg one half daily.  Lab Results  Component Value Date   CHOL 123 12/25/2021   HDL 65.80 12/25/2021   LDLCALC 49 12/25/2021   TRIG 41.0 12/25/2021   CHOLHDL 2 12/25/2021   Lab Results  Component Value Date   NA 134 (L) 09/23/2022   CL 99 09/23/2022   K 4.1 09/23/2022   CO2 25 09/23/2022   BUN 22 09/23/2022   CREATININE 1.15 09/23/2022   GFR 62.01 09/23/2022   CALCIUM 9.0 09/23/2022   ALBUMIN 3.9 09/23/2022   GLUCOSE 88 09/23/2022   Lab Results  Component Value Date   ALT 16 09/23/2022   AST 22 09/23/2022   ALKPHOS 92 09/23/2022   BILITOT 0.4 09/23/2022   Review of Systems  Constitutional:  Negative for activity change, appetite change and fever.  HENT:  Negative for dental problem, nosebleeds, sore throat and trouble swallowing.   Eyes:  Negative for redness and visual disturbance.  Respiratory:  Negative for cough, shortness of breath and wheezing.   Cardiovascular:  Negative for chest pain, palpitations and leg swelling.  Gastrointestinal:  Negative for abdominal pain, blood in stool, nausea and vomiting.  Endocrine: Negative for cold intolerance and heat intolerance.  Genitourinary:  Negative for decreased urine volume, dysuria, genital sores, hematuria and testicular pain.  Musculoskeletal:  Positive for back pain. Negative for gait problem.  Skin:  Negative for color change and rash.  Allergic/Immunologic: Positive for environmental allergies.  Neurological:  Negative for syncope, weakness and headaches.  Hematological:  Negative for adenopathy. Does not bruise/bleed easily.  Psychiatric/Behavioral:  Negative for confusion and sleep disturbance.    Current Outpatient Medications on File Prior to Visit  Medication Sig Dispense Refill   CHILDRENS ASPIRIN PO Take by mouth. Taking half children low  dose aspirin.     lovastatin (MEVACOR) 10 MG tablet Take 1 tablet (10 mg total) by mouth at bedtime. 90 tablet 3   senna-docusate (SENOKOT-S) 8.6-50 MG tablet Take 1 tablet by mouth daily. 30 tablet 0   VITAMIN D PO Take 500 mg by mouth daily.      No current facility-administered medications on file prior to visit.   Past Medical History:  Diagnosis Date   Abnormal glucose    History of small bowel obstruction    x2   Pneumonia    as a child   Past Surgical History:  Procedure Laterality Date   ACHILLES TENDON SURGERY Right 05/02/2017   Procedure: RIGHT ACHILLES RECONSTRUCTION;  Surgeon: Nadara Mustard, MD;  Location: [facility] OR;  Service: Orthopedics;  Laterality: Right;   COLONOSCOPY  2008   LAPAROSCOPIC SMALL BOWEL RESECTION  3 1/2 years ago, & 10 years ago   x2   LAPAROTOMY N/A 07/13/2014   Procedure: EXPLORATORY LAPAROTOMY lysis of adhesions;  Surgeon: Romie Levee, MD;  Location: [facility] ORS;  Service: General;  Laterality: N/A;   LUMBAR LAMINECTOMY/DECOMPRESSION MICRODISCECTOMY N/A 05/18/2018   Procedure: L4-5 DECOMPRESSION, RIGHT MICRODISCECTOMY;  Surgeon: Eldred Manges, MD;  Location: [facility] OR;  Service: Orthopedics;  Laterality: N/A;   No Known Allergies  Family History  Problem Relation Age of Onset   Arthritis Mother    Pulmonary embolism Father    Cancer Neg Hx    Diabetes Neg Hx    Colon cancer Neg Hx    Social History   Socioeconomic History   Marital status: Married    Spouse name: Not on file   Number of children: 1   Years of education: 6   Highest education level: Associate degree: occupational, Scientist, product/process development, or vocational program  Occupational History   Occupation: retired  Tobacco Use   Smoking status: Former    Current packs/day: 0.00    Average packs/day: 4.0 packs/day for 4.0 years (16.0 ttl pk-yrs)    Types: Cigarettes    Start date: 04/15/1966    Quit date: 04/15/1970    Years since quitting: 52.7   Smokeless tobacco: Never  Vaping Use   Vaping status:  Never Used  Substance and Sexual Activity   Alcohol use: Yes    Alcohol/week: 1.0 standard drink of alcohol    Types: 1 Glasses of wine per week    Comment: 2-3 times per month    Drug use: No   Sexual activity: Not on file  Other Topics Concern   Not on file  Social History Narrative   Not on file   Social Determinants of Health   Financial Resource Strain: Low Risk  (04/30/2022)   Overall Financial Resource Strain (CARDIA)    Difficulty of Paying Living Expenses: Not hard at all  Food Insecurity: No Food Insecurity (04/30/2022)   Hunger Vital Sign    Worried About Running Out of Food in the Last Year: Never true    Ran Out of Food in the Last Year: Never true  Transportation Needs: No Transportation Needs (04/30/2022)   PRAPARE - Administrator, Civil Service (Medical): No    Lack of Transportation (Non-Medical): No  Physical Activity: Sufficiently Active (04/30/2022)  Exercise Vital Sign    Days of Exercise per Week: 7 days    Minutes of Exercise per Session: 60 min  Stress: No Stress Concern Present (04/30/2022)   [employer] of Occupational Health - Occupational Stress Questionnaire    Feeling of Stress : Not at all  Social Connections: Socially Integrated (04/30/2022)   Social Connection and Isolation Panel [NHANES]    Frequency of Communication with Friends and Family: More than three times a week    Frequency of Social Gatherings with Friends and Family: More than three times a week    Attends Religious Services: More than 4 times per year    Active Member of [employer] or Organizations: Yes    Attends Banker Meetings: More than 4 times per year    Marital Status: Married   Vitals:   12/27/22 0901  BP: 120/74  Pulse: 80  Resp: 16  Temp: 98.3 F (36.8 C)  SpO2: 97%   Body mass index is 21.44 kg/m.  Wt Readings from Last 3 Encounters:  12/27/22 162 lb 8 oz (73.7 kg)  10/07/22 164 lb 4 oz (74.5 kg)  09/23/22 161 lb 3.2 oz (73.1 kg)    Physical Exam Vitals and nursing note reviewed.  Constitutional:      General: He is not in acute distress.    Appearance: He is well-developed.  HENT:     Head: Normocephalic and atraumatic.     Right Ear: Tympanic membrane, ear canal and external ear normal.     Left Ear: Tympanic membrane, ear canal and external ear normal.     Mouth/Throat:     Mouth: Mucous membranes are moist.     Pharynx: Oropharynx is clear.  Eyes:     Conjunctiva/sclera: Conjunctivae normal.     Pupils: Pupils are equal, round, and reactive to light.  Neck:     Thyroid: No thyroid mass.  Cardiovascular:     Rate and Rhythm: Normal rate and regular rhythm.     Pulses:          Dorsalis pedis pulses are 2+ on the right side and 2+ on the left side.     Heart sounds: No murmur heard. Pulmonary:     Effort: Pulmonary effort is normal. No respiratory distress.     Breath sounds: Normal breath sounds.  Abdominal:     Palpations: Abdomen is soft. There is no hepatomegaly or mass.     Tenderness: There is no abdominal tenderness.  Genitourinary:    Comments: No concerns. Musculoskeletal:        General: No tenderness.     Cervical back: Normal range of motion.     Comments: No major deformities appreciated and no signs of synovitis.  Lymphadenopathy:     Cervical: No cervical adenopathy.  Skin:    General: Skin is warm.     Findings: No erythema.  Neurological:     General: No focal deficit present.     Mental Status: He is alert and oriented to person, place, and time.     Cranial Nerves: No cranial nerve deficit.     Sensory: No sensory deficit.     Gait: Gait normal.     Deep Tendon Reflexes:     Reflex Scores:      Bicep reflexes are 2+ on the right side and 2+ on the left side.      Patellar reflexes are 2+ on the right side and 2+ on the left side. Psychiatric:  Mood and Affect: Mood and affect normal.   ASSESSMENT AND PLAN:  RichardMiddleton was seen today for annual exam.  Diagnoses  and all orders for this visit: Orders Placed This Encounter  Procedures   Flu Vaccine Trivalent High Dose (Fluad)   Hemoglobin A1c   Lipid panel   Lab Results  Component Value Date   HGBA1C 5.9 12/27/2022   Lab Results  Component Value Date   CHOL 110 12/27/2022   HDL 61.60 12/27/2022   LDLCALC 39 12/27/2022   TRIG 49.0 12/27/2022   CHOLHDL 2 12/27/2022   Routine general medical examination at a health care facility Assessment & Plan: We discussed the importance of regular physical activity and healthy diet for prevention of chronic illness and/or complications. Preventive guidelines reviewed.  He has has some concerns about weight loss, recently lost some as a result of acute illness (COVID-19 infection), recommend a daily boost. Vaccination up-to-date. Next CPE in a year.   Need for influenza vaccination -     Flu Vaccine Trivalent High Dose (Fluad)  Atherosclerosis of aorta [facility]) Assessment & Plan: Seen on imaging, abdominal CT in 09/2018. Currently on atorvastatin 10 mg daily. Last LDL 49 in 12/2021. He is taking Aspirin 81 mg one half daily, recommend to discontinue it.  Side effects discussed.  Orders: -     Lipid panel; Future  Prediabetes Assessment & Plan: Hemoglobin A1c was 6.1 in 12/2021. Encouraged consistency with a healthy lifestyle for diabetes prevention. Further recommendation will be given according to hemoglobin A1c result.  Orders: -     Hemoglobin A1c; Future  Return in 1 year (on 12/27/2023) for CPE.  Loran Auguste G. [place], MD  [facility]. [place] office.

## 2022-12-27 ENCOUNTER — Ambulatory Visit (INDEPENDENT_AMBULATORY_CARE_PROVIDER_SITE_OTHER): Payer: Medicare HMO | Admitting: Family Medicine

## 2022-12-27 ENCOUNTER — Encounter: Payer: Self-pay | Admitting: Family Medicine

## 2022-12-27 VITALS — BP 120/74 | HR 80 | Temp 98.3°F | Resp 16 | Ht 73.0 in | Wt 162.5 lb

## 2022-12-27 DIAGNOSIS — Z23 Encounter for immunization: Secondary | ICD-10-CM | POA: Diagnosis not present

## 2022-12-27 DIAGNOSIS — I700 Atherosclerosis of aorta: Secondary | ICD-10-CM

## 2022-12-27 DIAGNOSIS — Z0000 Encounter for general adult medical examination without abnormal findings: Secondary | ICD-10-CM

## 2022-12-27 DIAGNOSIS — R7303 Prediabetes: Secondary | ICD-10-CM

## 2022-12-27 LAB — LIPID PANEL
Cholesterol: 110 mg/dL (ref 0–200)
HDL: 61.6 mg/dL (ref >39.00)
LDL Cholesterol: 39 mg/dL (ref 0–99)
NonHDL: 48.68
Total CHOL/HDL Ratio: 2
Triglycerides: 49 mg/dL (ref 0.0–149.0)
VLDL: 9.8 mg/dL (ref 0.0–40.0)

## 2022-12-27 LAB — HEMOGLOBIN A1C: Hgb A1c MFr Bld: 5.9 % (ref 4.6–6.5)

## 2022-12-27 NOTE — Patient Instructions (Addendum)
A few things to remember from today's visit:  Routine general medical examination at a health care facility  Need for influenza vaccination - Plan: Flu Vaccine Trivalent High Dose (Fluad)  Atherosclerosis of aorta (HCC) - Plan: Lipid panel  Prediabetes - Plan: Hemoglobin A1c  Tdap at your pharmacy. Stop aspirin. No changes in Lovastatin.  If you need refills for medications you take chronically, please call your pharmacy. Do not use My Chart to request refills or for acute issues that need immediate attention. If you send a my chart message, it may take a few days to be addressed, specially if I am not in the office.  Please be sure medication list is accurate. If a new problem present, please set up appointment sooner than planned today.

## 2022-12-27 NOTE — Assessment & Plan Note (Addendum)
We discussed the importance of regular physical activity and healthy diet for prevention of chronic illness and/or complications. Preventive guidelines reviewed.  He has has some concerns about weight loss, recently lost some as a result of acute illness (COVID-19 infection), recommend a daily boost. Vaccination up-to-date. Next CPE in a year.

## 2022-12-27 NOTE — Assessment & Plan Note (Signed)
Seen on imaging, abdominal CT in 09/2018. Currently on atorvastatin 10 mg daily. Last LDL 49 in 12/2021. He is taking Aspirin 81 mg one half daily, recommend to discontinue it.  Side effects discussed.

## 2022-12-27 NOTE — Assessment & Plan Note (Signed)
Hemoglobin A1c was 6.1 in 12/2021. Encouraged consistency with a healthy lifestyle for diabetes prevention. Further recommendation will be given according to hemoglobin A1c result.

## 2023-01-20 DIAGNOSIS — H40013 Open angle with borderline findings, low risk, bilateral: Secondary | ICD-10-CM | POA: Diagnosis not present

## 2023-05-07 ENCOUNTER — Ambulatory Visit: Payer: Self-pay | Admitting: Family Medicine

## 2023-05-07 NOTE — Telephone Encounter (Signed)
Copied from CRM [id number]. Topic: Clinical - Red Word Triage >> May 07, 2023  9:49 AM Steele Sizer wrote: Red Word that prompted transfer to Nurse Triage:  Pt stated that after sitting down for a period of time and laying down when he stands up he feels like the room is spinning. He stated that he is feeling dizzy, and would like to be scheduled. The symptoms of  the dizziness started yesterday.   Chief Complaint: Dizziness Symptoms: Dizziness, slight headaches that come and go for a few days but felt better after drinking water, neurological deficits such as numbness/weakness/trouble speaking Frequency: Started yesterday morning 05/06/2023 Pertinent Negatives: Patient denies Nausea, vomiting, diarrhea, chest pain, abdominal pain, difficulty breathing. Disposition: [] ED /[] Urgent Care (no appt availability in office) / [x] Appointment(In office/virtual)/ []  [facility]/ [] Home Care/ [] Refused Recommended Disposition /[] [facility]/ []  Follow-up with PCP Additional Notes: Patient called and advised that he started experiencing some dizziness that started yesterday morning after doing some routine stretching exercises.  It happened again this morning and it feels like the room is spinning or moving.  He feels like his equilibrium is off.  Once he gets up and gets going after a little bit it will mostly go away.  He states that he feels like he has to wait it out.  At this time, patient denies any symptoms.  He denies nausea, vomiting, diarrhea, dizziness, abdominal pain, chest pain, difficulty breathing. Patient is advised that the recommendation is to be seen by a provider in the next 24 hours for his symptoms.  He is also advised that if anything worsens or he has another episode to either call 911 or go to the emergency room.  Patient verbalized understanding.  Reason for Disposition  [1] NO dizziness now AND [2] age > 38  Answer Assessment - Initial Assessment Questions 1.  DESCRIPTION: "Describe your dizziness."     Pt states it felt like the room was spinning 2. VERTIGO: "Do you feel like either you or the room is spinning or tilting?"      spinning 3. LIGHTHEADED: "Do you feel lightheaded?" (e.g., somewhat faint, woozy, weak upon standing)     Feels "woozy" 4. SEVERITY: "How bad is it?"  "Can you walk?"   - MILD: Feels slightly dizzy and unsteady, but is walking normally.   - MODERATE: Feels unsteady when walking, but not falling; interferes with normal activities (e.g., school, work).   - SEVERE: Unable to walk without falling, or requires assistance to walk without falling.     Severe until it subsided--no symptoms right now 5. ONSET:  "When did the dizziness begin?"     Yesterday morning (05/06/2023) 6. AGGRAVATING FACTORS: "Does anything make it worse?" (e.g., standing, change in head position)     Standing made it worse.  Laying down or sitting for a while seemed to trigger it. 7. CAUSE: "What do you think is causing the dizziness?"     Unknown 8. RECURRENT SYMPTOM: "Have you had dizziness before?" If Yes, ask: "When was the last time?" "What happened that time?"     N/A 9. OTHER SYMPTOMS: "Do you have any other symptoms?" (e.g., headache, weakness, numbness, vomiting, earache)     Slight headaches the past few days that come and go.  After drinking water last night, his headache went away.  Protocols used: Dizziness - Vertigo-A-AH

## 2023-05-07 NOTE — Telephone Encounter (Signed)
Noted  

## 2023-05-08 ENCOUNTER — Ambulatory Visit (INDEPENDENT_AMBULATORY_CARE_PROVIDER_SITE_OTHER): Payer: Medicare Other | Admitting: Internal Medicine

## 2023-05-08 ENCOUNTER — Encounter: Payer: Self-pay | Admitting: Internal Medicine

## 2023-05-08 ENCOUNTER — Other Ambulatory Visit: Payer: Self-pay

## 2023-05-08 ENCOUNTER — Encounter: Payer: Self-pay | Admitting: Physical Therapy

## 2023-05-08 ENCOUNTER — Ambulatory Visit: Payer: Medicare Other | Attending: Internal Medicine | Admitting: Physical Therapy

## 2023-05-08 VITALS — BP 144/95 | HR 76 | Temp 97.4°F | Wt 170.5 lb

## 2023-05-08 DIAGNOSIS — R42 Dizziness and giddiness: Secondary | ICD-10-CM | POA: Diagnosis not present

## 2023-05-08 DIAGNOSIS — H8110 Benign paroxysmal vertigo, unspecified ear: Secondary | ICD-10-CM

## 2023-05-08 MED ORDER — MECLIZINE HCL 12.5 MG PO TABS: 12.5000 mg | ORAL_TABLET | Freq: Three times a day (TID) | ORAL | 0 refills | Status: DC | PRN

## 2023-05-08 NOTE — Progress Notes (Signed)
Established Patient Office Visit     CC/Reason for Visit: Vertigo  HPI: Richard Middleton is a 78 y.o. male who is coming in today for the above mentioned reasons.  Yesterday he had the abrupt onset of vertigo while doing stretching exercises at home.  He described it as the room spinning around him.  Was a little nauseous.  He felt like his balance was off.  Episode lasted maybe around 3 minutes although "it felt like a lifetime".  No recent URI symptoms.  Has never had this before.                                                                                                                                                                             Past Medical/Surgical History: Past Medical History:  Diagnosis Date   Abnormal glucose    History of small bowel obstruction    x2   Pneumonia    as a child    Past Surgical History:  Procedure Laterality Date   ACHILLES TENDON SURGERY Right 05/02/2017   Procedure: RIGHT ACHILLES RECONSTRUCTION;  Surgeon: Nadara Mustard, MD;  Location: [facility] OR;  Service: Orthopedics;  Laterality: Right;   COLONOSCOPY  2008   LAPAROSCOPIC SMALL BOWEL RESECTION  3 1/2 years ago, & 10 years ago   x2   LAPAROTOMY N/A 07/13/2014   Procedure: EXPLORATORY LAPAROTOMY lysis of adhesions;  Surgeon: Romie Levee, MD;  Location: [facility] ORS;  Service: General;  Laterality: N/A;   LUMBAR LAMINECTOMY/DECOMPRESSION MICRODISCECTOMY N/A 05/18/2018   Procedure: L4-5 DECOMPRESSION, RIGHT MICRODISCECTOMY;  Surgeon: Eldred Manges, MD;  Location: [facility] OR;  Service: Orthopedics;  Laterality: N/A;    Social History:  reports that he quit smoking about 53 years ago. His smoking use included cigarettes. He started smoking about 57 years ago. He has a 16 pack-year smoking history. He has never used smokeless tobacco. He reports current alcohol use of about 1.0 standard drink of alcohol per week. He reports that he does not use drugs.  Allergies: No Known Allergies  Family History:   Family History  Problem Relation Age of Onset   Arthritis Mother    Pulmonary embolism Father    Cancer Neg Hx    Diabetes Neg Hx    Colon cancer Neg Hx      Current Outpatient Medications:    CHILDRENS ASPIRIN PO, Take by mouth. Taking half children low dose aspirin., Disp: , Rfl:    lovastatin (MEVACOR) 10 MG tablet, Take 1 tablet (10 mg total) by mouth at bedtime., Disp: 90 tablet, Rfl: 3   meclizine (ANTIVERT) 12.5 MG tablet, Take 1 tablet (12.5 mg total) by mouth 3 (three) times daily  as needed for dizziness., Disp: 30 tablet, Rfl: 0   senna-docusate (SENOKOT-S) 8.6-50 MG tablet, Take 1 tablet by mouth daily., Disp: 30 tablet, Rfl: 0   VITAMIN D PO, Take 500 mg by mouth daily. , Disp: , Rfl:   Review of Systems:  Negative unless indicated in HPI.   Physical Exam: Vitals:   05/08/23 1037 05/08/23 1042  BP: (!) 140/98 (!) 144/95  Pulse: 76   Temp: (!) 97.4 F (36.3 C)   TempSrc: Oral   SpO2: 98%   Weight: 170 lb 8 oz (77.3 kg)     Body mass index is 22.49 kg/m.   Physical Exam Vitals reviewed.  Constitutional:      Appearance: Normal appearance.  HENT:     Head: Normocephalic and atraumatic.     Right Ear: Tympanic membrane normal.     Left Ear: Tympanic membrane normal.  Eyes:     Conjunctiva/sclera: Conjunctivae normal.  Skin:    General: Skin is warm and dry.  Neurological:     General: No focal deficit present.     Mental Status: He is alert and oriented to person, place, and time.  Psychiatric:        Mood and Affect: Mood normal.        Behavior: Behavior normal.        Thought Content: Thought content normal.        Judgment: Judgment normal.      Impression and Plan:  Benign paroxysmal positional vertigo, unspecified laterality -     Referral to Neuro Rehab -     Meclizine HCl; Take 1 tablet (12.5 mg total) by mouth 3 (three) times daily as needed for dizziness.  Dispense: 30 tablet; Refill: 0  -Suspect this to be be BPPV based on  description.  Have advised he follow-up with vestibular rehab and will place referral.  Meclizine as needed will be sent.   Time spent:30 minutes reviewing chart, interviewing and examining patient and formulating plan of care.     Chaya Jan, MD [facility] at [facility]

## 2023-05-08 NOTE — Therapy (Signed)
OUTPATIENT PHYSICAL THERAPY VESTIBULAR EVALUATION     Patient Name: Richard Middleton MRN: [id number] DOB:21-Dec-1945, 78 y.o., male Today's Date: 05/08/2023  END OF SESSION:  PT End of Session - 05/08/23 1619     Visit Number 1    Number of Visits 9    Date for PT Re-Evaluation 06/06/23    Authorization Type [employer] Medicare    PT Start Time 1445    PT Stop Time 1544    PT Time Calculation (min) 59 min    Activity Tolerance Patient tolerated treatment well    Behavior During Therapy WFL for tasks assessed/performed             Past Medical History:  Diagnosis Date   Abnormal glucose    History of small bowel obstruction    x2   Pneumonia    as a child   Past Surgical History:  Procedure Laterality Date   ACHILLES TENDON SURGERY Right 05/02/2017   Procedure: RIGHT ACHILLES RECONSTRUCTION;  Surgeon: Nadara Mustard, MD;  Location: [facility] OR;  Service: Orthopedics;  Laterality: Right;   COLONOSCOPY  2008   LAPAROSCOPIC SMALL BOWEL RESECTION  3 1/2 years ago, & 10 years ago   x2   LAPAROTOMY N/A 07/13/2014   Procedure: EXPLORATORY LAPAROTOMY lysis of adhesions;  Surgeon: Romie Levee, MD;  Location: [facility];  Service: General;  Laterality: N/A;   LUMBAR LAMINECTOMY/DECOMPRESSION MICRODISCECTOMY N/A 05/18/2018   Procedure: L4-5 DECOMPRESSION, RIGHT MICRODISCECTOMY;  Surgeon: Eldred Manges, MD;  Location: [facility] OR;  Service: Orthopedics;  Laterality: N/A;   Patient Active Problem List   Diagnosis Date Noted   Atherosclerosis of aorta (HCC) 12/03/2019   Prediabetes 12/03/2019   Partial small bowel obstruction (HCC) 10/05/2018   Status post lumbar spine operative procedure for decompression of spinal cord 06/26/2018   Foot drop, right 05/05/2018   ED (erectile dysfunction) 08/27/2017   BPH associated with nocturia 08/27/2017   Achilles tendon tear, right, subsequent encounter 05/14/2017   Seborrheic keratoses 08/21/2016   Small bowel obstruction (HCC) 07/11/2014   Tendinitis of forearm  04/19/2013   Routine general medical examination at a health care facility 01/24/2012   Lipoma 06/29/2010   TINEA BARBAE 01/22/2010   SMALL BOWEL OBSTRUCTION, HX OF 02/19/2007    PCP: [place], Betty G, MD REFERRING PROVIDER: Philip Aspen, Limmie Patricia, MD  REFERRING DIAG: H81.10 (ICD-10-CM) - Benign paroxysmal positional vertigo, unspecified laterality   THERAPY DIAG:  Dizziness and giddiness  ONSET DATE: 05/08/2023 (MD referral)  Rationale for Evaluation and Treatment: Rehabilitation  SUBJECTIVE:   SUBJECTIVE STATEMENT: Late yesterday morning, I tried to do my typical exercises.  Tried to get up off of the mat, and the room was circling-dizziness, imbalance, equilibrium was not there.  Have never experienced that before.  I have been experiencing a "crook" in my neck when I wake up in the morning over the past few months.  I did try to do some forceful movements to try to get the motion back to the left side.  Have had some pain in the L side of my neck over the past few months.  When I got up today, I did experience the dizziness, but it was less in intensity and duration. Pt accompanied by: self  PERTINENT HISTORY: Saw MD this morning (05/08/2023):  "Yesterday he had the abrupt onset of vertigo while doing stretching exercises at home. He described it as the room spinning around him. Was a little nauseous. He felt like his balance  was off. Episode lasted maybe around 3 minutes although "it felt like a lifetime". No recent URI symptoms. Has never had this before. "  PAIN:  Are you having pain? Yes: NPRS scale: 3/10 Pain location: L neck Pain description: stiff, sometimes "lightning" strike at times Aggravating factors: lying on pillow Relieving factors: unsure  PRECAUTIONS: Fall  RED FLAGS: None   WEIGHT BEARING RESTRICTIONS: No  FALLS: Has patient fallen in last 6 months? No  LIVING ENVIRONMENT: Lives with: lives with their spouse Lives in: House/apartment   PLOF:  Independent and Leisure: very active, enjoy gardening and doing outdoor activities  PATIENT GOALS: To try to get rid of this dizziness.  OBJECTIVE:  Note: Objective measures were completed at Evaluation unless otherwise noted.  DIAGNOSTIC FINDINGS: NA  COGNITION: Overall cognitive status: Within functional limits for tasks assessed   POSTURE:  No Significant postural limitations  Cervical ROM:    Active A/PROM (deg) eval  Flexion 30  Extension 25  Right lateral flexion   Left lateral flexion   Right rotation 48  Left rotation 42  (Blank rows = not tested)   BED MOBILITY:  Independent  TRANSFERS: Assistive device utilized: None  Sit to stand: Complete Independence Stand to sit: Complete Independence  GAIT: Gait pattern: WFL  PATIENT SURVEYS:  FOTO 54; predicted 63  VESTIBULAR ASSESSMENT:  GENERAL OBSERVATION: Guarded, but in no acute distress.   SYMPTOM BEHAVIOR:  Subjective history: Reports dizziness episode yesterday (see above for more detailed hx).  Does have history of hitting his head a couple of months ago, but didn't black out.    Non-Vestibular symptoms:  NA  Type of dizziness: Imbalance (Disequilibrium), Spinning/Vertigo, and Unsteady with head/body turns  Frequency: minutes (yesterday); <1 minute (this morning)  Duration: 2x in the past two days  Aggravating factors: Induced by position change: supine to sit and coming up from R sidelying to sit  Relieving factors: head stationary and slow movements  Progression of symptoms: better  OCULOMOTOR EXAM:  Ocular Alignment: normal  Ocular ROM: No Limitations  Spontaneous Nystagmus: absent  Gaze-Induced Nystagmus: absent  Smooth Pursuits: intact  Saccades: intact  Convergence/Divergence: 1-2 cm   VESTIBULAR - OCULAR REFLEX:   Slow VOR: Normal  VOR Cancellation: Normal  Head-Impulse Test: HIT Right: negative HIT Left: negative  Dynamic Visual Acuity:  NT   POSITIONAL TESTING: Right  Dix-Hallpike: no nystagmus and *almost nystagmus, but no dizziness in R DH*; pt does feel dizzy and imbalance upon sitting from R side to sit EOM, passes within 5-10 seconds (rates 3-4/10) Left Dix-Hallpike: no nystagmus and slight feeling of dizziness upon sitting up Right Roll Test: no nystagmus Left Roll Test: no nystagmus                                                                                                                             TREATMENT DATE: 05/08/2023   Canalith Repositioning:  Epley Right: Number of Reps: 2, Response  to Treatment: symptoms improved, and Comment: Rep 1:  pt with definite nystagmus x 15 sec and then dizziness noted in position 2 and 3; with rep 2:  no nystagmus, very brief dizziness, in position 3 slight dizziness.  PATIENT EDUCATION: Education details: Initial eval results, POC; BPPV and rationale to treat with R Epley maneuver; discussed post-BPPV treatment care Person educated: Patient Education method: Explanation Education comprehension: verbalized understanding  HOME EXERCISE PROGRAM: Not yet initiated  GOALS: Goals reviewed with patient? Yes  SHORT TERM GOALS: = LTGs    LONG TERM GOALS: Target date: 06/06/2023  Pt will be independent with HEP for improved dizziness. Baseline:  Goal status: INITIAL  2.  Pt will rate 0/10 with bed mobility and exercise activities at home. Baseline:  Goal status: INITIAL  3.  Pt will improve FOTO score to at least 63 to demonstrate improved overall functional mobility. Baseline:  Goal status: INITIAL  ASSESSMENT:  CLINICAL IMPRESSION: Patient is a 78 y.o. male who was seen today for physical therapy evaluation and treatment for BPPV.   He saw MD earlier today due to an episode yesterday getting up from his exercise mat, where he experienced intense spinning, dizziness and disequilibrium.  He reports it lasted several minutes.  He noted it again this morning, getting up from his bed, but it was  less intense and less duration.  Pt does report history in the past several months of hitting his head, though not severe, and he reports have a "crook" in his L side of neck, causing him to try forceful movements to the left side.  Oculomotor and VOR testing is [facility].  With positional testing, very slight rotational torsional nystagmus with R Gilberto Better, but pt does not report dizziness.  He does however, note, more intense dizziness upon sitting up from R side.  With attempts again at R [facility], pt does have overt spinning and nystagmus, indicating R posterior canal BPPV.  Treated x 2 with R Epley maneuver, with less symptoms each time.  He will benefit from skilled PT to further address dizziness to fully resolve, for pt to return to full functional mobility and exercise level.  OBJECTIVE IMPAIRMENTS: dizziness, impaired flexibility, and pain.   ACTIVITY LIMITATIONS: sleeping and bed mobility  PARTICIPATION LIMITATIONS: community activity, yard work, and exercise  PERSONAL FACTORS: 1-2 comorbidities: see above  are also affecting patient's functional outcome.   REHAB POTENTIAL: Good  CLINICAL DECISION MAKING: Stable/uncomplicated  EVALUATION COMPLEXITY: Low   PLAN:  PT FREQUENCY: 2x/week  PT DURATION: 4 weeks plus eval visit  PLANNED INTERVENTIONS: 97110-Therapeutic exercises, 97530- Therapeutic activity, [id number]- Neuromuscular re-education, 97535- Self Care, [id number]- Manual therapy, [id number]- Canalith repositioning, and Patient/Family education  PLAN FOR NEXT SESSION: Reassess BPPV and treat as needed; initiate habituation exercises for Brandt-Daroff if appropriate.   Gean Maidens., PT 05/08/2023, 4:20 PM   [facility] at [facility] [address] [place], [address] [place], [place] [address] Phone # [phone] Fax # [phone]

## 2023-05-09 NOTE — Therapy (Signed)
OUTPATIENT PHYSICAL THERAPY VESTIBULAR TREATMENT     Patient Name: Richard Middleton MRN: [id number] DOB:1946-01-22, 78 y.o., male Today's Date: 05/09/2023  END OF SESSION:    Past Medical History:  Diagnosis Date   Abnormal glucose    History of small bowel obstruction    x2   Pneumonia    as a child   Past Surgical History:  Procedure Laterality Date   ACHILLES TENDON SURGERY Right 05/02/2017   Procedure: RIGHT ACHILLES RECONSTRUCTION;  Surgeon: Nadara Mustard, MD;  Location: [facility] OR;  Service: Orthopedics;  Laterality: Right;   COLONOSCOPY  2008   LAPAROSCOPIC SMALL BOWEL RESECTION  3 1/2 years ago, & 10 years ago   x2   LAPAROTOMY N/A 07/13/2014   Procedure: EXPLORATORY LAPAROTOMY lysis of adhesions;  Surgeon: Romie Levee, MD;  Location: [facility] ORS;  Service: General;  Laterality: N/A;   LUMBAR LAMINECTOMY/DECOMPRESSION MICRODISCECTOMY N/A 05/18/2018   Procedure: L4-5 DECOMPRESSION, RIGHT MICRODISCECTOMY;  Surgeon: Eldred Manges, MD;  Location: [facility] OR;  Service: Orthopedics;  Laterality: N/A;   Patient Active Problem List   Diagnosis Date Noted   Atherosclerosis of aorta (HCC) 12/03/2019   Prediabetes 12/03/2019   Partial small bowel obstruction (HCC) 10/05/2018   Status post lumbar spine operative procedure for decompression of spinal cord 06/26/2018   Foot drop, right 05/05/2018   ED (erectile dysfunction) 08/27/2017   BPH associated with nocturia 08/27/2017   Achilles tendon tear, right, subsequent encounter 05/14/2017   Seborrheic keratoses 08/21/2016   Small bowel obstruction (HCC) 07/11/2014   Tendinitis of forearm 04/19/2013   Routine general medical examination at a health care facility 01/24/2012   Lipoma 06/29/2010   TINEA BARBAE 01/22/2010   SMALL BOWEL OBSTRUCTION, HX OF 02/19/2007    PCP: [place], Betty G, MD REFERRING PROVIDER: Philip Aspen, Limmie Patricia, MD  REFERRING DIAG: H81.10 (ICD-10-CM) - Benign paroxysmal positional vertigo, unspecified laterality    THERAPY DIAG:  No diagnosis found.  ONSET DATE: 05/08/2023 (MD referral)  Rationale for Evaluation and Treatment: Rehabilitation  SUBJECTIVE:   SUBJECTIVE STATEMENT: Late yesterday morning, I tried to do my typical exercises.  Tried to get up off of the mat, and the room was circling-dizziness, imbalance, equilibrium was not there.  Have never experienced that before.  I have been experiencing a "crook" in my neck when I wake up in the morning over the past few months.  I did try to do some forceful movements to try to get the motion back to the left side.  Have had some pain in the L side of my neck over the past few months.  When I got up today, I did experience the dizziness, but it was less in intensity and duration. Pt accompanied by: self  PERTINENT HISTORY: Saw MD this morning (05/08/2023):  "Yesterday he had the abrupt onset of vertigo while doing stretching exercises at home. He described it as the room spinning around him. Was a little nauseous. He felt like his balance was off. Episode lasted maybe around 3 minutes although "it felt like a lifetime". No recent URI symptoms. Has never had this before. "  PAIN:  Are you having pain? Yes: NPRS scale: 3/10 Pain location: L neck Pain description: stiff, sometimes "lightning" strike at times Aggravating factors: lying on pillow Relieving factors: unsure  PRECAUTIONS: Fall  RED FLAGS: None   WEIGHT BEARING RESTRICTIONS: No  FALLS: Has patient fallen in last 6 months? No  LIVING ENVIRONMENT: Lives with: lives with their spouse Lives  in: House/apartment   PLOF: Independent and Leisure: very active, enjoy gardening and doing outdoor activities  PATIENT GOALS: To try to get rid of this dizziness.  OBJECTIVE:     TODAY'S TREATMENT: 05/09/23 Activity Comments                           Note: Objective measures were completed at Evaluation unless otherwise noted.  DIAGNOSTIC FINDINGS:  NA  COGNITION: Overall cognitive status: Within functional limits for tasks assessed   POSTURE:  No Significant postural limitations  Cervical ROM:    Active A/PROM (deg) eval  Flexion 30  Extension 25  Right lateral flexion   Left lateral flexion   Right rotation 48  Left rotation 42  (Blank rows = not tested)   BED MOBILITY:  Independent  TRANSFERS: Assistive device utilized: None  Sit to stand: Complete Independence Stand to sit: Complete Independence  GAIT: Gait pattern: WFL  PATIENT SURVEYS:  FOTO 54; predicted 63  VESTIBULAR ASSESSMENT:  GENERAL OBSERVATION: Guarded, but in no acute distress.   SYMPTOM BEHAVIOR:  Subjective history: Reports dizziness episode yesterday (see above for more detailed hx).  Does have history of hitting his head a couple of months ago, but didn't black out.    Non-Vestibular symptoms:  NA  Type of dizziness: Imbalance (Disequilibrium), Spinning/Vertigo, and Unsteady with head/body turns  Frequency: minutes (yesterday); <1 minute (this morning)  Duration: 2x in the past two days  Aggravating factors: Induced by position change: supine to sit and coming up from R sidelying to sit  Relieving factors: head stationary and slow movements  Progression of symptoms: better  OCULOMOTOR EXAM:  Ocular Alignment: normal  Ocular ROM: No Limitations  Spontaneous Nystagmus: absent  Gaze-Induced Nystagmus: absent  Smooth Pursuits: intact  Saccades: intact  Convergence/Divergence: 1-2 cm   VESTIBULAR - OCULAR REFLEX:   Slow VOR: Normal  VOR Cancellation: Normal  Head-Impulse Test: HIT Right: negative HIT Left: negative  Dynamic Visual Acuity:  NT   POSITIONAL TESTING: Right Dix-Hallpike: no nystagmus and *almost nystagmus, but no dizziness in R DH*; pt does feel dizzy and imbalance upon sitting from R side to sit EOM, passes within 5-10 seconds (rates 3-4/10) Left Dix-Hallpike: no nystagmus and slight feeling of dizziness upon sitting  up Right Roll Test: no nystagmus Left Roll Test: no nystagmus                                                                                                                             TREATMENT DATE: 05/08/2023   Canalith Repositioning:  Epley Right: Number of Reps: 2, Response to Treatment: symptoms improved, and Comment: Rep 1:  pt with definite nystagmus x 15 sec and then dizziness noted in position 2 and 3; with rep 2:  no nystagmus, very brief dizziness, in position 3 slight dizziness.  PATIENT EDUCATION: Education details: Initial eval results, POC; BPPV and rationale to treat with  R Epley maneuver; discussed post-BPPV treatment care Person educated: Patient Education method: Explanation Education comprehension: verbalized understanding  HOME EXERCISE PROGRAM: Not yet initiated  GOALS: Goals reviewed with patient? Yes  SHORT TERM GOALS: = LTGs    LONG TERM GOALS: Target date: 06/06/2023  Pt will be independent with HEP for improved dizziness. Baseline:  Goal status: INITIAL  2.  Pt will rate 0/10 with bed mobility and exercise activities at home. Baseline:  Goal status: INITIAL  3.  Pt will improve FOTO score to at least 63 to demonstrate improved overall functional mobility. Baseline:  Goal status: INITIAL  ASSESSMENT:  CLINICAL IMPRESSION: Patient is a 78 y.o. male who was seen today for physical therapy evaluation and treatment for BPPV.   He saw MD earlier today due to an episode yesterday getting up from his exercise mat, where he experienced intense spinning, dizziness and disequilibrium.  He reports it lasted several minutes.  He noted it again this morning, getting up from his bed, but it was less intense and less duration.  Pt does report history in the past several months of hitting his head, though not severe, and he reports have a "crook" in his L side of neck, causing him to try forceful movements to the left side.  Oculomotor and VOR testing is [facility].   With positional testing, very slight rotational torsional nystagmus with R Gilberto Better, but pt does not report dizziness.  He does however, note, more intense dizziness upon sitting up from R side.  With attempts again at R [facility], pt does have overt spinning and nystagmus, indicating R posterior canal BPPV.  Treated x 2 with R Epley maneuver, with less symptoms each time.  He will benefit from skilled PT to further address dizziness to fully resolve, for pt to return to full functional mobility and exercise level.  OBJECTIVE IMPAIRMENTS: dizziness, impaired flexibility, and pain.   ACTIVITY LIMITATIONS: sleeping and bed mobility  PARTICIPATION LIMITATIONS: community activity, yard work, and exercise  PERSONAL FACTORS: 1-2 comorbidities: see above  are also affecting patient's functional outcome.   REHAB POTENTIAL: Good  CLINICAL DECISION MAKING: Stable/uncomplicated  EVALUATION COMPLEXITY: Low   PLAN:  PT FREQUENCY: 2x/week  PT DURATION: 4 weeks plus eval visit  PLANNED INTERVENTIONS: 97110-Therapeutic exercises, 97530- Therapeutic activity, [id number]- Neuromuscular re-education, 97535- Self Care, [id number]- Manual therapy, [id number]- Canalith repositioning, and Patient/Family education  PLAN FOR NEXT SESSION: Reassess BPPV and treat as needed; initiate habituation exercises for Brandt-Daroff if appropriate.   Isabell Jarvis, PT 05/09/2023, 9:38 AM   [facility] at [facility] [address] [place], [address] [place], [place] [address] Phone # [phone] Fax # [phone]

## 2023-05-13 ENCOUNTER — Encounter: Payer: Self-pay | Admitting: Physical Therapy

## 2023-05-13 ENCOUNTER — Ambulatory Visit: Payer: Medicare Other | Admitting: Physical Therapy

## 2023-05-13 DIAGNOSIS — R42 Dizziness and giddiness: Secondary | ICD-10-CM | POA: Diagnosis not present

## 2023-05-14 ENCOUNTER — Ambulatory Visit (INDEPENDENT_AMBULATORY_CARE_PROVIDER_SITE_OTHER): Payer: Medicare Other

## 2023-05-14 VITALS — Ht 73.0 in | Wt 170.0 lb

## 2023-05-14 DIAGNOSIS — Z0000 Encounter for general adult medical examination without abnormal findings: Secondary | ICD-10-CM

## 2023-05-14 NOTE — Patient Instructions (Addendum)
Richard Middleton , Thank you for taking time to come for your Medicare Wellness Visit. I appreciate your ongoing commitment to your health goals. Please review the following plan we discussed and let me know if I can assist you in the future.   Referrals/Orders/Follow-Ups/Clinician Recommendations: Continue to exercise.  Aim for 30 minutes of exercise or brisk walking, 6-8 glasses of water, and 5 servings of fruits and vegetables each day.   This is a list of the screening recommended for you and due dates:  Health Maintenance  Topic Date Due   COVID-19 Vaccine (5 - 2024-25 season) 02/09/2023   Medicare Annual Wellness Visit  05/13/2024   Pneumonia Vaccine  Completed   Flu Shot  Completed   Hepatitis C Screening  Completed   HPV Vaccine  Aged Out   DTaP/Tdap/Td vaccine  Discontinued   Colon Cancer Screening  Discontinued   Zoster (Shingles) Vaccine  Discontinued    Advanced directives: (Copy Requested) Please bring a copy of your health care power of attorney and living will to the office to be added to your chart at your convenience.  Next Medicare Annual Wellness Visit scheduled for next year: Yes

## 2023-05-14 NOTE — Progress Notes (Cosign Needed Addendum)
Subjective:   Richard Middleton is a 78 y.o. male who presents for Medicare Annual/Subsequent preventive examination.  Visit Complete: Virtual I connected with  Bertrum Sol on 05/14/23 by a audio enabled telemedicine application and verified that I am speaking with the correct person using two identifiers.  Patient Location: Home  Provider Location: Office/Clinic  I discussed the limitations of evaluation and management by telemedicine. The patient expressed understanding and agreed to proceed.  Vital Signs: Because this visit was a virtual/telehealth visit, some criteria may be missing or patient reported. Any vitals not documented were not able to be obtained and vitals that have been documented are patient reported.  Cardiac Risk Factors include: advanced age (>72men, >81 women);male gender     Objective:    Today's Vitals   05/14/23 1127  Weight: 170 lb (77.1 kg)  Height: 6\' 1"  (1.854 m)   Body mass index is 22.43 kg/m.     05/14/2023   11:43 AM 05/08/2023    2:50 PM 04/30/2022   11:41 AM 04/26/2021   11:30 AM 04/25/2020   11:28 AM 04/06/2019    3:04 PM 10/05/2018    2:22 PM  Advanced Directives  Does Patient Have a Medical Advance Directive? Yes Yes No No Yes Yes No  Type of Estate agent of [place];Living will    Healthcare Power of [place];Living will    Does patient want to make changes to medical advance directive?     No - Patient declined No - Patient declined   Copy of Healthcare Power of Attorney in Chart? No - copy requested    No - copy requested    Would patient like information on creating a medical advance directive?   No - Patient declined No - Patient declined   No - Patient declined    Current Medications (verified) Outpatient Encounter Medications as of 05/14/2023  Medication Sig   lovastatin (MEVACOR) 10 MG tablet Take 1 tablet (10 mg total) by mouth at bedtime.   meclizine (ANTIVERT) 12.5 MG tablet Take 1 tablet (12.5 mg total) by  mouth 3 (three) times daily as needed for dizziness.   senna-docusate (SENOKOT-S) 8.6-50 MG tablet Take 1 tablet by mouth daily.   VITAMIN D PO Take 500 mg by mouth daily.    [DISCONTINUED] CHILDRENS ASPIRIN PO Take by mouth. Taking half children low dose aspirin.   No facility-administered encounter medications on file as of 05/14/2023.    Allergies (verified) Patient has no known allergies.   History: Past Medical History:  Diagnosis Date   Abnormal glucose    History of small bowel obstruction    x2   Pneumonia    as a child   Past Surgical History:  Procedure Laterality Date   ACHILLES TENDON SURGERY Right 05/02/2017   Procedure: RIGHT ACHILLES RECONSTRUCTION;  Surgeon: Nadara Mustard, MD;  Location: [facility] OR;  Service: Orthopedics;  Laterality: Right;   COLONOSCOPY  2008   LAPAROSCOPIC SMALL BOWEL RESECTION  3 1/2 years ago, & 10 years ago   x2   LAPAROTOMY N/A 07/13/2014   Procedure: EXPLORATORY LAPAROTOMY lysis of adhesions;  Surgeon: Romie Levee, MD;  Location: [facility] ORS;  Service: General;  Laterality: N/A;   LUMBAR LAMINECTOMY/DECOMPRESSION MICRODISCECTOMY N/A 05/18/2018   Procedure: L4-5 DECOMPRESSION, RIGHT MICRODISCECTOMY;  Surgeon: Eldred Manges, MD;  Location: [facility] OR;  Service: Orthopedics;  Laterality: N/A;   Family History  Problem Relation Age of Onset   Arthritis Mother  Pulmonary embolism Father    Cancer Neg Hx    Diabetes Neg Hx    Colon cancer Neg Hx    Social History   Socioeconomic History   Marital status: Married    Spouse name: Not on file   Number of children: 1   Years of education: 6   Highest education level: Associate degree: occupational, Scientist, product/process development, or vocational program  Occupational History   Occupation: retired  Tobacco Use   Smoking status: Former    Current packs/day: 0.00    Average packs/day: 4.0 packs/day for 4.0 years (16.0 ttl pk-yrs)    Types: Cigarettes    Start date: 04/15/1966    Quit date: 04/15/1970    Years since  quitting: 53.1   Smokeless tobacco: Never  Vaping Use   Vaping status: Never Used  Substance and Sexual Activity   Alcohol use: Yes    Alcohol/week: 1.0 standard drink of alcohol    Types: 1 Glasses of wine per week    Comment: 2-3 times per month    Drug use: No   Sexual activity: Yes  Other Topics Concern   Not on file  Social History Narrative   Not on file   Social Drivers of Health   Financial Resource Strain: Low Risk  (05/14/2023)   Overall Financial Resource Strain (CARDIA)    Difficulty of Paying Living Expenses: Not hard at all  Food Insecurity: No Food Insecurity (05/14/2023)   Hunger Vital Sign    Worried About Running Out of Food in the Last Year: Never true    Ran Out of Food in the Last Year: Never true  Transportation Needs: No Transportation Needs (05/14/2023)   PRAPARE - Administrator, Civil Service (Medical): No    Lack of Transportation (Non-Medical): No  Physical Activity: Sufficiently Active (05/14/2023)   Exercise Vital Sign    Days of Exercise per Week: 7 days    Minutes of Exercise per Session: 60 min  Stress: No Stress Concern Present (05/14/2023)   [employer] of Occupational Health - Occupational Stress Questionnaire    Feeling of Stress : Not at all  Social Connections: Socially Integrated (05/14/2023)   Social Connection and Isolation Panel [NHANES]    Frequency of Communication with Friends and Family: More than three times a week    Frequency of Social Gatherings with Friends and Family: More than three times a week    Attends Religious Services: More than 4 times per year    Active Member of [employer] or Organizations: Yes    Attends Engineer, structural: More than 4 times per year    Marital Status: Married    Tobacco Counseling Counseling given: Not Answered   Clinical Intake:  Pre-visit preparation completed: Yes  Pain : No/denies pain     BMI - recorded: 22.43 Nutritional Status: BMI of 19-24   Normal Nutritional Risks: None Diabetes: No  How often do you need to have someone help you when you read instructions, pamphlets, or other written materials from your doctor or pharmacy?: 1 - Never  Interpreter Needed?: No  Information entered by :: Hassell Halim, CMA   Activities of Daily Living    05/14/2023   11:34 AM  In your present state of health, do you have any difficulty performing the following activities:  Hearing? 0  Vision? 0  Difficulty concentrating or making decisions? 0  Walking or climbing stairs? 0  Dressing or bathing? 0  Doing errands, shopping? 0  Preparing Food and eating ? N  Using the Toilet? N  In the past six months, have you accidently leaked urine? N  Do you have problems with loss of bowel control? N  Managing your Medications? N  Managing your Finances? N  Housekeeping or managing your Housekeeping? N    Patient Care Team: [place], Betty G, MD as PCP - General (Family Medicine)  Indicate any recent Medical Services you may have received from other than [facility] providers in the past year (date may be approximate).     Assessment:   This is a routine wellness examination for Richard Middleton.  Hearing/Vision screen Hearing Screening - Comments:: Denies hearing difficulties   Vision Screening - Comments:: Wears reading glasses - up to date with routine eye exams with  Dr Joice Lofts   Goals Addressed               This Visit's Progress     Patient Stated (pt-stated)        Patient stated to remain active.       Depression Screen    05/14/2023   11:38 AM 05/08/2023   10:37 AM 10/07/2022   10:06 AM 07/01/2022    2:36 PM 04/30/2022   11:39 AM 03/22/2022   10:26 AM 12/25/2021   10:10 AM  PHQ 2/9 Scores  PHQ - 2 Score 0 0 0 0 0 0 0  PHQ- 9 Score    2       Fall Risk    05/14/2023   11:53 AM 05/14/2023   11:40 AM 05/08/2023   10:37 AM 04/30/2022   11:41 AM 12/25/2021   10:10 AM  Fall Risk   Falls in the past year? 0 0 0 0 0  Number falls in past  yr: 0 0 0 0 0  Injury with Fall? 0 0 0 0 0  Risk for fall due to : No Fall Risks No Fall Risks  No Fall Risks Other (Comment)  Follow up Falls prevention discussed Falls prevention discussed Falls evaluation completed Falls prevention discussed Falls evaluation completed    MEDICARE RISK AT HOME: Medicare Risk at Home Any stairs in or around the home?: Yes If so, are there any without handrails?: Yes Home free of loose throw rugs in walkways, pet beds, electrical cords, etc?: Yes Adequate lighting in your home to reduce risk of falls?: Yes Life alert?: No Use of a cane, walker or w/c?: No Grab bars in the bathroom?: Yes Shower chair or bench in shower?: No Elevated toilet seat or a handicapped toilet?: Yes  TIMED UP AND GO:  Was the test performed?  No    Cognitive Function:    12/22/2015   11:37 AM  MMSE - Mini Mental State Exam  Not completed: --        05/14/2023   11:41 AM 04/30/2022   11:42 AM 04/26/2021   11:27 AM 04/06/2019    3:16 PM  6CIT Screen  What Year? 0 points 0 points 0 points   What month? 0 points 0 points 0 points 0 points  What time? 0 points 0 points 0 points 0 points  Count back from 20 0 points 0 points 0 points 0 points  Months in reverse 0 points 0 points 0 points 0 points  Repeat phrase 0 points 0 points 0 points 0 points  Total Score 0 points 0 points 0 points     Immunizations Immunization History  Administered Date(s) Administered   Fluad Quad(high Dose  65+) 02/10/2020, 12/22/2020   Fluad Trivalent(High Dose 65+) 12/27/2022   Influenza Split 01/24/2012   Influenza Whole 01/12/2007, 01/05/2008, 02/01/2009, 01/09/2010   Influenza, High Dose Seasonal PF 12/22/2015, 01/19/2018   Influenza,inj,Quad PF,6+ Mos 01/11/2013   Influenza-Unspecified 01/19/2018, 01/20/2019   PFIZER(Purple Top)SARS-COV-2 Vaccination 05/20/2019, 06/10/2019, 01/16/2021   Pneumococcal Conjugate-13 08/18/2015   Pneumococcal Polysaccharide-23 01/24/2012, 08/27/2017,  02/10/2020   Td 08/01/2006   Unspecified SARS-COV-2 Vaccination 12/15/2022   Zoster Recombinant(Shingrix) 01/20/2019, 01/20/2019   Zoster, Live 07/21/2007      Flu Vaccine status: Up to date  Pneumococcal vaccine status: Up to date  Covid-19 vaccine status: Completed vaccines - received 4th vaccine at [facility] pharmacy in 12/2022    Screening Tests Health Maintenance  Topic Date Due   COVID-19 Vaccine (5 - 2024-25 season) 02/09/2023   Medicare Annual Wellness (AWV)  05/13/2024   Pneumonia Vaccine 39+ Years old  Completed   INFLUENZA VACCINE  Completed   Hepatitis C Screening  Completed   HPV VACCINES  Aged Out   DTaP/Tdap/Td  Discontinued   Colonoscopy  Discontinued   Zoster Vaccines- Shingrix  Discontinued    Health Maintenance  Health Maintenance Due  Topic Date Due   COVID-19 Vaccine (5 - 2024-25 season) 02/09/2023     Additional Screening:  Hepatitis C Screening: Does qualify Completed 01/23/18  Vision Screening: Recommended annual ophthalmology exams for early detection of glaucoma and other disorders of the eye. Is the patient up to date with their annual eye exam?  Yes  Who is the provider or what is the name of the office in which the patient attends annual eye exams? Dr Joice Lofts If pt is not established with a provider, would they like to be referred to a provider to establish care? No .   Dental Screening: Recommended annual dental exams for proper oral hygiene  Community Resource Referral / Chronic Care Management: CRR required this visit?  No   CCM required this visit?  No     Plan:     I have personally reviewed and noted the following in the patient's chart:   Medical and social history Use of alcohol, tobacco or illicit drugs  Current medications and supplements including opioid prescriptions. Patient is not currently taking opioid prescriptions. Functional ability and status Nutritional status Physical activity Advanced directives List  of other physicians Hospitalizations, surgeries, and ER visits in previous 12 months Vitals Screenings to include cognitive, depression, and falls Referrals and appointments  In addition, I have reviewed and discussed with patient certain preventive protocols, quality metrics, and best practice recommendations. A written personalized care plan for preventive services as well as general preventive health recommendations were provided to patient.     Tillie Rung, LPN   [id number]/[id number]   After Visit Summary: (MyChart) Due to this being a telephonic visit, the after visit summary with patients personalized plan was offered to patient via MyChart   Nurse Notes: none

## 2023-05-16 NOTE — Progress Notes (Signed)
ACUTE VISIT No chief complaint on file.  HPI: [name] is a 78 y.o. male with a PMHx significant for aortic atherosclerosis, prediabetes, and BPH, who is here today complaining of left lower abdominal pain ***.   Review of Systems See other pertinent positives and negatives in HPI.  Current Outpatient Medications on File Prior to Visit  Medication Sig Dispense Refill   lovastatin (MEVACOR) 10 MG tablet Take 1 tablet (10 mg total) by mouth at bedtime. 90 tablet 3   meclizine (ANTIVERT) 12.5 MG tablet Take 1 tablet (12.5 mg total) by mouth 3 (three) times daily as needed for dizziness. 30 tablet 0   senna-docusate (SENOKOT-S) 8.6-50 MG tablet Take 1 tablet by mouth daily. 30 tablet 0   VITAMIN D PO Take 500 mg by mouth daily.      No current facility-administered medications on file prior to visit.    Past Medical History:  Diagnosis Date   Abnormal glucose    History of small bowel obstruction    x2   Pneumonia    as a child   No Known Allergies  Social History   Socioeconomic History   Marital status: Married    Spouse name: Not on file   Number of children: 1   Years of education: 6   Highest education level: Associate degree: occupational, Scientist, product/process development, or vocational program  Occupational History   Occupation: retired  Tobacco Use   Smoking status: Former    Current packs/day: 0.00    Average packs/day: 4.0 packs/day for 4.0 years (16.0 ttl pk-yrs)    Types: Cigarettes    Start date: 04/15/1966    Quit date: 04/15/1970    Years since quitting: 53.1   Smokeless tobacco: Never  Vaping Use   Vaping status: Never Used  Substance and Sexual Activity   Alcohol use: Yes    Alcohol/week: 1.0 standard drink of alcohol    Types: 1 Glasses of wine per week    Comment: 2-3 times per month    Drug use: No   Sexual activity: Yes  Other Topics Concern   Not on file  Social History Narrative   Not on file   Social Drivers of Health   Financial Resource Strain:  Low Risk  (05/14/2023)   Overall Financial Resource Strain (CARDIA)    Difficulty of Paying Living Expenses: Not hard at all  Food Insecurity: No Food Insecurity (05/14/2023)   Hunger Vital Sign    Worried About Running Out of Food in the Last Year: Never true    Ran Out of Food in the Last Year: Never true  Transportation Needs: No Transportation Needs (05/14/2023)   PRAPARE - Administrator, Civil Service (Medical): No    Lack of Transportation (Non-Medical): No  Physical Activity: Sufficiently Active (05/14/2023)   Exercise Vital Sign    Days of Exercise per Week: 7 days    Minutes of Exercise per Session: 60 min  Stress: No Stress Concern Present (05/14/2023)   [employer] of Occupational Health - Occupational Stress Questionnaire    Feeling of Stress : Not at all  Social Connections: Socially Integrated (05/14/2023)   Social Connection and Isolation Panel [NHANES]    Frequency of Communication with Friends and Family: More than three times a week    Frequency of Social Gatherings with Friends and Family: More than three times a week    Attends Religious Services: More than 4 times per year    Active Member  of Clubs or Organizations: Yes    Attends Banker Meetings: More than 4 times per year    Marital Status: Married    There were no vitals filed for this visit. There is no height or weight on file to calculate BMI.  Physical Exam  ASSESSMENT AND PLAN:  [name] was seen today for left lower abdominal pain. ***  There are no diagnoses linked to this encounter.  No follow-ups on file.  I, Rolla Etienne Wierda, acting as a scribe for Richard Bastin [place], MD., have documented all relevant documentation on the behalf of Richard Jimmerson [place], MD, as directed by  Richard Escue [place], MD while in the presence of Richard Newton [place], MD.   I, Richard Vandervelden [place], MD, have reviewed all documentation for this visit. The documentation on 05/16/23 for the exam, diagnosis, procedures, and orders  are all accurate and complete.  Richard Bahe G. [place], MD  [facility]. [place] office.  Discharge Instructions   None

## 2023-05-19 ENCOUNTER — Ambulatory Visit (INDEPENDENT_AMBULATORY_CARE_PROVIDER_SITE_OTHER): Payer: Medicare Other | Admitting: Family Medicine

## 2023-05-19 ENCOUNTER — Encounter: Payer: Self-pay | Admitting: Family Medicine

## 2023-05-19 VITALS — BP 136/80 | HR 76 | Temp 97.7°F | Resp 16 | Ht 73.0 in | Wt 168.0 lb

## 2023-05-19 DIAGNOSIS — Z1211 Encounter for screening for malignant neoplasm of colon: Secondary | ICD-10-CM | POA: Diagnosis not present

## 2023-05-19 DIAGNOSIS — R1032 Left lower quadrant pain: Secondary | ICD-10-CM | POA: Diagnosis not present

## 2023-05-19 DIAGNOSIS — D369 Benign neoplasm, unspecified site: Secondary | ICD-10-CM | POA: Diagnosis not present

## 2023-05-19 LAB — CBC
HCT: 44.6 % (ref 39.0–52.0)
Hemoglobin: 14.5 g/dL (ref 13.0–17.0)
MCHC: 32.5 g/dL (ref 30.0–36.0)
MCV: 85.2 fL (ref 78.0–100.0)
Platelets: 183 10*3/uL (ref 150.0–400.0)
RBC: 5.23 Mil/uL (ref 4.22–5.81)
RDW: 13.6 % (ref 11.5–15.5)
WBC: 4.2 10*3/uL (ref 4.0–10.5)

## 2023-05-19 LAB — C-REACTIVE PROTEIN: CRP: <1 mg/dL (ref 0.5–20.0)

## 2023-05-19 NOTE — Patient Instructions (Addendum)
A few things to remember from today's visit:  Left lower quadrant abdominal pain - Plan: CBC, C-reactive protein  Adenomatous polyp - Plan: Ambulatory referral to Gastroenterology  Colon cancer screening - Plan: Ambulatory referral to Gastroenterology  ? Diverticulosis,mild diverticulitis,muscle related are in the differential. Because it is getting better I do not think imaging is needed. Monitor for worsening pain, fever, urinary symptoms, or changes in bowel habits.  If you need refills for medications you take chronically, please call your pharmacy. Do not use My Chart to request refills or for acute issues that need immediate attention. If you send a my chart message, it may take a few days to be addressed, specially if I am not in the office.  Please be sure medication list is accurate. If a new problem present, please set up appointment sooner than planned today.

## 2023-06-08 ENCOUNTER — Other Ambulatory Visit: Payer: Self-pay | Admitting: Family Medicine

## 2023-06-08 DIAGNOSIS — I700 Atherosclerosis of aorta: Secondary | ICD-10-CM

## 2023-06-09 ENCOUNTER — Ambulatory Visit: Payer: Medicare Other | Admitting: Family Medicine

## 2023-06-20 ENCOUNTER — Encounter: Payer: Self-pay | Admitting: Gastroenterology

## 2023-07-03 ENCOUNTER — Telehealth: Payer: Self-pay | Admitting: *Deleted

## 2023-07-03 NOTE — Telephone Encounter (Signed)
 Copied from CRM [id number]. Topic: Clinical - Medical Advice >> Jul 03, 2023  2:15 PM Jon Gills C wrote: Reason for CRM: Patient would like to know if he needs to take the measels shot , would like for some one to get back in touch with him regarding this

## 2023-07-04 NOTE — Telephone Encounter (Signed)
 If he had all of his childhood vaccinations, which include MMR x 2 and he is not planning on traveling, I do not think it is necessary at this time. Thanks, BJ

## 2023-07-04 NOTE — Telephone Encounter (Signed)
I called and spoke with patient. He is aware of message below & verbalized understanding.  ?

## 2023-07-11 ENCOUNTER — Ambulatory Visit

## 2023-07-11 ENCOUNTER — Encounter: Payer: Self-pay | Admitting: Family Medicine

## 2023-07-11 ENCOUNTER — Ambulatory Visit: Admitting: Family Medicine

## 2023-07-11 VITALS — BP 136/82 | HR 67 | Temp 98.6°F | Ht 73.0 in | Wt 165.4 lb

## 2023-07-11 DIAGNOSIS — M25562 Pain in left knee: Secondary | ICD-10-CM | POA: Diagnosis not present

## 2023-07-11 DIAGNOSIS — M25462 Effusion, left knee: Secondary | ICD-10-CM | POA: Diagnosis not present

## 2023-07-11 DIAGNOSIS — M1712 Unilateral primary osteoarthritis, left knee: Secondary | ICD-10-CM | POA: Diagnosis not present

## 2023-07-11 NOTE — Progress Notes (Signed)
   Acute Office Visit  Subjective:     Patient ID: Richard Middleton, male    DOB: [birth year], 78 y.o.   MRN: [id number]  Chief Complaint  Patient presents with   Acute Visit    Left knee pain, intermittent, ongoing for a few weeks. Does a lot of walking, works in his garden a lot, localized pain.    HPI Patient is in today for left knee pain that has been ongoing for the last few weeks. States that has worsened over the last week. Has been using Tiger balm, compression sleeve.  Has not taken anything orally to help treat. He is very active, walks about 30 to 35 miles per week, is working in his garden often, plays pickle ball. Reports that pain is worse with activity. States that there is some swelling after activity. Denies known injury. Denies erythema, heat, bruising, numbness, tingling, loss of strength in the extremity, other concerns. Medical history as outlined below.   ROS Per HPI      Objective:    BP 136/82 (BP Location: Left Arm, Patient Position: Sitting)   Pulse 67   Temp 98.6 F (37 C) (Temporal)   Ht 6\' 1"  (1.854 m)   Wt 165 lb 6.4 oz (75 kg)   SpO2 96%   BMI 21.82 kg/m    Physical Exam Vitals and nursing note reviewed.  Constitutional:      General: He is not in acute distress.    Appearance: Normal appearance.  HENT:     Head: Normocephalic and atraumatic.  Eyes:     Extraocular Movements: Extraocular movements intact.  Cardiovascular:     Rate and Rhythm: Normal rate.     Pulses: Normal pulses.  Pulmonary:     Effort: Pulmonary effort is normal.  Musculoskeletal:        General: Swelling (superior, lateral aspect of L knee. No heat, erythema, bruising, obvious deformity) present. Normal range of motion.     Cervical back: Normal range of motion.  Skin:    General: Skin is warm and dry.  Neurological:     General: No focal deficit present.     Mental Status: He is alert and oriented to person, place, and time.  Psychiatric:        Mood and  Affect: Mood normal.        Behavior: Behavior normal.    No results found for any visits on 07/11/23.      Assessment & Plan:   Acute pain of left knee -     DG Knee 1-2 Views Left; Future   Continue supportive care at home.  As long as x-rays do not show any fracture, we will proceed with physical therapy. If there are any abnormalities with the x-rays, we will proceed with an orthopedic referral.  No orders of the defined types were placed in this encounter.   Return if symptoms worsen or fail to improve.  Sherald Barge, FNP

## 2023-07-14 NOTE — Patient Instructions (Addendum)
 We are getting an xray today. We will be in contact with any abnormal results that require further attention.  Continue supportive care at home.   As long as x-rays do not show any fracture, we will proceed with physical therapy.  If there are any abnormalities with the x-rays, we will proceed with an orthopedic referral.

## 2023-07-22 ENCOUNTER — Ambulatory Visit: Attending: Internal Medicine | Admitting: Physical Therapy

## 2023-07-22 ENCOUNTER — Encounter: Payer: Self-pay | Admitting: Physical Therapy

## 2023-07-22 ENCOUNTER — Other Ambulatory Visit: Payer: Self-pay

## 2023-07-22 DIAGNOSIS — M6281 Muscle weakness (generalized): Secondary | ICD-10-CM | POA: Diagnosis not present

## 2023-07-22 DIAGNOSIS — R42 Dizziness and giddiness: Secondary | ICD-10-CM | POA: Diagnosis not present

## 2023-07-22 DIAGNOSIS — M25562 Pain in left knee: Secondary | ICD-10-CM | POA: Diagnosis not present

## 2023-07-22 NOTE — Therapy (Signed)
 OUTPATIENT PHYSICAL THERAPY LOWER EXTREMITY EVALUATION  Patient Name: Richard Middleton MRN: [id number] DOB:Aug 29, 1945, 78 y.o., male Today's Date: 07/22/2023   PT End of Session - 07/22/23 1421     Visit Number 1    Number of Visits --   1-2x/week   Date for PT Re-Evaluation 09/16/23    Authorization Type [employer] Medicare - LEFS    Progress Note Due on Visit 10    PT Start Time 0130    PT Stop Time 0211    PT Time Calculation (min) 41 min             Past Medical History:  Diagnosis Date   Abnormal glucose    History of small bowel obstruction    x2   Pneumonia    as a child   Past Surgical History:  Procedure Laterality Date   ACHILLES TENDON SURGERY Right 05/02/2017   Procedure: RIGHT ACHILLES RECONSTRUCTION;  Surgeon: Nadara Mustard, MD;  Location: [facility] OR;  Service: Orthopedics;  Laterality: Right;   COLONOSCOPY  2008   LAPAROSCOPIC SMALL BOWEL RESECTION  3 1/2 years ago, & 10 years ago   x2   LAPAROTOMY N/A 07/13/2014   Procedure: EXPLORATORY LAPAROTOMY lysis of adhesions;  Surgeon: Romie Levee, MD;  Location: [facility] OR[facility];  Service: General;  Laterality: N/A;   LUMBAR LAMINECTOMY/DECOMPRESSION MICRODISCECTOMY N/A 05/18/2018   Procedure: L4-5 DECOMPRESSION, RIGHT MICRODISCECTOMY;  Surgeon: Eldred Manges, MD;  Location: [facility] OR;  Service: Orthopedics;  Laterality: N/A;   Patient Active Problem List   Diagnosis Date Noted   Atherosclerosis of aorta (HCC) 12/03/2019   Prediabetes 12/03/2019   Partial small bowel obstruction (HCC) 10/05/2018   Status post lumbar spine operative procedure for decompression of spinal cord 06/26/2018   Foot drop, right 05/05/2018   ED (erectile dysfunction) 08/27/2017   BPH associated with nocturia 08/27/2017   Achilles tendon tear, right, subsequent encounter 05/14/2017   Seborrheic keratoses 08/21/2016   Small bowel obstruction (HCC) 07/11/2014   Tendinitis of forearm 04/19/2013   Routine general medical examination at a health care facility  01/24/2012   Lipoma 06/29/2010   TINEA BARBAE 01/22/2010   SMALL BOWEL OBSTRUCTION, HX OF 02/19/2007    PCP: [place], Betty G, MD  REFERRING PROVIDER: Sherald Barge, *  THERAPY DIAG:  Left knee pain, unspecified chronicity - Plan: PT plan of care cert/re-cert  Muscle weakness - Plan: PT plan of care cert/re-cert  REFERRING DIAG:  Acute pain of left knee [M25.562]   Rationale for Evaluation and Treatment:  Rehabilitation  SUBJECTIVE:  PERTINENT PAST HISTORY:  R foot drop, history of lumbar decompression      PRECAUTIONS: None  WEIGHT BEARING RESTRICTIONS No  FALLS:  Has patient fallen in last 6 months? No, Number of falls: 0  MOI/History of condition:  Onset date: 3 weeks ago  SUBJECTIVE STATEMENT  Richard Middleton is a 78 y.o. male who presents to clinic with chief complaint of L knee pain which started a few weeks ago.  The first sxs occurred in he posterior knee and are now more lateral.  He is very active and walks regularly with his wife.  The pain is variable.  No recent injury or truama.  He believes there is some slight swelling.  No abnormal clicking or popping.  Pain:  Are you having pain? Yes Pain location: L knee around fibular head NPRS scale:  0/10 to 8/10 Aggravating factors: slow build after walking or being active Relieving factors: rest  Pain description: dull and aching Stage: Subacute 24 hour pattern: worse with activity   Occupation: na  Assistive Device: na  Hand Dominance: na  Patient Goals/Specific Activities: reduce pain, be able to walk normally    OBJECTIVE:   DIAGNOSTIC FINDINGS:   IMPRESSION: 1. Mild degenerative spurring in the medial and patellofemoral compartments. 2. Small knee effusion in the suprapatellar bursa.  SENSATION: Light touch: Appears intact  PALPATION: No current TTP, pt reports pain at lateral joint line and possibly L lateral HS  MUSCLE LENGTH: Hamstrings: Right no restriction; Left no  restriction Ely's test: Right subtle restriction; Left subtle restriction  LE MMT:  MMT Right (Eval) Left (Eval)  Hip flexion (L2, L3) 5 4+  Knee extension (L3) 4+ 4+  Knee flexion 4+ 4+  Hip abduction 4+ 4+  Hip extension    Hip external rotation    Hip internal rotation    Hip adduction    Ankle dorsiflexion (L4)    Ankle plantarflexion (S1)  10x partial ROM S/L heel raise with pain  Ankle inversion    Ankle eversion    Great Toe ext (L5)    Grossly     (Blank rows = not tested, score listed is out of 5 possible points.  N = WNL, D = diminished, C = clear for gross weakness with myotome testing, * = concordant pain with testing)  LE ROM:  ROM Right (Eval) Left (Eval)  Hip flexion    Hip extension    Hip abduction    Hip adduction    Hip internal rotation    Hip external rotation    Knee extension n n  Knee flexion n n  Ankle dorsiflexion    Ankle plantarflexion    Ankle inversion    Ankle eversion     (Blank rows = not tested, N = WNL, * = concordant pain with testing)  Functional Tests  Eval    SLS balance >10'' ea with more difficulty on R                                                          PATIENT SURVEYS:  LEFS: 71/80   TODAY'S TREATMENT: Therapeutic Exercise: Creating, reviewing, and completing below HEP   PATIENT EDUCATION ([place]/[facility]):  POC, diagnosis, prognosis, HEP, and outcome measures.  Pt educated via explanation, demonstration, and handout (HEP).  Pt confirms understanding verbally.   HOME EXERCISE PROGRAM: Access Code: [email or web address] URL: [email or web address] Date: 07/22/2023 Prepared by: Alphonzo Severance  Exercises - Standing Bilateral Heel Raise on Step  - 1 x daily - 7 x weekly - 3 sets - 10-15 reps - Gastroc Stretch on Wall  - 2 x daily - 7 x weekly - 1 sets - 3 reps - 45 seconds hold - Gastroc Stretch with Foot at Wall  - 2 x daily - 7 x weekly - 3 reps - 45 second hold  Treatment priorities   Eval         L G/S complex                                          ASSESSMENT:  CLINICAL IMPRESSION: Richard Middleton is a 78 y.o. male who presents  to clinic with signs and sxs consistent with L lateral/posterior knee pain which started about 3 weeks ago with no trauma.  Appears most consistent with lateral gastroc tendinopathy.  Concordant pain with g/s strech and heel raise.  Pt will benefit form skilled PT to address relevant deficits and improve comfort with recreation activities such as walking and gardening.  Recommended HEP and to get fitted for new walking shoes at [employer].  OBJECTIVE IMPAIRMENTS: Pain, L G/S strength  ACTIVITY LIMITATIONS: squatting, walking, tennis, gardening  PERSONAL FACTORS: See medical history and pertinent history   REHAB POTENTIAL: Good  CLINICAL DECISION MAKING: Stable/uncomplicated  EVALUATION COMPLEXITY: Low   GOALS:   SHORT TERM GOALS: Target date: 08/19/2023   Jafeth will be >75% HEP compliant to improve carryover between sessions and facilitate independent management of condition  Evaluation: ongoing Goal status: INITIAL   LONG TERM GOALS: Target date: 09/16/2023   Richard Middleton will self report >/= 50% decrease in pain from evaluation to improve function in daily tasks  Evaluation/Baseline: 8/10 max pain Goal status: INITIAL   2.  Richard Middleton will be able to walk for 4 miles, not limited by pain   Evaluation/Baseline: up to 8/10 pain Goal status: INITIAL   3.  Richard Middleton will show a >/= 4 pt improvement in LEFS score (MCID is ~11% or 9 pts) as a proxy for functional improvement   Evaluation/Baseline: 71/80 pts Goal status: INITIAL   4.  Richard Middleton will report confidence in self management of condition at time of discharge with advanced HEP  Evaluation/Baseline: unable to self manage Goal status: INITIAL  5. Richard Middleton will be able to perform 10x L SL heel raises, not limited by pain  Evaluation/Baseline: partial range, with pain Goal status:  INITIAL    PLAN: PT FREQUENCY: 1-2x/week  PT DURATION: 8 weeks  PLANNED INTERVENTIONS:  [id number]- PT Re-evaluation, 97110-Therapeutic exercises, 97530- Therapeutic activity, 97112- Neuromuscular re-education, 97535- Self Care, [id number]- Manual therapy, [id number]- Gait training, [id number]- Aquatic Therapy, [phone]- Electrical stimulation (manual), [id number]- Vasopneumatic device, [id number]- Traction (mechanical), [id number]- Ionotophoresis 4mg /ml Dexamethasone, Taping, Dry Needling, Joint manipulation, and Spinal manipulation.   Alphonzo Severance PT, DPT 07/22/2023, 2:28 PM  Date of referral: 3/28 Referring provider: Moshe Cipro Referring diagnosis? Acute pain of left knee [M25.562]  Treatment diagnosis? (if different than referring diagnosis) Pain of left knee [M25.562]  What was this (referring dx) caused by? Unspecified  Nature of Condition: Initial Onset (within last 3 months)   Laterality: Lt  Current Functional Measure Score: LEFS 71/80  Objective measurements identify impairments when they are compared to normal values, the uninvolved extremity, and prior level of function.  [x]  Yes  []  No  Objective assessment of functional ability: Minimal functional limitations   Briefly describe symptoms: L lateral and posterior knee pain with recreation and heavy housework  How did symptoms start: slow onset  Average pain intensity:  Last 24 hours: 4  Past week: 4  How often does the pt experience symptoms? Frequently  How much have the symptoms interfered with usual daily activities? Moderately  How has condition changed since care began at this facility? NA - initial visit  In general, how is the patients overall health? Good   BACK PAIN (STarT Back Screening Tool) No

## 2023-07-31 DIAGNOSIS — H40013 Open angle with borderline findings, low risk, bilateral: Secondary | ICD-10-CM | POA: Diagnosis not present

## 2023-08-07 ENCOUNTER — Ambulatory Visit: Admitting: Physical Therapy

## 2023-08-07 ENCOUNTER — Encounter: Payer: Self-pay | Admitting: Physical Therapy

## 2023-08-07 DIAGNOSIS — R42 Dizziness and giddiness: Secondary | ICD-10-CM

## 2023-08-07 DIAGNOSIS — M6281 Muscle weakness (generalized): Secondary | ICD-10-CM

## 2023-08-07 DIAGNOSIS — M25562 Pain in left knee: Secondary | ICD-10-CM | POA: Diagnosis not present

## 2023-08-07 NOTE — Therapy (Addendum)
 PHYSICAL THERAPY UNPLANNED DISCHARGE SUMMARY   Visits from Start of Care: 2  Current functional level related to goals / functional outcomes: Current status unknown   Remaining deficits: Current status unknown   Education / Equipment: Pt has not returned since visit listed below  Patient goals were not assessed. Patient is being discharged due to not returning since the last visit.  (the note below was addended to include the above D/C summary on 03/16/24)  OUTPATIENT PHYSICAL THERAPY DAILY NOTE  Patient Name: Richard Middleton MRN: [id number] DOB:[birth year], 78 y.o., male Today's Date: 08/07/2023   PT End of Session - 08/07/23 0929     Visit Number 2    Number of Visits --   1-2x/week   Date for PT Re-Evaluation 09/16/23    Authorization Type [employer] - LEFS    Progress Note Due on Visit 10    PT Start Time 0930    PT Stop Time 1011    PT Time Calculation (min) 41 min             Past Medical History:  Diagnosis Date   Abnormal glucose    History of small bowel obstruction    x2   Pneumonia    as a child   Past Surgical History:  Procedure Laterality Date   ACHILLES TENDON SURGERY Right 05/02/2017   Procedure: RIGHT ACHILLES RECONSTRUCTION;  Surgeon: Harden Jerona GAILS, MD;  Location: [facility] OR;  Service: Orthopedics;  Laterality: Right;   COLONOSCOPY  2008   LAPAROSCOPIC SMALL BOWEL RESECTION  3 1/2 years ago, & 10 years ago   x2   LAPAROTOMY N/A 07/13/2014   Procedure: EXPLORATORY LAPAROTOMY lysis of adhesions;  Surgeon: Bernarda Ned, MD;  Location: [facility] ORS;  Service: General;  Laterality: N/A;   LUMBAR LAMINECTOMY/DECOMPRESSION MICRODISCECTOMY N/A 05/18/2018   Procedure: L4-5 DECOMPRESSION, RIGHT MICRODISCECTOMY;  Surgeon: Barbarann Oneil BROCKS, MD;  Location: [facility] OR;  Service: Orthopedics;  Laterality: N/A;   Patient Active Problem List   Diagnosis Date Noted   Atherosclerosis of aorta (HCC) 12/03/2019   Prediabetes 12/03/2019   Partial small bowel obstruction (HCC)  10/05/2018   Status post lumbar spine operative procedure for decompression of spinal cord 06/26/2018   Foot drop, right 05/05/2018   ED (erectile dysfunction) 08/27/2017   BPH associated with nocturia 08/27/2017   Achilles tendon tear, right, subsequent encounter 05/14/2017   Seborrheic keratoses 08/21/2016   Small bowel obstruction (HCC) 07/11/2014   Tendinitis of forearm 04/19/2013   Routine general medical examination at a health care facility 01/24/2012   Lipoma 06/29/2010   TINEA BARBAE 01/22/2010   SMALL BOWEL OBSTRUCTION, HX OF 02/19/2007    PCP: Jordan, Betty G, MD  REFERRING PROVIDER: Alvia Corean CROME, *  THERAPY DIAG:  Left knee pain, unspecified chronicity  Muscle weakness  Dizziness and giddiness  REFERRING DIAG:  Acute pain of left knee [M25.562]   Rationale for Evaluation and Treatment:  Rehabilitation  SUBJECTIVE:  PERTINENT PAST HISTORY:  R foot drop, history of lumbar decompression      PRECAUTIONS: None  WEIGHT BEARING RESTRICTIONS No  FALLS:  Has patient fallen in last 6 months? No, Number of falls: 0  MOI/History of condition:  Onset date: 3 weeks ago  SUBJECTIVE STATEMENT  08/07/2023: Pt reports that his pain has improved and is consolidated in a smaller area  EVAL:  Richard Middleton is a 78 y.o. male who presents to clinic with chief complaint of L knee pain which started  a few weeks ago.  The first sxs occurred in he posterior knee and are now more lateral.  He is very active and walks regularly with his wife.  The pain is variable.  No recent injury or truama.  He believes there is some slight swelling.  No abnormal clicking or popping.  Pain:  Are you having pain? Yes Pain location: L knee around fibular head NPRS scale:  0/10 to 8/10 Aggravating factors: slow build after walking or being active Relieving factors: rest Pain description: dull and aching Stage: Subacute 24 hour pattern: worse with activity   Occupation:  na  Assistive Device: na  Hand Dominance: na  Patient Goals/Specific Activities: reduce pain, be able to walk normally    OBJECTIVE:   DIAGNOSTIC FINDINGS:   IMPRESSION: 1. Mild degenerative spurring in the medial and patellofemoral compartments. 2. Small knee effusion in the suprapatellar bursa.  SENSATION: Light touch: Appears intact  PALPATION: No current TTP, pt reports pain at lateral joint line and possibly L lateral HS  MUSCLE LENGTH: Hamstrings: Right no restriction; Left no restriction Ely's test: Right subtle restriction; Left subtle restriction  LE MMT:  MMT Right (Eval) Left (Eval)  Hip flexion (L2, L3) 5 4+  Knee extension (L3) 4+ 4+  Knee flexion 4+ 4+  Hip abduction 4+ 4+  Hip extension    Hip external rotation    Hip internal rotation    Hip adduction    Ankle dorsiflexion (L4)    Ankle plantarflexion (S1)  10x partial ROM S/L heel raise with pain  Ankle inversion    Ankle eversion    Great Toe ext (L5)    Grossly     (Blank rows = not tested, score listed is out of 5 possible points.  N = WNL, D = diminished, C = clear for gross weakness with myotome testing, * = concordant pain with testing)  LE ROM:  ROM Right (Eval) Left (Eval)  Hip flexion    Hip extension    Hip abduction    Hip adduction    Hip internal rotation    Hip external rotation    Knee extension n n  Knee flexion n n  Ankle dorsiflexion    Ankle plantarflexion    Ankle inversion    Ankle eversion     (Blank rows = not tested, N = WNL, * = concordant pain with testing)  Functional Tests  Eval    SLS balance >10'' ea with more difficulty on R                                                          PATIENT SURVEYS:  LEFS: 71/80   TODAY'S TREATMENT:  [facility] Adult PT Treatment  08/07/2023:  Therapeutic Exercise: Elliptical 5' Slant board stretch - 1' x2 Deficit heel raise - 15x 2 up 1 down heel raise L  Knee ext machine - bil 35# Knee ext  with band - GTB - 2x10 Goblet squat - 10# - 2x10  Neuromuscular re-ed: Tandem on foam SLS   HOME EXERCISE PROGRAM: Access Code: [email or web address] URL: [email or web address] Date: 08/07/2023 Prepared by: [name]  Exercises - Gastroc Stretch on Wall  - 2 x daily - 7 x weekly - 1 sets - 3 reps - 45 seconds hold - Gastroc Stretch with  Foot at Wall  - 2 x daily - 7 x weekly - 3 reps - 45 second hold - Goblet Squat with Kettlebell  - 1 x daily - 4-7 x weekly - 3 sets - 10 reps - Standing Eccentric Heel Raise  - 1 x daily - 4-7 x weekly - 3 sets - 10-15 reps - Sitting Knee Extension with Resistance  - 1 x daily - 4-7 x weekly - 3 sets - 10 reps - Single Leg Stance  - 2 x daily - 6 x weekly - 1 sets - 3 reps - 45-60 sec hold  Treatment priorities   Eval        L G/S complex                                          ASSESSMENT:  CLINICAL IMPRESSION:  08/07/2023: Oliver is progressing well with decreased pain and improved activity tolerance.  R quad and balance deficit compared to L today possibly d/t remote AT repair.  Updated HEP and encouraged him to perform exercises on both sides.  EVAL: Lennell is a 78 y.o. male who presents to clinic with signs and sxs consistent with L lateral/posterior knee pain which started about 3 weeks ago with no trauma.  Appears most consistent with lateral gastroc tendinopathy.  Concordant pain with g/s strech and heel raise.  Pt will benefit form skilled PT to address relevant deficits and improve comfort with recreation activities such as walking and gardening.  Recommended HEP and to get fitted for new walking shoes at [employer].  OBJECTIVE IMPAIRMENTS: Pain, L G/S strength  ACTIVITY LIMITATIONS: squatting, walking, tennis, gardening  PERSONAL FACTORS: See medical history and pertinent history   REHAB POTENTIAL: Good  CLINICAL DECISION MAKING: Stable/uncomplicated  EVALUATION COMPLEXITY: Low   GOALS:   SHORT TERM GOALS:  Target date: 08/19/2023   Jumar will be >75% HEP compliant to improve carryover between sessions and facilitate independent management of condition  Evaluation: ongoing Goal status: INITIAL   LONG TERM GOALS: Target date: 09/16/2023   Corbitt will self report >/= 50% decrease in pain from evaluation to improve function in daily tasks  Evaluation/Baseline: 8/10 max pain Goal status: INITIAL   2.  Oakes will be able to walk for 4 miles, not limited by pain   Evaluation/Baseline: up to 8/10 pain Goal status: INITIAL   3.  Lamont will show a >/= 4 pt improvement in LEFS score (MCID is ~11% or 9 pts) as a proxy for functional improvement   Evaluation/Baseline: 71/80 pts Goal status: INITIAL   4.  Tycho will report confidence in self management of condition at time of discharge with advanced HEP  Evaluation/Baseline: unable to self manage Goal status: INITIAL  5. Xzavion will be able to perform 10x L SL heel raises, not limited by pain  Evaluation/Baseline: partial range, with pain Goal status: INITIAL    PLAN: PT FREQUENCY: 1-2x/week  PT DURATION: 8 weeks  PLANNED INTERVENTIONS:  [id number]- PT Re-evaluation, 97110-Therapeutic exercises, 97530- Therapeutic activity, 97112- Neuromuscular re-education, 97535- Self Care, [id number]- Manual therapy, [id number]- Gait training, [id number]- Aquatic Therapy, [phone]- Electrical stimulation (manual), [id number]- Vasopneumatic device, [id number]- Traction (mechanical), [id number]- Ionotophoresis 4mg /ml Dexamethasone , Taping, Dry Needling, Joint manipulation, and Spinal manipulation.   [name] PT, DPT 08/07/2023, 11:05 AM  Date of referral: 3/28 Referring provider: Alvia Krabbe Referring diagnosis? Acute pain of left knee [  M25.562]  Treatment diagnosis? (if different than referring diagnosis) Pain of left knee [M25.562]  What was this (referring dx) caused by? Unspecified  Nature of Condition: Initial Onset (within last 3 months)   Laterality:  Lt  Current Functional Measure Score: LEFS 71/80  Objective measurements identify impairments when they are compared to normal values, the uninvolved extremity, and prior level of function.  [x]  Yes  []  No  Objective assessment of functional ability: Minimal functional limitations   Briefly describe symptoms: L lateral and posterior knee pain with recreation and heavy housework  How did symptoms start: slow onset  Average pain intensity:  Last 24 hours: 4  Past week: 4  How often does the pt experience symptoms? Frequently  How much have the symptoms interfered with usual daily activities? Moderately  How has condition changed since care began at this facility? NA - initial visit  In general, how is the patients overall health? Good   BACK PAIN (STarT Back Screening Tool) No

## 2023-08-08 ENCOUNTER — Ambulatory Visit: Admitting: Gastroenterology

## 2023-08-08 ENCOUNTER — Encounter: Admitting: Physical Therapy

## 2023-08-15 ENCOUNTER — Ambulatory Visit (HOSPITAL_BASED_OUTPATIENT_CLINIC_OR_DEPARTMENT_OTHER)

## 2023-08-15 ENCOUNTER — Telehealth: Payer: Self-pay | Admitting: Family Medicine

## 2023-08-15 ENCOUNTER — Ambulatory Visit: Payer: Self-pay | Admitting: *Deleted

## 2023-08-15 ENCOUNTER — Ambulatory Visit (HOSPITAL_BASED_OUTPATIENT_CLINIC_OR_DEPARTMENT_OTHER): Admitting: Physician Assistant

## 2023-08-15 ENCOUNTER — Encounter (HOSPITAL_BASED_OUTPATIENT_CLINIC_OR_DEPARTMENT_OTHER): Payer: Self-pay | Admitting: Physician Assistant

## 2023-08-15 DIAGNOSIS — M25511 Pain in right shoulder: Secondary | ICD-10-CM | POA: Diagnosis not present

## 2023-08-15 DIAGNOSIS — M19011 Primary osteoarthritis, right shoulder: Secondary | ICD-10-CM | POA: Diagnosis not present

## 2023-08-15 MED ORDER — MELOXICAM 7.5 MG PO TABS: 7.5000 mg | ORAL_TABLET | Freq: Every day | ORAL | 0 refills | Status: DC

## 2023-08-15 NOTE — Progress Notes (Signed)
 Office Visit Note   Patient: Richard Middleton           Date of Birth: 02/01/1946           MRN: [id number] Visit Date: 08/15/2023              Requested by: [place], Betty G, MD [address] [place],  [place] [address] PCP: [place], Betty G, MD   Assessment & Plan: Visit Diagnoses:  1. Acute pain of right shoulder     Plan: Patient is a pleasant active 78 year old gentleman who comes in today with a chief complaint of 2 to 3 days of right posterior shoulder shoulder blade pain.  He admits he was lifting something heavy and carrying it at significant distance.  He denies any weakness paresthesias previous injuries exam today could be rotator cuff but based on where he is most sure I think this is just mostly muscular.  We talked about the natural history of this.  Discussed trying a course of meloxicam  to take once a day with food for the next few weeks.  Also stretching and topical Voltaren gel or Tiger balm which is his preference.  Can follow-up with me in 3 weeks if he is no better  Follow-Up Instructions: Return if symptoms worsen or fail to improve.   Orders:  Orders Placed This Encounter  Procedures   DG Shoulder Right   Meds ordered this encounter  Medications   meloxicam  (MOBIC ) 7.5 MG tablet    Sig: Take 1 tablet (7.5 mg total) by mouth daily.    Dispense:  30 tablet    Refill:  0      Procedures: No procedures performed   Clinical Data: No additional findings.   Subjective: Chief Complaint  Patient presents with   Right Shoulder - Pain    HPI Pleasant 78 year old gentleman who is extremely active.  He has a 2 to 3-day history of right posterior shoulder pain radiating into his lateral border of his scapula.  He said this was occurred after carrying some heavy items while he was working in his garden.  Denies any fever chills shortness of breath  Review of Systems  All other systems reviewed and are negative.    Objective: Vital Signs: There were  no vitals taken for this visit.  Physical Exam Constitutional:      Appearance: Normal appearance.  Pulmonary:     Effort: Pulmonary effort is normal.  Neurological:     General: No focal deficit present.     Mental Status: He is alert and oriented to person, place, and time.  Psychiatric:        Mood and Affect: Mood normal.        Behavior: Behavior normal.     Ortho Exam Exam of his right shoulder no deformity.  He has forward elevation if he goes slowly without pain.  Negative empty can test negative speeds test he has excellent strength with resisted abduction external and internal rotation Specialty Comments:  No specialty comments available.  Imaging: No results found.   PMFS History: Patient Active Problem List   Diagnosis Date Noted   Atherosclerosis of aorta (HCC) 12/03/2019   Prediabetes 12/03/2019   Partial small bowel obstruction (HCC) 10/05/2018   Status post lumbar spine operative procedure for decompression of spinal cord 06/26/2018   Foot drop, right 05/05/2018   ED (erectile dysfunction) 08/27/2017   BPH associated with nocturia 08/27/2017   Achilles tendon tear, right, subsequent encounter  05/14/2017   Seborrheic keratoses 08/21/2016   Small bowel obstruction (HCC) 07/11/2014   Tendinitis of forearm 04/19/2013   Routine general medical examination at a health care facility 01/24/2012   Lipoma 06/29/2010   TINEA BARBAE 01/22/2010   SMALL BOWEL OBSTRUCTION, HX OF 02/19/2007   Past Medical History:  Diagnosis Date   Abnormal glucose    History of small bowel obstruction    x2   Pneumonia    as a child    Family History  Problem Relation Age of Onset   Arthritis Mother    Pulmonary embolism Father    Cancer Neg Hx    Diabetes Neg Hx    Colon cancer Neg Hx     Past Surgical History:  Procedure Laterality Date   ACHILLES TENDON SURGERY Right 05/02/2017   Procedure: RIGHT ACHILLES RECONSTRUCTION;  Surgeon: Timothy Ford, MD;  Location: [facility] OR;   Service: Orthopedics;  Laterality: Right;   COLONOSCOPY  2008   LAPAROSCOPIC SMALL BOWEL RESECTION  3 1/2 years ago, & 10 years ago   x2   LAPAROTOMY N/A 07/13/2014   Procedure: EXPLORATORY LAPAROTOMY lysis of adhesions;  Surgeon: Joyce Nixon, MD;  Location: [facility] OR[facility];  Service: General;  Laterality: N/A;   LUMBAR LAMINECTOMY/DECOMPRESSION MICRODISCECTOMY N/A 05/18/2018   Procedure: L4-5 DECOMPRESSION, RIGHT MICRODISCECTOMY;  Surgeon: Adah Acron, MD;  Location: [facility] OR;  Service: Orthopedics;  Laterality: N/A;   Social History   Occupational History   Occupation: retired  Tobacco Use   Smoking status: Former    Current packs/day: 0.00    Average packs/day: 4.0 packs/day for 4.0 years (16.0 ttl pk-yrs)    Types: Cigarettes    Start date: 04/15/1966    Quit date: 04/15/1970    Years since quitting: 53.3   Smokeless tobacco: Never  Vaping Use   Vaping status: Never Used  Substance and Sexual Activity   Alcohol use: Yes    Alcohol/week: 1.0 standard drink of alcohol    Types: 1 Glasses of wine per week    Comment: 2-3 times per month    Drug use: No   Sexual activity: Yes

## 2023-08-15 NOTE — Telephone Encounter (Signed)
  Chief Complaint: shoulder pain- possible strain Symptoms: R shoulder blade pain with activity after working in yard Frequency: 2-3 days Pertinent Negatives: Patient denies neck pain, swelling, rash, fever, numbness, weakness  Disposition: [] ED /[x] Urgent Care (no appt availability in office) / [] Appointment(In office/virtual)/ []  [place] [facility]/ [] Home Care/ [] Refused Recommended Disposition /[] [place] [facility]/ []  Follow-up with PCP Additional Notes: No office appointment- advised ortho UC for evaluation of shoulder - number/address given   Copied from CRM [id number]. Topic: Clinical - Red Word Triage >> Aug 15, 2023 12:10 PM Richard Middleton wrote: Red Word that prompted transfer to Nurse Triage: Pt experience pain in my right shoulder when coughing pain in the shoulder Reason for Disposition  [1] Shoulder pains with exertion (Middleton.g., walking) AND [2] pain goes away on resting AND [3] not present now  Answer Assessment - Initial Assessment Questions 1. ONSET: "When did the pain start?"     Started 2-3 days ago 2. LOCATION: "Where is the pain located?"     Shoulder blade pain-R 3. PAIN: "How bad is the pain?" (Scale 1-10; or mild, moderate, severe)   - MILD (1-3): doesn't interfere with normal activities   - MODERATE (4-7): interferes with normal activities (Middleton.g., work or school) or awakens from sleep   - SEVERE (8-10): excruciating pain, unable to do any normal activities, unable to move arm at all due to pain     Primarily when bending/using the muscle 4. WORK OR EXERCISE: "Has there been any recent work or exercise that involved this part of the body?"     Working in garden- picked up heavy pail 5. CAUSE: "What do you think is causing the shoulder pain?"     Over use of muscle 6. OTHER SYMPTOMS: "Do you have any other symptoms?" (Middleton.g., neck pain, swelling, rash, fever, numbness, weakness)     no  Protocols used: Shoulder Pain-A-AH

## 2023-08-15 NOTE — Telephone Encounter (Unsigned)
 Copied from CRM [id number]. Topic: Clinical - Red Word Triage >> Aug 15, 2023 12:10 PM Richard Middleton wrote: Red Word that prompted transfer to Nurse Triage: Pt experience pain in my right shoulder when coughing pain in the shoulder

## 2023-09-23 ENCOUNTER — Ambulatory Visit: Payer: Self-pay

## 2023-09-23 ENCOUNTER — Encounter: Payer: Self-pay | Admitting: Family Medicine

## 2023-09-23 ENCOUNTER — Ambulatory Visit (INDEPENDENT_AMBULATORY_CARE_PROVIDER_SITE_OTHER): Admitting: Family Medicine

## 2023-09-23 VITALS — BP 124/80 | HR 71 | Temp 98.4°F | Wt 160.6 lb

## 2023-09-23 DIAGNOSIS — W57XXXA Bitten or stung by nonvenomous insect and other nonvenomous arthropods, initial encounter: Secondary | ICD-10-CM | POA: Diagnosis not present

## 2023-09-23 DIAGNOSIS — S30861A Insect bite (nonvenomous) of abdominal wall, initial encounter: Secondary | ICD-10-CM

## 2023-09-23 MED ORDER — DOXYCYCLINE HYCLATE 100 MG PO TABS: 100.0000 mg | ORAL_TABLET | Freq: Two times a day (BID) | ORAL | 0 refills | Status: DC

## 2023-09-23 NOTE — Progress Notes (Signed)
   Subjective:    Patient ID: Richard Middleton, male    DOB: 1945/12/27, 78 y.o.   MRN: [id number]  HPI Here with concerns about possible tick bites. He works outside in his yard most every day. He feels fine. However his wife pulled a tick off her arm this morning, and she went to see her doctor. He has noticed a few red bumps on his skin today.    Review of Systems  Constitutional: Negative.   Respiratory: Negative.    Cardiovascular: Negative.   Musculoskeletal: Negative.   Neurological: Negative.        Objective:   Physical Exam Constitutional:      Appearance: Normal appearance.  Cardiovascular:     Rate and Rhythm: Normal rate and regular rhythm.     Pulses: Normal pulses.  Pulmonary:     Effort: Pulmonary effort is normal.     Breath sounds: Normal breath sounds.  Skin:    Comments: There is a small red papular lesion consistent with an insect bite on his left flank, there is also one on the left lower leg   Neurological:     Mental Status: He is alert.           Assessment & Plan:  He likely has some tick bites, so we will treat him with 7 days of Doxycycline. Recheck as needed.  Corita Diego, MD

## 2023-09-23 NOTE — Telephone Encounter (Signed)
 FYI Only or Action Required?: FYI only for provider  Patient was last seen in primary care on 07/11/2023 by [name], FNP. Called Nurse Triage reporting Tick Removal. Symptoms began today. Interventions attempted: Nothing. Symptoms are: gradually worsening Triage Disposition: See Physician Within 24 Hours  Patient/caregiver understands and will follow disposition?: Yes   Copied From CRM [id number]. Reason for Triage: Found a tick in the house and has it in a bag. A couple areas on body that are questionable, along with fatigue. Can someone look and verify what type of bite he has and what symptoms to look out for + further advice.    Reason for Disposition  [1] Red or very tender (to touch) area AND [2] started over 24 hours after the bite    Pt stated has 2 insect bite marks: possible tick bites but unknown b/c no tick found on pt - however tick & tick bite found on wife & recently found 2 ticks in home over past 2 weeks.  Answer Assessment - Initial Assessment Questions 1. TYPE of INSECT: "What type of insect was it?"      Possible tick bite  2. ONSET: "When did you get bitten?"      Possible tick bite today 3. LOCATION: "Where is the insect bite located?"      2 locations: Left leg/thigh and  left lower leg,  4. REDNESS: "Is the area red or pink?" If Yes, ask: "What size is area of redness?" (inches or cm). "When did the redness start?"     yes 5. PAIN: "Is there any pain?" If Yes, ask: "How bad is it?"  (Scale 1-10; or mild, moderate, severe)     discomfort 6. ITCHING: "Does it itch?" If Yes, ask: "How bad is the itch?"    - MILD: doesn't interfere with normal activities   - MODERATE-SEVERE: interferes with work, school, sleep, or other activities      Moderate 7. SWELLING: "How big is the swelling?" (inches, cm, or compare to coins)     Some swelling & redness to areas in question of tick 8. OTHER SYMPTOMS: "Do you have any other symptoms?"  (e.g., difficulty breathing,  hives)     Red area is about 1/2 nickel, small 9. PREGNANCY: "Is there any chance you are pregnant?" "When was your last menstrual period?"     N/a Pt has found 2 ticks in house: 1 last week & another today.  Pt c/o  two possible insect bites of a tick.  Area has some redness, itching, discomfort.  Protocols used: Insect Bite-A-AH

## 2023-10-01 ENCOUNTER — Encounter: Payer: Self-pay | Admitting: Family Medicine

## 2023-10-01 ENCOUNTER — Ambulatory Visit: Payer: Self-pay

## 2023-10-01 ENCOUNTER — Ambulatory Visit (INDEPENDENT_AMBULATORY_CARE_PROVIDER_SITE_OTHER): Admitting: Family Medicine

## 2023-10-01 VITALS — BP 118/70 | HR 69 | Temp 97.9°F | Wt 159.7 lb

## 2023-10-01 DIAGNOSIS — M2550 Pain in unspecified joint: Secondary | ICD-10-CM

## 2023-10-01 NOTE — Patient Instructions (Signed)
 Let's see if symptoms improve after another couple of days off the Doxycycline   Continue with the Tylenol  as needed.   Let me know if joint pains not improving.

## 2023-10-01 NOTE — Progress Notes (Signed)
 Established Patient Office Visit  Subjective   Patient ID: Richard Middleton, male    DOB: 27-Jul-1945  Age: 78 y.o. MRN: [id number]  Chief Complaint  Patient presents with   Joint Pain   Generalized Body Aches    HPI   Richard Middleton is seen today with some joint aches involving knees, shoulders, back.  He had recent history of rash left leg and left hip.  There was no definitive tick bite but this was presumed.  He was treated with doxycycline  and just finished that yesterday.  He states that he has not had any fever.  Rash is essentially resolved.  Did not have any erythema migrans type rash.  No headaches.  Patient queried whether his aches could be related to doxycycline .  He has not had any other side effects.  He stays very active and enjoys gardening.  Has not noted any obvious joint inflammation.  Took some Tylenol  which seemed to help.  Denies any respiratory symptoms.  Did finish full 7 days of doxycycline  100 mg twice daily.  Does not report any upper back or neck stiffness or soreness.  He has other medical problems including history of hyperlipidemia, BPH, prediabetes.  Takes lovastatin  but has been on this for several years.  No history of associated myalgias with statin.  Past Medical History:  Diagnosis Date   Abnormal glucose    History of small bowel obstruction    x2   Pneumonia    as a child   Past Surgical History:  Procedure Laterality Date   ACHILLES TENDON SURGERY Right 05/02/2017   Procedure: RIGHT ACHILLES RECONSTRUCTION;  Surgeon: Richard Ford, MD;  Location: [facility] OR;  Service: Orthopedics;  Laterality: Right;   COLONOSCOPY  2008   LAPAROSCOPIC SMALL BOWEL RESECTION  3 1/2 years ago, & 10 years ago   x2   LAPAROTOMY N/A 07/13/2014   Procedure: EXPLORATORY LAPAROTOMY lysis of adhesions;  Surgeon: Richard Nixon, MD;  Location: [facility] ORS;  Service: General;  Laterality: N/A;   LUMBAR LAMINECTOMY/DECOMPRESSION MICRODISCECTOMY N/A 05/18/2018   Procedure: L4-5 DECOMPRESSION,  RIGHT MICRODISCECTOMY;  Surgeon: Richard Acron, MD;  Location: [facility] OR;  Service: Orthopedics;  Laterality: N/A;    reports that he quit smoking about 53 years ago. His smoking use included cigarettes. He started smoking about 57 years ago. He has a 16 pack-year smoking history. He has never used smokeless tobacco. He reports current alcohol use of about 1.0 standard drink of alcohol per week. He reports that he does not use drugs. family history includes Arthritis in his mother; Pulmonary embolism in his father. No Known Allergies  Review of Systems  Constitutional:  Negative for chills and fever.  Respiratory:  Negative for cough.   Cardiovascular:  Negative for chest pain.  Gastrointestinal:  Negative for abdominal pain.  Musculoskeletal:  Positive for joint pain.  Neurological:  Negative for headaches.      Objective:     BP 118/70 (BP Location: Left Arm, Patient Position: Sitting, Cuff Size: Normal)   Pulse 69   Temp 97.9 F (36.6 C) (Oral)   Wt 159 lb 11.2 oz (72.4 kg)   SpO2 98%   BMI 21.07 kg/m  BP Readings from Last 3 Encounters:  10/01/23 118/70  09/23/23 124/80  07/11/23 136/82   Wt Readings from Last 3 Encounters:  10/01/23 159 lb 11.2 oz (72.4 kg)  09/23/23 160 lb 9.6 oz (72.8 kg)  07/11/23 165 lb 6.4 oz (75 kg)  Physical Exam Vitals reviewed.  Constitutional:      General: He is not in acute distress.    Appearance: He is not ill-appearing.   Cardiovascular:     Rate and Rhythm: Normal rate and regular rhythm.  Pulmonary:     Effort: Pulmonary effort is normal.     Breath sounds: Normal breath sounds. No wheezing or rales.   Musculoskeletal:     Cervical back: Neck supple.     Right lower leg: No edema.     Left lower leg: No edema.     Comments: No visible joint edema or erythema.  No increased warmth.   Skin:    Findings: No rash.   Neurological:     Mental Status: He is alert.      No results found for any visits on  10/01/23.  Last CBC Lab Results  Component Value Date   WBC 4.2 05/19/2023   HGB 14.5 05/19/2023   HCT 44.6 05/19/2023   MCV 85.2 05/19/2023   MCH 27.0 10/06/2018   RDW 13.6 05/19/2023   PLT 183.0 05/19/2023   Last metabolic panel Lab Results  Component Value Date   GLUCOSE 88 09/23/2022   NA 134 (L) 09/23/2022   K 4.1 09/23/2022   CL 99 09/23/2022   CO2 25 09/23/2022   BUN 22 09/23/2022   CREATININE 1.15 09/23/2022   GFR 62.01 09/23/2022   CALCIUM 9.0 09/23/2022   PROT 6.7 09/23/2022   ALBUMIN 3.9 09/23/2022   BILITOT 0.4 09/23/2022   ALKPHOS 92 09/23/2022   AST 22 09/23/2022   ALT 16 09/23/2022   ANIONGAP 6 10/06/2018   Last hemoglobin A1c Lab Results  Component Value Date   HGBA1C 5.9 12/27/2022      The ASCVD Risk score (Arnett DK, et al., 2019) failed to calculate for the following reasons:   The valid total cholesterol range is 130 to 320 mg/dL    Assessment & Plan:   Patient is seen with some arthralgias involving knees, shoulders, back.  Possible recent insect bite but no definitive tick bite.  He was treated promptly with doxycycline  for 1 week which makes likelihood of this being related to anything like acute Lyme disease very unlikely.  No erythema migrans rash.  No headaches.  Patient wonders if symptoms may be related to doxycycline  and that is certainly possible, although uncommon side effect.Richard Middleton  He finished doxycycline  yesterday and symptoms already somewhat improved today.  Recommend:  -Follow-up promptly for any fever or new rash - Continue Tylenol  as needed - Be in touch if arthralgias not improving and resolving over the next few days  Richard Lamy, MD

## 2023-10-01 NOTE — Telephone Encounter (Signed)
 FYI Only or Action Required?: FYI only for provider  Patient was last seen in primary care on 09/23/2023 by Donley Furth, MD. Called Nurse Triage reporting Generalized Body Aches. Symptoms began a week ago. Interventions attempted: OTC medications: tylenol . Symptoms are: unchanged.  Triage Disposition: See Physician Within 24 Hours  Patient/caregiver understands and will follow disposition?: Yes    Patient was taking a medication for a tick bite, side effect of joint pain and he is having more than normal of those side effects, wanting to know if there is something that he can do to help with that   Reason for Disposition  [1] Muscle aches are unexplained AND [2] occur within 1 month of a tick bite  Answer Assessment - Initial Assessment Questions 1. ONSET: When did the muscle aches or body pains start?       About a week ago 2. LOCATION: What part of your body is hurting? (e.g., entire body, arms, legs)      Back, shoulder and knees 3. SEVERITY: How bad is the pain? (Scale 1-10; or mild, moderate, severe)   - MILD (1-3): doesn't interfere with normal activities    - MODERATE (4-7): interferes with normal activities or awakens from sleep    - SEVERE (8-10):  excruciating pain, unable to do any normal activities      5/10 4. CAUSE: What do you think is causing the pains?     doxycyline 5. FEVER: Have you been having fever?     no 6. OTHER SYMPTOMS: Do you have any other symptoms? (e.g., chest pain, weakness, rash, cold or flu symptoms, weight loss)     no  Protocols used: Muscle Aches and Body Pain-A-AH

## 2023-10-02 ENCOUNTER — Ambulatory Visit: Admitting: Gastroenterology

## 2023-10-02 ENCOUNTER — Encounter: Payer: Self-pay | Admitting: Gastroenterology

## 2023-10-02 VITALS — BP 138/70 | HR 90 | Ht 73.0 in | Wt 159.1 lb

## 2023-10-02 DIAGNOSIS — Z860101 Personal history of adenomatous and serrated colon polyps: Secondary | ICD-10-CM | POA: Diagnosis not present

## 2023-10-02 DIAGNOSIS — Z09 Encounter for follow-up examination after completed treatment for conditions other than malignant neoplasm: Secondary | ICD-10-CM | POA: Diagnosis not present

## 2023-10-02 MED ORDER — NA SULFATE-K SULFATE-MG SULF 17.5-3.13-1.6 GM/177ML PO SOLN
1.0000 | Freq: Once | ORAL | 0 refills | Status: AC
Start: 2023-10-02 — End: 2023-10-02

## 2023-10-02 NOTE — Patient Instructions (Signed)
 You have been scheduled for a colonoscopy. Please follow written instructions given to you at your visit today.   If you use inhalers (even only as needed), please bring them with you on the day of your procedure.  DO NOT TAKE 7 DAYS PRIOR TO TEST- Trulicity (dulaglutide) Ozempic, Wegovy (semaglutide) Mounjaro (tirzepatide) Bydureon Bcise (exanatide extended release)  DO NOT TAKE 1 DAY PRIOR TO YOUR TEST Rybelsus (semaglutide) Adlyxin (lixisenatide) Victoza (liraglutide) Byetta (exanatide) ______________________________________________________________________  _______________________________________________________  If your blood pressure at your visit was 140/90 or greater, please contact your primary care physician to follow up on this.  _______________________________________________________  If you are age 22 or older, your body mass index should be between 23-30. Your Body mass index is 20.99 kg/m. If this is out of the aforementioned range listed, please consider follow up with your Primary Care Provider.  If you are age 63 or younger, your body mass index should be between 19-25. Your Body mass index is 20.99 kg/m. If this is out of the aformentioned range listed, please consider follow up with your Primary Care Provider.   ________________________________________________________  The [facility] providers would like to encourage you to use MYCHART to communicate with providers for non-urgent requests or questions.  Due to long hold times on the telephone, sending your provider a message by [facility] may be a faster and more efficient way to get a response.  Please allow 48 business hours for a response.  Please remember that this is for non-urgent requests.  _______________________________________________________

## 2023-10-02 NOTE — Progress Notes (Signed)
 10/02/2023 AMARRI SATTERLY [id number] 19-Dec-1945   HISTORY OF PRESENT ILLNESS:  This is a 78 year old male who is a patient of Dr. Revonda Castles.  He is here today to discuss and schedule surveillance colonoscopy.  His last colonoscopy was in 10/2016 as below, 7 year recall.  He feels well with no GI complaints.  Moves his bowels well with no rectal bleeding.  He has minimal/limited PMH, minimal medications.  Colonoscopy 10/2016:  - One 2 mm polyp in the rectum, removed piecemeal using a cold biopsy forceps. Resected and retrieved. - Diverticulosis in the sigmoid colon. - The examination was otherwise normal on direct and retroflexion views.  Tubular adenoma on pathology.  Past Medical History:  Diagnosis Date   Abnormal glucose    History of small bowel obstruction    x2   Pneumonia    as a child   Past Surgical History:  Procedure Laterality Date   ACHILLES TENDON SURGERY Right 05/02/2017   Procedure: RIGHT ACHILLES RECONSTRUCTION;  Surgeon: Timothy Ford, MD;  Location[facility] OR;  Service: Orthopedics;  Laterality: Right;   COLONOSCOPY  2008   LAPAROSCOPIC SMALL BOWEL RESECTION  3 1/2 years ago, & 10 years ago   x2   LAPAROTOMY N/A 07/13/2014   Procedure: EXPLORATORY LAPAROTOMY lysis of adhesions;  Surgeon: Joyce Nixon, MD;  Location: [facility] ORS;  Service: General;  Laterality: N/A;   LUMBAR LAMINECTOMY/DECOMPRESSION MICRODISCECTOMY N/A 05/18/2018   Procedure: L4-5 DECOMPRESSION, RIGHT MICRODISCECTOMY;  Surgeon: Adah Acron, MD;  Location: [facility] OR;  Service: Orthopedics;  Laterality: N/A;    reports that he quit smoking about 53 years ago. His smoking use included cigarettes. He started smoking about 57 years ago. He has a 16 pack-year smoking history. He has never used smokeless tobacco. He reports current alcohol use of about 1.0 standard drink of alcohol per week. He reports that he does not use drugs. family history includes Arthritis in his mother; Pulmonary embolism in his father. No  Known Allergies    Outpatient Encounter Medications as of 10/02/2023  Medication Sig   lovastatin  (MEVACOR ) 10 MG tablet TAKE 1 TABLET BY MOUTH AT BEDTIME   PREVIDENT 5000 BOOSTER PLUS 1.1 % PSTE 3 (three) times daily.   VITAMIN D PO Take 500 mg by mouth daily.    doxycycline  (VIBRA -TABS) 100 MG tablet Take 1 tablet (100 mg total) by mouth 2 (two) times daily. (Patient not taking: Reported on 10/02/2023)   meloxicam  (MOBIC ) 7.5 MG tablet Take 1 tablet (7.5 mg total) by mouth daily. (Patient not taking: Reported on 10/02/2023)   No facility-administered encounter medications on file as of 10/02/2023.    REVIEW OF SYSTEMS  : All other systems reviewed and negative except where noted in the History of Present Illness.   PHYSICAL EXAM: BP 138/70 (BP Location: Left Arm, Patient Position: Sitting, Cuff Size: Normal)   Pulse 90   Ht 6' 1 (1.854 m)   Wt 159 lb 2 oz (72.2 kg)   BMI 20.99 kg/m  General: Well developed AA male in no acute distress Head: Normocephalic and atraumatic Eyes:  Sclerae anicteric, conjunctiva pink. Ears: Normal auditory acuity  Lungs: Clear throughout to auscultation; no W/R/R. Heart: Regular rate and rhythm; no M/R/G. Rectal:  Will be done at the time of colonoscopy. Musculoskeletal: Symmetrical with no gross deformities  Skin: No lesions on visible extremities Extremities: No edema. Neurological: Alert oriented x 4, grossly non-focal Psychological:  Alert and cooperative. Normal mood and  affect  ASSESSMENT AND PLAN: *Personal history of colon polyps:  Had one 2 mm adenoma removed in 10/2016.  Due for recall/surveillance colonoscopy.  Will schedule with Dr. Dominic Friendly.  The risks, benefits, and alternatives to colonoscopy were discussed with the patient and he consents to proceed.  CC:  [place], Betty G, MD

## 2023-10-03 NOTE — Progress Notes (Signed)
 ____________________________________________________________  Attending physician addendum:  Thank you for sending this case to me. I have reviewed the entire note and agree with the plan.   Amada Jupiter, MD  ____________________________________________________________

## 2023-10-23 ENCOUNTER — Encounter: Payer: Self-pay | Admitting: Gastroenterology

## 2023-10-27 ENCOUNTER — Telehealth: Payer: Self-pay | Admitting: Gastroenterology

## 2023-10-27 NOTE — Telephone Encounter (Signed)
 Reviewed instructions with patient, he verbalized understanding and agreed with the plan

## 2023-10-27 NOTE — Telephone Encounter (Signed)
 Patient requesting f/u call to discuss prep instructions.

## 2023-10-30 ENCOUNTER — Ambulatory Visit: Admitting: Gastroenterology

## 2023-10-30 ENCOUNTER — Encounter: Payer: Self-pay | Admitting: Gastroenterology

## 2023-10-30 VITALS — BP 100/60 | HR 63 | Temp 97.7°F | Resp 15 | Ht 73.0 in | Wt 159.0 lb

## 2023-10-30 DIAGNOSIS — Q438 Other specified congenital malformations of intestine: Secondary | ICD-10-CM

## 2023-10-30 DIAGNOSIS — Z1211 Encounter for screening for malignant neoplasm of colon: Secondary | ICD-10-CM | POA: Diagnosis not present

## 2023-10-30 DIAGNOSIS — Z860101 Personal history of adenomatous and serrated colon polyps: Secondary | ICD-10-CM

## 2023-10-30 MED ORDER — SODIUM CHLORIDE 0.9 % IV SOLN: 500.0000 mL | Freq: Once | INTRAVENOUS | Status: DC

## 2023-10-30 NOTE — Op Note (Signed)
 [facility] Patient Name: Richard Middleton Procedure Date: 10/30/2023 3:11 PM MRN: [id number] Endoscopist: Victory L. Legrand , MD, [id number] Age: 78 Referring MD:  Date of Birth: 11-06-1945 Gender: Male Account #: [id number] Procedure:                Colonoscopy Indications:              Surveillance: Personal history of adenomatous                            polyps on last colonoscopy > 5 years ago                           Diminutive tubular adenoma July 2018 Medicines:                Monitored Anesthesia Care Procedure:                Pre-Anesthesia Assessment:                           - Prior to the procedure, a History and Physical                            was performed, and patient medications and                            allergies were reviewed. The patient's tolerance of                            previous anesthesia was also reviewed. The risks                            and benefits of the procedure and the sedation                            options and risks were discussed with the patient.                            All questions were answered, and informed consent                            was obtained. Prior Anticoagulants: The patient has                            taken no anticoagulant or antiplatelet agents. ASA                            Grade Assessment: II - A patient with mild systemic                            disease. After reviewing the risks and benefits,                            the patient was deemed in satisfactory condition to  undergo the procedure.                           After obtaining informed consent, the colonoscope                            was passed under direct vision. Throughout the                            procedure, the patient's blood pressure, pulse, and                            oxygen saturations were monitored continuously. The                            CF [id number] #[id number] was introduced  through the anus                            and advanced to the the cecum, identified by                            appendiceal orifice and ileocecal valve. The                            colonoscopy was somewhat difficult due to a                            redundant colon. Successful completion of the                            procedure was aided by changing the patient to a                            semi-prone position, using manual pressure and                            straightening and shortening the scope to obtain                            bowel loop reduction. The patient tolerated the                            procedure well. The quality of the bowel                            preparation was good. The ileocecal valve,                            appendiceal orifice, and rectum were photographed. Scope In: 3:29:54 PM Scope Out: 3:44:54 PM Scope Withdrawal Time: 0 hours 8 minutes 17 seconds  Total Procedure Duration: 0 hours 15 minutes 0 seconds  Findings:                 The perianal and digital rectal examinations were  normal.                           Repeat examination of right colon under NBI                            performed.                           The entire examined colon appeared normal on direct                            and retroflexion views. Complications:            No immediate complications. Estimated Blood Loss:     Estimated blood loss: none. Impression:               - The entire examined colon is normal on direct and                            retroflexion views.                           - No specimens collected. Recommendation:           - Patient has a contact number available for                            emergencies. The signs and symptoms of potential                            delayed complications were discussed with the                            patient. Return to normal activities tomorrow.                             Written discharge instructions were provided to the                            patient.                           - Resume previous diet.                           - Continue present medications.                           - No repeat routine screening colonoscopy due to                            age, current guidelines and low risk findings today. Allyiah Gartner L. Legrand, MD 10/30/2023 3:48:15 PM This report has been signed electronically.

## 2023-10-30 NOTE — Progress Notes (Signed)
 Report given to PACU, vss

## 2023-10-30 NOTE — Patient Instructions (Signed)
 Please read handouts provided. Continue present medications. Resume previous diet.   YOU HAD AN ENDOSCOPIC PROCEDURE TODAY AT THE [facility]:   Refer to the procedure report that was given to you for any specific questions about what was found during the examination.  If the procedure report does not answer your questions, please call your gastroenterologist to clarify.  If you requested that your care partner not be given the details of your procedure findings, then the procedure report has been included in a sealed envelope for you to review at your convenience later.  YOU SHOULD EXPECT: Some feelings of bloating in the abdomen. Passage of more gas than usual.  Walking can help get rid of the air that was put into your GI tract during the procedure and reduce the bloating. If you had a lower endoscopy (such as a colonoscopy or flexible sigmoidoscopy) you may notice spotting of blood in your stool or on the toilet paper. If you underwent a bowel prep for your procedure, you may not have a normal bowel movement for a few days.  Please Note:  You might notice some irritation and congestion in your nose or some drainage.  This is from the oxygen used during your procedure.  There is no need for concern and it should clear up in a day or so.  SYMPTOMS TO REPORT IMMEDIATELY:  Following lower endoscopy (colonoscopy or flexible sigmoidoscopy):  Excessive amounts of blood in the stool  Significant tenderness or worsening of abdominal pains  Swelling of the abdomen that is new, acute  Fever of 100F or higher.  For urgent or emergent issues, a gastroenterologist can be reached at any hour by calling [phone]. Do not use MyChart messaging for urgent concerns.    DIET:  We do recommend a small meal at first, but then you may proceed to your regular diet.  Drink plenty of fluids but you should avoid alcoholic beverages for 24 hours.  ACTIVITY:  You should plan to take it easy for  the rest of today and you should NOT DRIVE or use heavy machinery until tomorrow (because of the sedation medicines used during the test).    FOLLOW UP: Our staff will call the number listed on your records the next business day following your procedure.  We will call around 7:15- 8:00 am to check on you and address any questions or concerns that you may have regarding the information given to you following your procedure. If we do not reach you, we will leave a message.     If any biopsies were taken you will be contacted by phone or by letter within the next 1-3 weeks.  Please call us at [phone] if you have not heard about the biopsies in 3 weeks.    SIGNATURES/CONFIDENTIALITY: You and/or your care partner have signed paperwork which will be entered into your electronic medical record.  These signatures attest to the fact that that the information above on your After Visit Summary has been reviewed and is understood.  Full responsibility of the confidentiality of this discharge information lies with you and/or your care-partner.

## 2023-10-30 NOTE — Progress Notes (Signed)
 Pt's states no medical or surgical changes since previsit or office visit.

## 2023-10-30 NOTE — Progress Notes (Signed)
 No significant changes to clinical history since GI office visit on 10/02/23.  The patient is appropriate for an endoscopic procedure in the ambulatory setting.  - Victory Brand, MD

## 2023-10-31 ENCOUNTER — Telehealth: Payer: Self-pay | Admitting: *Deleted

## 2023-10-31 NOTE — Telephone Encounter (Signed)
  Follow up Call-     10/30/2023    2:21 PM  Call back number  Post procedure Call Back phone  # [phone]  Permission to leave phone message Yes     Patient questions:  Do you have a fever, pain , or abdominal swelling? No. Pain Score  0 *  Have you tolerated food without any problems? Yes.    Have you been able to return to your normal activities? Yes.    Do you have any questions about your discharge instructions: Diet   No. Medications  No. Follow up visit  No.  Do you have questions or concerns about your Care? No.  Actions: * If pain score is 4 or above: No action needed, pain <4.

## 2023-11-18 DIAGNOSIS — M9903 Segmental and somatic dysfunction of lumbar region: Secondary | ICD-10-CM | POA: Diagnosis not present

## 2023-11-18 DIAGNOSIS — M51361 Other intervertebral disc degeneration, lumbar region with lower extremity pain only: Secondary | ICD-10-CM | POA: Diagnosis not present

## 2023-11-18 DIAGNOSIS — M9905 Segmental and somatic dysfunction of pelvic region: Secondary | ICD-10-CM | POA: Diagnosis not present

## 2023-11-18 DIAGNOSIS — M9904 Segmental and somatic dysfunction of sacral region: Secondary | ICD-10-CM | POA: Diagnosis not present

## 2023-11-20 DIAGNOSIS — M9903 Segmental and somatic dysfunction of lumbar region: Secondary | ICD-10-CM | POA: Diagnosis not present

## 2023-11-20 DIAGNOSIS — M51361 Other intervertebral disc degeneration, lumbar region with lower extremity pain only: Secondary | ICD-10-CM | POA: Diagnosis not present

## 2023-11-20 DIAGNOSIS — M9904 Segmental and somatic dysfunction of sacral region: Secondary | ICD-10-CM | POA: Diagnosis not present

## 2023-11-20 DIAGNOSIS — M9905 Segmental and somatic dysfunction of pelvic region: Secondary | ICD-10-CM | POA: Diagnosis not present

## 2023-11-24 DIAGNOSIS — M51361 Other intervertebral disc degeneration, lumbar region with lower extremity pain only: Secondary | ICD-10-CM | POA: Diagnosis not present

## 2023-11-24 DIAGNOSIS — M9903 Segmental and somatic dysfunction of lumbar region: Secondary | ICD-10-CM | POA: Diagnosis not present

## 2023-11-24 DIAGNOSIS — M9904 Segmental and somatic dysfunction of sacral region: Secondary | ICD-10-CM | POA: Diagnosis not present

## 2023-11-24 DIAGNOSIS — M9905 Segmental and somatic dysfunction of pelvic region: Secondary | ICD-10-CM | POA: Diagnosis not present

## 2023-11-27 DIAGNOSIS — M51361 Other intervertebral disc degeneration, lumbar region with lower extremity pain only: Secondary | ICD-10-CM | POA: Diagnosis not present

## 2023-11-27 DIAGNOSIS — M9905 Segmental and somatic dysfunction of pelvic region: Secondary | ICD-10-CM | POA: Diagnosis not present

## 2023-11-27 DIAGNOSIS — M9903 Segmental and somatic dysfunction of lumbar region: Secondary | ICD-10-CM | POA: Diagnosis not present

## 2023-11-27 DIAGNOSIS — M9904 Segmental and somatic dysfunction of sacral region: Secondary | ICD-10-CM | POA: Diagnosis not present

## 2023-12-01 DIAGNOSIS — M9903 Segmental and somatic dysfunction of lumbar region: Secondary | ICD-10-CM | POA: Diagnosis not present

## 2023-12-01 DIAGNOSIS — M9905 Segmental and somatic dysfunction of pelvic region: Secondary | ICD-10-CM | POA: Diagnosis not present

## 2023-12-01 DIAGNOSIS — M51361 Other intervertebral disc degeneration, lumbar region with lower extremity pain only: Secondary | ICD-10-CM | POA: Diagnosis not present

## 2023-12-01 DIAGNOSIS — M9904 Segmental and somatic dysfunction of sacral region: Secondary | ICD-10-CM | POA: Diagnosis not present

## 2023-12-04 DIAGNOSIS — M51361 Other intervertebral disc degeneration, lumbar region with lower extremity pain only: Secondary | ICD-10-CM | POA: Diagnosis not present

## 2023-12-04 DIAGNOSIS — M9903 Segmental and somatic dysfunction of lumbar region: Secondary | ICD-10-CM | POA: Diagnosis not present

## 2023-12-04 DIAGNOSIS — M9904 Segmental and somatic dysfunction of sacral region: Secondary | ICD-10-CM | POA: Diagnosis not present

## 2023-12-04 DIAGNOSIS — M9905 Segmental and somatic dysfunction of pelvic region: Secondary | ICD-10-CM | POA: Diagnosis not present

## 2023-12-08 DIAGNOSIS — M9903 Segmental and somatic dysfunction of lumbar region: Secondary | ICD-10-CM | POA: Diagnosis not present

## 2023-12-08 DIAGNOSIS — M9905 Segmental and somatic dysfunction of pelvic region: Secondary | ICD-10-CM | POA: Diagnosis not present

## 2023-12-08 DIAGNOSIS — M51361 Other intervertebral disc degeneration, lumbar region with lower extremity pain only: Secondary | ICD-10-CM | POA: Diagnosis not present

## 2023-12-08 DIAGNOSIS — M9904 Segmental and somatic dysfunction of sacral region: Secondary | ICD-10-CM | POA: Diagnosis not present

## 2023-12-16 DIAGNOSIS — M9904 Segmental and somatic dysfunction of sacral region: Secondary | ICD-10-CM | POA: Diagnosis not present

## 2023-12-16 DIAGNOSIS — M9903 Segmental and somatic dysfunction of lumbar region: Secondary | ICD-10-CM | POA: Diagnosis not present

## 2023-12-16 DIAGNOSIS — M9905 Segmental and somatic dysfunction of pelvic region: Secondary | ICD-10-CM | POA: Diagnosis not present

## 2023-12-16 DIAGNOSIS — M51361 Other intervertebral disc degeneration, lumbar region with lower extremity pain only: Secondary | ICD-10-CM | POA: Diagnosis not present

## 2023-12-22 DIAGNOSIS — M9904 Segmental and somatic dysfunction of sacral region: Secondary | ICD-10-CM | POA: Diagnosis not present

## 2023-12-22 DIAGNOSIS — M51361 Other intervertebral disc degeneration, lumbar region with lower extremity pain only: Secondary | ICD-10-CM | POA: Diagnosis not present

## 2023-12-22 DIAGNOSIS — M9905 Segmental and somatic dysfunction of pelvic region: Secondary | ICD-10-CM | POA: Diagnosis not present

## 2023-12-22 DIAGNOSIS — M9903 Segmental and somatic dysfunction of lumbar region: Secondary | ICD-10-CM | POA: Diagnosis not present

## 2023-12-28 NOTE — Progress Notes (Unsigned)
 HPI:  [name] is a 78 y.o.male here today for his routine physical examination.  Last CPE: *** Discussed the use of AI scribe software for clinical note transcription with the patient, who gave verbal consent to proceed.  History of Present Illness Richard Middleton is a 78 year old male who presents for an annual physical exam.  He last had a physical in September 2025. Since then, he has consulted with other physicians for a tick bite and joint pain. He maintains an active lifestyle, aiming for 10,000 steps daily, and follows a healthy diet with daily vegetable intake. He sleeps 7-8 hours per night and abstains from alcohol. He quit smoking in 1972.  He underwent a colonoscopy in July 2025 and has annual prostate cancer screenings. He experiences nocturia, waking 1-2 times per night, but notes no changes in urinary frequency.  He is currently on lovastatin  10 mg daily for cholesterol management and takes vitamin D 10,000 IU daily. He has been receiving chiropractic care for lower back pain, which has shown improvement with weekly sessions. He has a history of back issues and is cautious with physical activities.  He is concerned about his blood pressure. He does not monitor his blood pressure at home and denies increased salt intake, attributing the rise to possible stress.  He has a history of a fullness in the supraclavicular area, noted in September 2023, with a negative ultrasound. He experiences occasional cough indoors, which he attributes to seasonal changes. No cough, difficulty breathing, or wheezing.   Regular exercise 3 or more times per week: *** Following a healthy diet: ***  Chronic medical problems: ***  Immunization History  Administered Date(s) Administered   Fluad Quad(high Dose 65+) 02/10/2020, 12/22/2020   Fluad Trivalent(High Dose 65+) 12/27/2022   INFLUENZA, HIGH DOSE SEASONAL PF 12/22/2015, 01/19/2018   Influenza Split 01/24/2012   Influenza Whole  01/12/2007, 01/05/2008, 02/01/2009, 01/09/2010   Influenza,inj,Quad PF,6+ Mos 01/11/2013   Influenza-Unspecified 01/19/2018, 01/20/2019   PFIZER(Purple Top)SARS-COV-2 Vaccination 05/20/2019, 06/10/2019, 01/16/2021   Pneumococcal Conjugate-13 08/18/2015   Pneumococcal Polysaccharide-23 01/24/2012, 08/27/2017, 02/10/2020   Td 08/01/2006   Unspecified SARS-COV-2 Vaccination 12/15/2022   Zoster Recombinant(Shingrix ) 01/20/2019, 01/20/2019   Zoster, Live 07/21/2007    Health Maintenance  Topic Date Due   Influenza Vaccine  11/14/2023   COVID-19 Vaccine (5 - Pfizer risk 2024-25 season) 12/15/2023   Medicare Annual Wellness (AWV)  05/13/2024   Pneumococcal Vaccine: 50+ Years  Completed   Hepatitis C Screening  Completed   HPV VACCINES  Aged Out   Meningococcal B Vaccine  Aged Out   DTaP/Tdap/Td  Discontinued   Colonoscopy  Discontinued   Zoster Vaccines- Shingrix   Discontinued    Last prostate ca screening: *** Lab Results  Component Value Date   PSA 0.97 09/23/2022   PSA 0.60 12/25/2021   PSA 0.41 12/22/2020   Lab Results  Component Value Date   CHOL 110 12/27/2022   HDL 61.60 12/27/2022   LDLCALC 39 12/27/2022   TRIG 49.0 12/27/2022   CHOLHDL 2 12/27/2022   Lab Results  Component Value Date   HGBA1C 5.9 12/27/2022   -Concerns and/or follow up today: ***  Review of Systems  Current Outpatient Medications on File Prior to Visit  Medication Sig Dispense Refill   lovastatin  (MEVACOR ) 10 MG tablet TAKE 1 TABLET BY MOUTH AT BEDTIME 90 tablet 2   PREVIDENT 5000 BOOSTER PLUS 1.1 % PSTE 3 (three) times daily.     VITAMIN D  PO Take 500 mg by mouth daily.      No current facility-administered medications on file prior to visit.    Past Medical History:  Diagnosis Date   Abnormal glucose    History of small bowel obstruction    x2   Pneumonia    as a child    Past Surgical History:  Procedure Laterality Date   ACHILLES TENDON SURGERY Right 05/02/2017   Procedure:  RIGHT ACHILLES RECONSTRUCTION;  Surgeon: Richard Richard GAILS, MD;  Location: [facility] OR;  Service: Orthopedics;  Laterality: Right;   COLONOSCOPY  2008   LAPAROSCOPIC SMALL BOWEL RESECTION  3 1/2 years ago, & 10 years ago   x2   LAPAROTOMY N/A 07/13/2014   Procedure: EXPLORATORY LAPAROTOMY lysis of adhesions;  Surgeon: Richard Ned, MD;  Location: [facility] ORS;  Service: General;  Laterality: N/A;   LUMBAR LAMINECTOMY/DECOMPRESSION MICRODISCECTOMY N/A 05/18/2018   Procedure: L4-5 DECOMPRESSION, RIGHT MICRODISCECTOMY;  Surgeon: Richard Richard BROCKS, MD;  Location: [facility] OR;  Service: Orthopedics;  Laterality: N/A;    No Known Allergies  Family History  Problem Relation Age of Onset   Arthritis Mother    Pulmonary embolism Father    Cancer Neg Hx    Diabetes Neg Hx    Colon cancer Neg Hx     Social History   Socioeconomic History   Marital status: Married    Spouse name: Not on file   Number of children: 2   Years of education: 6   Highest education level: Associate degree: occupational, Scientist, product/process development, or vocational program  Occupational History   Occupation: retired  Tobacco Use   Smoking status: Former    Current packs/day: 0.00    Average packs/day: 4.0 packs/day for 4.0 years (16.0 ttl pk-yrs)    Types: Cigarettes    Start date: 04/15/1966    Quit date: 04/15/1970    Years since quitting: 53.7   Smokeless tobacco: Never  Vaping Use   Vaping status: Never Used  Substance and Sexual Activity   Alcohol use: Yes    Alcohol/week: 1.0 standard drink of alcohol    Types: 1 Glasses of wine per week    Comment: 2-3 times per month    Drug use: No   Sexual activity: Yes  Other Topics Concern   Not on file  Social History Narrative   Not on file   Social Drivers of Health   Financial Resource Strain: Low Risk  (05/14/2023)   Overall Financial Resource Strain (CARDIA)    Difficulty of Paying Living Expenses: Not hard at all  Food Insecurity: No Food Insecurity (05/14/2023)   Hunger Vital Sign    Worried  About Running Out of Food in the Last Year: Never true    Ran Out of Food in the Last Year: Never true  Transportation Needs: No Transportation Needs (05/14/2023)   PRAPARE - Administrator, Civil Service (Medical): No    Lack of Transportation (Non-Medical): No  Physical Activity: Sufficiently Active (05/14/2023)   Exercise Vital Sign    Days of Exercise per Week: 7 days    Minutes of Exercise per Session: 60 min  Stress: No Stress Concern Present (05/14/2023)   [employer] of Occupational Health - Occupational Stress Questionnaire    Feeling of Stress : Not at all  Social Connections: Socially Integrated (05/14/2023)   Social Connection and Isolation Panel    Frequency of Communication with Friends and Family: More than three times a week    Frequency  of Social Gatherings with Friends and Family: More than three times a week    Attends Religious Services: More than 4 times per year    Active Member of Clubs or Organizations: Yes    Attends Banker Meetings: More than 4 times per year    Marital Status: Married    Vitals:   12/29/23 0916 12/29/23 0918  BP: (!) 142/88 136/80  Pulse: 77   Temp: (!) 97.3 F (36.3 C)   SpO2: 98%    Body mass index is 23.01 kg/m.  Wt Readings from Last 3 Encounters:  12/29/23 160 lb 3.2 oz (72.7 kg)  10/30/23 159 lb (72.1 kg)  10/02/23 159 lb 2 oz (72.2 kg)    Physical Exam Vitals and nursing note reviewed.  Constitutional:      General: He is not in acute distress.    Appearance: He is well-developed.  HENT:     Head: Normocephalic and atraumatic.     Right Ear: Tympanic membrane, ear canal and external ear normal.     Left Ear: Tympanic membrane, ear canal and external ear normal.     Mouth/Throat:     Mouth: Mucous membranes are moist.     Pharynx: Oropharynx is clear.  Eyes:     Conjunctiva/sclera: Conjunctivae normal.     Pupils: Pupils are equal, round, and reactive to light.  Neck:     Thyroid : No  thyroid  mass.  Cardiovascular:     Rate and Rhythm: Normal rate and regular rhythm.     Pulses:          Dorsalis pedis pulses are 2+ on the right side and 2+ on the left side.     Heart sounds: No murmur heard. Pulmonary:     Effort: Pulmonary effort is normal. No respiratory distress.     Breath sounds: Normal breath sounds.  Abdominal:     Palpations: Abdomen is soft. There is no hepatomegaly or mass.     Tenderness: There is no abdominal tenderness.  Genitourinary:    Comments: No concerns. Musculoskeletal:        General: No tenderness.     Cervical back: Normal range of motion.     Comments: No major deformities appreciated and no signs of synovitis.  Lymphadenopathy:     Cervical: No cervical adenopathy.     Comments: Left supraclavicular fullness, stable.  Skin:    General: Skin is warm.     Findings: No erythema.  Neurological:     General: No focal deficit present.     Mental Status: He is alert and oriented to person, place, and time.     Cranial Nerves: No cranial nerve deficit.     Sensory: No sensory deficit.     Gait: Gait normal.     Deep Tendon Reflexes:     Reflex Scores:      Bicep reflexes are 2+ on the right side and 2+ on the left side.      Patellar reflexes are 2+ on the right side and 2+ on the left side. Psychiatric:        Mood and Affect: Mood and affect normal.    ASSESSMENT AND PLAN:  Adith was seen today for annual exam.  Diagnoses and all orders for this visit:  Routine general medical examination at a health care facility  Prediabetes -     Comprehensive metabolic panel with GFR; Future -     Hemoglobin A1c; Future  Atherosclerosis of aorta (  HCC) -     Comprehensive metabolic panel with GFR; Future -     Lipid panel; Future  BPH associated with nocturia -     Comprehensive metabolic panel with GFR; Future -     PSA; Future  Influenza vaccine needed -     Flu vaccine HIGH DOSE PF(Fluzone Trivalent)  Need for COVID-19  vaccine -     Discontinue: COVID-19 mRNA vaccine, Pfizer, (COMIRNATY) syringe; Inject 0.3 mLs into the muscle once for 1 dose. -     COVID-19 mRNA vaccine, Pfizer, (COMIRNATY) syringe; Inject 0.3 mLs into the muscle once for 1 dose.  Chronic bilateral low back pain without sciatica -     Comprehensive metabolic panel with GFR; Future -     CBC; Future  Elevated blood pressure reading   Orders Placed This Encounter  Procedures   Flu vaccine HIGH DOSE PF(Fluzone Trivalent)   Comprehensive metabolic panel with GFR   Lipid panel   PSA   Hemoglobin A1c   CBC    Routine general medical examination at a health care facility  Prediabetes -     Comprehensive metabolic panel with GFR; Future -     Hemoglobin A1c; Future  Atherosclerosis of aorta (HCC) -     Comprehensive metabolic panel with GFR; Future -     Lipid panel; Future  BPH associated with nocturia -     Comprehensive metabolic panel with GFR; Future -     PSA; Future  Influenza vaccine needed -     Flu vaccine HIGH DOSE PF(Fluzone Trivalent)  Need for COVID-19 vaccine -     COVID-19 mRNA Vac-TriS(Pfizer); Inject 0.3 mLs into the muscle once for 1 dose.  Dispense: 0.3 mL; Refill: 0  Chronic bilateral low back pain without sciatica -     Comprehensive metabolic panel with GFR; Future -     CBC; Future  Elevated blood pressure reading    Return in about 1 year (around 12/28/2024) for CPE.  Reace Breshears G. [place], MD  [facility] [place] [facility].

## 2023-12-29 ENCOUNTER — Ambulatory Visit: Admitting: Family Medicine

## 2023-12-29 VITALS — BP 136/80 | HR 77 | Temp 97.3°F | Resp 16 | Ht 69.96 in | Wt 160.2 lb

## 2023-12-29 DIAGNOSIS — R351 Nocturia: Secondary | ICD-10-CM

## 2023-12-29 DIAGNOSIS — Z23 Encounter for immunization: Secondary | ICD-10-CM | POA: Diagnosis not present

## 2023-12-29 DIAGNOSIS — M545 Low back pain, unspecified: Secondary | ICD-10-CM

## 2023-12-29 DIAGNOSIS — R030 Elevated blood-pressure reading, without diagnosis of hypertension: Secondary | ICD-10-CM

## 2023-12-29 DIAGNOSIS — R7303 Prediabetes: Secondary | ICD-10-CM

## 2023-12-29 DIAGNOSIS — N401 Enlarged prostate with lower urinary tract symptoms: Secondary | ICD-10-CM

## 2023-12-29 DIAGNOSIS — G8929 Other chronic pain: Secondary | ICD-10-CM

## 2023-12-29 DIAGNOSIS — Z0000 Encounter for general adult medical examination without abnormal findings: Secondary | ICD-10-CM

## 2023-12-29 DIAGNOSIS — I700 Atherosclerosis of aorta: Secondary | ICD-10-CM

## 2023-12-29 LAB — CBC
HCT: 42.6 % (ref 39.0–52.0)
Hemoglobin: 13.8 g/dL (ref 13.0–17.0)
MCHC: 32.4 g/dL (ref 30.0–36.0)
MCV: 84.6 fl (ref 78.0–100.0)
Platelets: 183 K/uL (ref 150.0–400.0)
RBC: 5.04 Mil/uL (ref 4.22–5.81)
RDW: 13.9 % (ref 11.5–15.5)
WBC: 4.3 K/uL (ref 4.0–10.5)

## 2023-12-29 LAB — LIPID PANEL
Cholesterol: 131 mg/dL (ref 0–200)
HDL: 65.8 mg/dL (ref >39.00)
LDL Cholesterol: 56 mg/dL (ref 0–99)
NonHDL: 64.95
Total CHOL/HDL Ratio: 2
Triglycerides: 43 mg/dL (ref 0.0–149.0)
VLDL: 8.6 mg/dL (ref 0.0–40.0)

## 2023-12-29 LAB — COMPREHENSIVE METABOLIC PANEL WITH GFR
ALT: 16 U/L (ref 0–53)
AST: 20 U/L (ref 0–37)
Albumin: 4.1 g/dL (ref 3.5–5.2)
Alkaline Phosphatase: 111 U/L (ref 39–117)
BUN: 16 mg/dL (ref 6–23)
CO2: 29 meq/L (ref 19–32)
Calcium: 9.7 mg/dL (ref 8.4–10.5)
Chloride: 104 meq/L (ref 96–112)
Creatinine, Ser: 0.98 mg/dL (ref 0.40–1.50)
GFR: 74.47 mL/min (ref >60.00)
Glucose, Bld: 55 mg/dL — ABNORMAL LOW (ref 70–99)
Potassium: 4 meq/L (ref 3.5–5.1)
Sodium: 140 meq/L (ref 135–145)
Total Bilirubin: 0.5 mg/dL (ref 0.2–1.2)
Total Protein: 6.8 g/dL (ref 6.0–8.3)

## 2023-12-29 LAB — PSA: PSA: 1.18 ng/mL (ref 0.10–4.00)

## 2023-12-29 LAB — HEMOGLOBIN A1C: Hgb A1c MFr Bld: 6.3 % (ref 4.6–6.5)

## 2023-12-29 MED ORDER — COVID-19 MRNA VAC-TRIS(PFIZER) 30 MCG/0.3ML IM SUSY: 0.3000 mL | PREFILLED_SYRINGE | Freq: Once | INTRAMUSCULAR | 0 refills | Status: DC

## 2023-12-29 MED ORDER — COVID-19 MRNA VAC-TRIS(PFIZER) 30 MCG/0.3ML IM SUSY: 0.3000 mL | PREFILLED_SYRINGE | Freq: Once | INTRAMUSCULAR | 0 refills | Status: AC

## 2023-12-29 NOTE — Patient Instructions (Addendum)
 A few things to remember from today's visit:  Routine general medical examination at a health care facility  Prediabetes - Plan: Comprehensive metabolic panel with GFR, Hemoglobin A1c  Atherosclerosis of aorta (HCC) - Plan: Comprehensive metabolic panel with GFR, Lipid panel  BPH associated with nocturia - Plan: Comprehensive metabolic panel with GFR, PSA  Influenza vaccine needed - Plan: Flu vaccine HIGH DOSE PF(Fluzone Trivalent)  Need for COVID-19 vaccine - Plan: COVID-19 mRNA vaccine, Pfizer, (COMIRNATY) syringe, DISCONTINUED: COVID-19 mRNA vaccine, Pfizer, (COMIRNATY) syringe  Chronic bilateral low back pain without sciatica - Plan: Comprehensive metabolic panel with GFR, CBC  Elevated blood pressure reading  Monitor blood pressure at home and let me know about readings in 3-4 weeks.  If you need refills for medications you take chronically, please call your pharmacy. Do not use My Chart to request refills or for acute issues that need immediate attention. If you send a my chart message, it may take a few days to be addressed, specially if I am not in the office.  Please be sure medication list is accurate. If a new problem present, please set up appointment sooner than planned today.

## 2023-12-30 ENCOUNTER — Telehealth: Payer: Self-pay | Admitting: Family Medicine

## 2023-12-30 NOTE — Telephone Encounter (Signed)
 Copied from CRM [id number]. Topic: Clinical - Lab/Test Results >> Dec 30, 2023 12:52 PM Shereese L wrote: Reason for RMF:Ejupzwu would like a call back in reference to his lab result

## 2023-12-31 ENCOUNTER — Ambulatory Visit: Payer: Self-pay | Admitting: Family Medicine

## 2024-01-01 ENCOUNTER — Encounter: Payer: Self-pay | Admitting: Family Medicine

## 2024-01-01 DIAGNOSIS — M9905 Segmental and somatic dysfunction of pelvic region: Secondary | ICD-10-CM | POA: Diagnosis not present

## 2024-01-01 DIAGNOSIS — M9904 Segmental and somatic dysfunction of sacral region: Secondary | ICD-10-CM | POA: Diagnosis not present

## 2024-01-01 DIAGNOSIS — M9903 Segmental and somatic dysfunction of lumbar region: Secondary | ICD-10-CM | POA: Diagnosis not present

## 2024-01-01 DIAGNOSIS — M51361 Other intervertebral disc degeneration, lumbar region with lower extremity pain only: Secondary | ICD-10-CM | POA: Diagnosis not present

## 2024-01-01 NOTE — Telephone Encounter (Signed)
 Message through Mychart has been sent. BJ

## 2024-01-01 NOTE — Assessment & Plan Note (Signed)
 Stable. He would like PSA done today.

## 2024-01-01 NOTE — Assessment & Plan Note (Signed)
We discussed the importance of regular physical activity and healthy diet for prevention of chronic illness and/or complications. Preventive guidelines reviewed. Vaccination up to date. Next CPE in a year. 

## 2024-01-01 NOTE — Assessment & Plan Note (Signed)
 Problem has improved with recent treatments at his chiropractor's office. Continue following with chiropractor. I do not think imaging is needed at this time.

## 2024-01-01 NOTE — Assessment & Plan Note (Signed)
 Hemoglobin A1c was 5.9 in 12/2022. Encouraged consistency with a healthy lifestyle for diabetes prevention. Further recommendation will be given according to hemoglobin A1c result.

## 2024-02-06 ENCOUNTER — Telehealth: Payer: Self-pay | Admitting: Family Medicine

## 2024-02-06 DIAGNOSIS — H40012 Open angle with borderline findings, low risk, left eye: Secondary | ICD-10-CM | POA: Diagnosis not present

## 2024-02-06 NOTE — Telephone Encounter (Signed)
 Pt dropped off blood pressure log. Paper placed in red folder

## 2024-02-11 NOTE — Telephone Encounter (Signed)
 All BP's taken from 01/04/24 to 01/31/24 are in normal range. Thanks, BJ

## 2024-02-16 ENCOUNTER — Encounter: Payer: Self-pay | Admitting: Radiology

## 2024-02-29 ENCOUNTER — Emergency Department (HOSPITAL_COMMUNITY)

## 2024-02-29 ENCOUNTER — Other Ambulatory Visit: Payer: Self-pay

## 2024-02-29 ENCOUNTER — Encounter (HOSPITAL_COMMUNITY): Payer: Self-pay | Admitting: Emergency Medicine

## 2024-02-29 ENCOUNTER — Emergency Department (HOSPITAL_COMMUNITY)
Admission: EM | Admit: 2024-02-29 | Discharge: 2024-02-29 | Disposition: A | Discharge status: Home / Self Care | Attending: Emergency Medicine | Admitting: Emergency Medicine

## 2024-02-29 DIAGNOSIS — Z87891 Personal history of nicotine dependence: Secondary | ICD-10-CM | POA: Insufficient documentation

## 2024-02-29 DIAGNOSIS — R531 Weakness: Secondary | ICD-10-CM | POA: Insufficient documentation

## 2024-02-29 DIAGNOSIS — R42 Dizziness and giddiness: Secondary | ICD-10-CM | POA: Insufficient documentation

## 2024-02-29 DIAGNOSIS — R110 Nausea: Secondary | ICD-10-CM | POA: Diagnosis not present

## 2024-02-29 LAB — CBC WITH DIFFERENTIAL/PLATELET
Abs Immature Granulocytes: 0.01 K/uL (ref 0.00–0.07)
Basophils Absolute: 0 K/uL (ref 0.0–0.1)
Basophils Relative: 0 %
Eosinophils Absolute: 0 K/uL (ref 0.0–0.5)
Eosinophils Relative: 1 %
HCT: 41.1 % (ref 39.0–52.0)
Hemoglobin: 12.9 g/dL — ABNORMAL LOW (ref 13.0–17.0)
Immature Granulocytes: 0 %
Lymphocytes Relative: 17 %
Lymphs Abs: 1.1 K/uL (ref 0.7–4.0)
MCH: 27 pg (ref 26.0–34.0)
MCHC: 31.4 g/dL (ref 30.0–36.0)
MCV: 86.2 fL (ref 80.0–100.0)
Monocytes Absolute: 0.3 K/uL (ref 0.1–1.0)
Monocytes Relative: 5 %
Neutro Abs: 5 K/uL (ref 1.7–7.7)
Neutrophils Relative %: 77 %
Platelets: 162 K/uL (ref 150–400)
RBC: 4.77 MIL/uL (ref 4.22–5.81)
RDW: 12.6 % (ref 11.5–15.5)
WBC: 6.4 K/uL (ref 4.0–10.5)
nRBC: 0 % (ref 0.0–0.2)

## 2024-02-29 LAB — COMPREHENSIVE METABOLIC PANEL WITH GFR
ALT: 21 U/L (ref 0–44)
AST: 26 U/L (ref 15–41)
Albumin: 3.3 g/dL — ABNORMAL LOW (ref 3.5–5.0)
Alkaline Phosphatase: 101 U/L (ref 38–126)
Anion gap: 10 (ref 5–15)
BUN: 15 mg/dL (ref 8–23)
CO2: 23 mmol/L (ref 22–32)
Calcium: 8.9 mg/dL (ref 8.9–10.3)
Chloride: 106 mmol/L (ref 98–111)
Creatinine, Ser: 0.92 mg/dL (ref 0.61–1.24)
GFR, Estimated: >60 mL/min (ref >60)
Glucose, Bld: 109 mg/dL — ABNORMAL HIGH (ref 70–99)
Potassium: 3.7 mmol/L (ref 3.5–5.1)
Sodium: 139 mmol/L (ref 135–145)
Total Bilirubin: 0.8 mg/dL (ref 0.0–1.2)
Total Protein: 6.3 g/dL — ABNORMAL LOW (ref 6.5–8.1)

## 2024-02-29 LAB — TROPONIN I (HIGH SENSITIVITY): Troponin I (High Sensitivity): 5 ng/L (ref <18)

## 2024-02-29 MED ORDER — MECLIZINE HCL 25 MG PO TABS: 25.0000 mg | ORAL_TABLET | Freq: Three times a day (TID) | ORAL | 0 refills | Status: AC | PRN

## 2024-02-29 MED ORDER — MECLIZINE HCL 25 MG PO TABS
25.0000 mg | ORAL_TABLET | Freq: Once | ORAL | Status: AC
  Administered 2024-02-29: 25 mg via ORAL
  Filled 2024-02-29: qty 1

## 2024-02-29 MED ORDER — LACTATED RINGERS IV BOLUS
1000.0000 mL | Freq: Once | INTRAVENOUS | Status: AC
  Administered 2024-02-29: 1000 mL via INTRAVENOUS

## 2024-02-29 MED ORDER — IOHEXOL 350 MG/ML SOLN
75.0000 mL | Freq: Once | INTRAVENOUS | Status: AC | PRN
  Administered 2024-02-29: 75 mL via INTRAVENOUS

## 2024-02-29 NOTE — ED Provider Notes (Signed)
 Pt signed out pending labs and CT scans.  CBC nl; CMP nl; Trop nl  CXR: No active cardiopulmonary disease.   CT head/CTA head/neck:  1. No large vessel occlusion, hemodynamically significant stenosis, or aneurysm  in the head or neck  2. Age-related atrophy and mild cerebral white matter disease   Pt is now feeling much better.  He walked around the nurse's station and feels back to normal.  He is offered a MRI, but declines since he does feel better.  He knows to return if worse.  F/u with pcp.  He's had vestibular rehab in the past and is referred back to [facility] as that's where he had it in the past.     Dean Clarity, MD 02/29/24 1143

## 2024-02-29 NOTE — ED Notes (Signed)
 Pt reports that jerking his head to either side will occasionally result in shooting pain in the side of his neck.

## 2024-02-29 NOTE — ED Notes (Signed)
 Pt ambulated without difficulty, no dizziness or lightheadedness

## 2024-02-29 NOTE — ED Triage Notes (Signed)
 Patient arrives via [facility] from home for dizziness and near syncope. Patient reports feeling dizzy and slid off the toilet. Denies LOC. Dizziness worsens with movement and is associated with nausea. No head trauma and denies blood thinners.   500 ml NS given PTA.

## 2024-03-01 ENCOUNTER — Ambulatory Visit: Admitting: Family Medicine

## 2024-03-01 ENCOUNTER — Encounter: Payer: Self-pay | Admitting: Family Medicine

## 2024-03-01 VITALS — BP 110/68 | HR 76 | Temp 97.9°F | Resp 16 | Ht 73.0 in | Wt 163.4 lb

## 2024-03-01 DIAGNOSIS — H8110 Benign paroxysmal vertigo, unspecified ear: Secondary | ICD-10-CM

## 2024-03-01 NOTE — Patient Instructions (Signed)
 A few things to remember from today's visit:  Benign paroxysmal positional vertigo, unspecified laterality No changes today. Fall precautions. Continue Meclizine . Pending PT.  If you need refills for medications you take chronically, please call your pharmacy. Do not use My Chart to request refills or for acute issues that need immediate attention. If you send a my chart message, it may take a few days to be addressed, specially if I am not in the office.  Please be sure medication list is accurate. If a new problem present, please set up appointment sooner than planned today.

## 2024-03-01 NOTE — Progress Notes (Signed)
 Chief Complaint  Patient presents with   Hospitalization Follow-up    Pt reports he is feeling better from ED discharge. Denied any new episode of vertigo today. Requesting a rehab for vertigo, trouble walking.    Discussed the use of AI scribe software for clinical note transcription with the patient, who gave verbal consent to proceed.  History of Present Illness Richard Middleton is a 77 year old male with past medical history significant for aortic atherosclerosis, prediabetes, and BPH, who is here today to follow on recent ED visit. He was evaluated in the ED yesterday for episode of dizziness, diagnosed with vertigo. Meclizine  25 mg 3 times daily was recommended.  He experienced a sudden onset of dizziness described as the room spinning at approximately 2:30 AM when he got up to use the restroom. The dizziness was severe enough that he slid off the toilet and onto the floor. He managed to get back to bed, but upon getting up again, the dizziness persisted.  He was evaluated in the emergency department where a CT scan, x-ray, and blood work were performed.   EKG showed sinus arrhythmia,  similar to previous ones.  No fever, sore throat, chest pain, difficulty breathing, palpitations, recent URI's, or recent trips to the mountains or flying.  He mentioned a history of vertigo that developed gradually in the past, unlike the sudden onset of this episode.  Head and neck CTA: 1. No large vessel occlusion, hemodynamically significant stenosis, or aneurysm  in the head or neck  2. Age-related atrophy and mild cerebral white matter disease   Chest x-ray: No active cardiopulmonary disease.  Troponin 5.  Lab Results  Component Value Date   NA 139 02/29/2024   CL 106 02/29/2024   K 3.7 02/29/2024   CO2 23 02/29/2024   BUN 15 02/29/2024   CREATININE 0.92 02/29/2024   GFRNONAA >60 02/29/2024   CALCIUM 8.9 02/29/2024   ALBUMIN 3.3 (L) 02/29/2024   GLUCOSE 109 (H) 02/29/2024   Lab  Results  Component Value Date   ALT 21 02/29/2024   AST 26 02/29/2024   ALKPHOS 101 02/29/2024   BILITOT 0.8 02/29/2024   Lab Results  Component Value Date   WBC 6.4 02/29/2024   HGB 12.9 (L) 02/29/2024   HCT 41.1 02/29/2024   MCV 86.2 02/29/2024   PLT 162 02/29/2024   Review of Systems  Constitutional:  Negative for appetite change, chills and fever.  HENT:  Negative for mouth sores.   Respiratory:  Negative for cough and wheezing.   Gastrointestinal:  Negative for abdominal pain, nausea and vomiting.  Endocrine: Negative for cold intolerance and heat intolerance.  Genitourinary:  Negative for decreased urine volume, dysuria and hematuria.  Skin:  Negative for rash.  Neurological:  Negative for syncope, facial asymmetry, weakness and headaches.  Psychiatric/Behavioral:  Negative for confusion and hallucinations.   See other pertinent positives and negatives in HPI.  Current Outpatient Medications on File Prior to Visit  Medication Sig Dispense Refill   lovastatin  (MEVACOR ) 10 MG tablet TAKE 1 TABLET BY MOUTH AT BEDTIME 90 tablet 2   meclizine  (ANTIVERT ) 25 MG tablet Take 1 tablet (25 mg total) by mouth 3 (three) times daily as needed for dizziness. 30 tablet 0   PREVIDENT 5000 BOOSTER PLUS 1.1 % PSTE 3 (three) times daily.     VITAMIN D PO Take 1 tablet by mouth daily.     No current facility-administered medications on file prior to visit.  Past Medical History:  Diagnosis Date   Abnormal glucose    History of small bowel obstruction    x2   Pneumonia    as a child   No Known Allergies  Social History   Socioeconomic History   Marital status: Married    Spouse name: Not on file   Number of children: 2   Years of education: 6   Highest education level: Associate degree: occupational, scientist, product/process development, or vocational program  Occupational History   Occupation: retired  Tobacco Use   Smoking status: Former    Current packs/day: 0.00    Average packs/day: 4.0  packs/day for 4.0 years (16.0 ttl pk-yrs)    Types: Cigarettes    Start date: 04/15/1966    Quit date: 04/15/1970    Years since quitting: 53.9   Smokeless tobacco: Never  Vaping Use   Vaping status: Never Used  Substance and Sexual Activity   Alcohol use: Yes    Alcohol/week: 1.0 standard drink of alcohol    Types: 1 Glasses of wine per week    Comment: 2-3 times per month    Drug use: No   Sexual activity: Yes  Other Topics Concern   Not on file  Social History Narrative   Not on file   Social Drivers of Health   Financial Resource Strain: Low Risk  (05/14/2023)   Overall Financial Resource Strain (CARDIA)    Difficulty of Paying Living Expenses: Not hard at all  Food Insecurity: No Food Insecurity (05/14/2023)   Hunger Vital Sign    Worried About Running Out of Food in the Last Year: Never true    Ran Out of Food in the Last Year: Never true  Transportation Needs: No Transportation Needs (05/14/2023)   PRAPARE - Administrator, Civil Service (Medical): No    Lack of Transportation (Non-Medical): No  Physical Activity: Sufficiently Active (05/14/2023)   Exercise Vital Sign    Days of Exercise per Week: 7 days    Minutes of Exercise per Session: 60 min  Stress: No Stress Concern Present (05/14/2023)   [employer] of Occupational Health - Occupational Stress Questionnaire    Feeling of Stress : Not at all  Social Connections: Socially Integrated (05/14/2023)   Social Connection and Isolation Panel    Frequency of Communication with Friends and Family: More than three times a week    Frequency of Social Gatherings with Friends and Family: More than three times a week    Attends Religious Services: More than 4 times per year    Active Member of [employer] or Organizations: Yes    Attends Banker Meetings: More than 4 times per year    Marital Status: Married    Vitals:   03/01/24 1401  BP: 110/68  Pulse: 76  Resp: 16  Temp: 97.9 F (36.6 C)  SpO2:  96%   Body mass index is 21.56 kg/m.  Physical Exam Vitals and nursing note reviewed.  Constitutional:      General: He is not in acute distress.    Appearance: He is well-developed. He is not ill-appearing.  HENT:     Head: Normocephalic and atraumatic.     Right Ear: Tympanic membrane, ear canal and external ear normal.     Left Ear: Tympanic membrane, ear canal and external ear normal.     Mouth/Throat:     Mouth: Mucous membranes are moist.     Pharynx: Oropharynx is clear.  Eyes:  Conjunctiva/sclera: Conjunctivae normal.  Cardiovascular:     Rate and Rhythm: Normal rate and regular rhythm.     Heart sounds: No murmur heard. Pulmonary:     Effort: Pulmonary effort is normal. No respiratory distress.     Breath sounds: Normal breath sounds. No stridor.  Lymphadenopathy:     Cervical: No cervical adenopathy.  Skin:    General: Skin is warm.     Findings: No erythema or rash.  Neurological:     General: No focal deficit present.     Mental Status: He is alert and oriented to person, place, and time.     Cranial Nerves: No cranial nerve deficit.     Gait: Gait normal.     Deep Tendon Reflexes:     Reflex Scores:      Patellar reflexes are 2+ on the right side and 2+ on the left side. Psychiatric:        Mood and Affect: Mood and affect normal.    ASSESSMENT AND PLAN:  [name] was seen today for ED follow-up.  Diagnoses and all orders for this visit:  Benign paroxysmal positional vertigo, unspecified laterality Seen in the ED yesterday for severe dizziness. Hx suggest positional vertigo. We discussed Dx,prognosis,and treatment options. Problem has improved with Meclizine . Fall precautions to continue. PT referral for vestibular therapy was placed yesterday, appt is pending. Instructed about warning signs.  Return if symptoms worsen or fail to improve, for keep next appointment.  Panayiota Larkin G. Charisa Twitty, MD  [facility]. [place] office.

## 2024-03-03 ENCOUNTER — Telehealth: Payer: Self-pay | Admitting: Family Medicine

## 2024-03-03 NOTE — Telephone Encounter (Signed)
 Copied from CRM #[id number]. Topic: Referral - Status >> Mar 03, 2024  4:16 PM Drema MATSU wrote: Reason for CRM: Patient stated that [facility] location has not received referral. Patient stated that he needs this expedited so he can get in for his vertigo.

## 2024-03-05 NOTE — Telephone Encounter (Signed)
 Richard Middleton   03/04/2024  2:24 PM  Patient called to follow up on this. Says this is his 3rd phone call and would like a call back today with an update. Looks like [facility] is waiting for a diagnosis.    Previous Version

## 2024-03-05 NOTE — Telephone Encounter (Signed)
 Richard Middleton   03/05/2024  8:50 AM  Patient is calling to follow up on the referral for physical therapy, he has not gotten any updates or calls to schedule. Referral needs to have a diagnosis. Patient is becoming upset that no one is giving him any updates.

## 2024-03-06 ENCOUNTER — Other Ambulatory Visit: Payer: Self-pay | Admitting: Family Medicine

## 2024-03-06 DIAGNOSIS — I700 Atherosclerosis of aorta: Secondary | ICD-10-CM

## 2024-03-08 NOTE — Telephone Encounter (Signed)
 Reason for CRM: Patient is calling because his referral for PT needs diagnosis codes >> physical therapy related diagnosis with ICD 10 code. Can someone update his referral so patient can call and get scheduled? He is very upset.

## 2024-03-09 NOTE — ED Provider Notes (Signed)
 Emergency Department Provider Note  TRIAGE NOTE: Patient arrives via [facility] from home for dizziness and near syncope. Patient reports feeling dizzy and slid off the toilet. Denies LOC. Dizziness worsens with movement and is associated with nausea. No head trauma and denies blood thinners.   500 ml NS given PTA.   HISTORY  Chief Complaint Near Syncope   HPI Richard Middleton is a 78 y.o. male with  dizziness, lightheadedness, and weakness. The symptoms began when he got up in the middle of the night to use the restroom and nearly slid off the toilet due to weakness. He describes the dizziness as a spinning sensation accompanied by nausea and lightheadedness, resembling vertigo. The patient denies recent illness, runny nose, cough, sinus issues, ear pain, headache, or chest pain, except for a slight headache. He has a history of being told he has an irregular heartbeat, attributed to premature beats, and was recently noted to be prediabetic during a wellness visit. He is currently on a low-dose statin and vitamin D. The patient has experienced similar vertigo symptoms in the past, which resolved after several visits but does not recall specific treatments or imaging. He denies any recent ear, nose, and throat evaluations or imaging studies. The history was obtained from the patient.  PMH Past Medical History:  Diagnosis Date   Abnormal glucose    History of small bowel obstruction    x2   Pneumonia    as a child    Home Medications Prior to Admission medications   Medication Sig Start Date End Date Taking? Authorizing Provider  meclizine  (ANTIVERT ) 25 MG tablet Take 1 tablet (25 mg total) by mouth 3 (three) times daily as needed for dizziness. 02/29/24  Yes Dean Clarity, MD  PREVIDENT 5000 BOOSTER PLUS 1.1 % PSTE 3 (three) times daily. 06/09/23  Yes [provider]  VITAMIN D PO Take 1 tablet by mouth daily.   Yes [provider]  lovastatin  (MEVACOR ) 10 MG tablet TAKE  1 TABLET BY MOUTH AT BEDTIME 03/08/24   Jordan, Betty G, MD    Social History Social History   Tobacco Use   Smoking status: Former    Current packs/day: 0.00    Average packs/day: 4.0 packs/day for 4.0 years (16.0 ttl pk-yrs)    Types: Cigarettes    Start date: 04/15/1966    Quit date: 04/15/1970    Years since quitting: 53.9   Smokeless tobacco: Never  Vaping Use   Vaping status: Never Used  Substance Use Topics   Alcohol use: Yes    Alcohol/week: 1.0 standard drink of alcohol    Types: 1 Glasses of wine per week    Comment: 2-3 times per month    Drug use: No    Review of Systems: Documented in HPI ____________________________________________  PHYSICAL EXAM: VITAL SIGNS: Triage: Blood pressure (!) 140/91, pulse (!) 52, temperature 97.8 F (36.6 C), temperature source Oral, resp. rate 18, height 6' 1 (1.854 m), weight 77.1 kg, SpO2 100%.  Vitals:   02/29/24 9062 02/29/24 1045 02/29/24 1130 02/29/24 1152  BP:  119/79 (!) 140/91   Pulse:  (!) 56 (!) 52   Resp:  13 18   Temp: (!) 97.5 F (36.4 C)   97.8 F (36.6 C)  TempSrc: Oral   Oral  SpO2:  100% 100%   Weight:      Height:        Physical Exam Vitals and nursing note reviewed.  Constitutional:  Appearance: He is well-developed.  HENT:     Head: Normocephalic and atraumatic.  Cardiovascular:     Rate and Rhythm: Normal rate.  Pulmonary:     Effort: Pulmonary effort is normal. No respiratory distress.  Abdominal:     General: There is no distension.  Musculoskeletal:        General: Normal range of motion.     Cervical back: Normal range of motion.  Neurological:     Mental Status: He is alert.     Comments: No altered mental status, able to give full seemingly accurate history.  Face is symmetric, EOM's intact, pupils equal and reactive, vision intact, tongue and uvula midline without deviation. Upper and Lower extremity motor 5/5, intact pain perception in distal extremities, 2+ reflexes in biceps,  patella and achilles tendons.         ____________________________________________   LABS (all labs ordered are listed, but only abnormal results are displayed)  Labs Reviewed  CBC WITH DIFFERENTIAL/PLATELET - Abnormal; Notable for the following components:      Result Value   Hemoglobin 12.9 (*)    All other components within normal limits  COMPREHENSIVE METABOLIC PANEL WITH GFR - Abnormal; Notable for the following components:   Glucose, Bld 109 (*)    Total Protein 6.3 (*)    Albumin 3.3 (*)    All other components within normal limits  TROPONIN I (HIGH SENSITIVITY)   ____________________________________________  EKG   EKG Interpretation Date/Time:  Sunday February 29 2024 06:53:59 EST Ventricular Rate:  61 PR Interval:  175 QRS Duration:  98 QT Interval:  415 QTC Calculation: 418 R Axis:   71  Text Interpretation: Sinus arrhythmia Minimal ST elevation, inferior leads PACs new Confirmed by Dean Clarity [phone]) on 02/29/2024 9:29:05 AM        ____________________________________________  RADIOLOGY  No results found. ____________________________________________  PROCEDURES  Procedure(s) performed:   Procedures ____________________________________________  INITIAL IMPRESSION / ASSESSMENT AND PLAN       Images ordered viewed and obtained by myself. Agree with Radiology interpretation. Details in ED course.  Labs ordered reviewed by myself as detailed in ED course.  Consultations obtained/considered detailed in ED course.    CRITICAL INTERVENTIONS:  N/a  Patient with a vertiginous episode.  Has been told in past he had an irregular heartbeat but appears to be PACs on my interpretation of the monitor.  Secondary to his age and medical history CTA was ordered.  Meclizine  and fluids seem to help with symptoms.  Low suspicion for central cause at this point but still need to CTA to rule out posterior circulation issues.   FINAL IMPRESSION Final  diagnoses:  Vertigo     Disposition  Care transferred pending completion of workup, reevaluation and disposition.   Note:  This note was prepared with assistance of Dragon voice recognition software. Occasional wrong-word or sound-a-like substitutions may have occurred due to the inherent limitations of voice recognition software.    Lorette Mayo, MD 03/09/24 [phone]

## 2024-03-15 ENCOUNTER — Ambulatory Visit: Payer: Self-pay

## 2024-03-15 DIAGNOSIS — H8110 Benign paroxysmal vertigo, unspecified ear: Secondary | ICD-10-CM

## 2024-03-15 NOTE — Telephone Encounter (Signed)
 I did place a referral for vestibular therapy during his last OV. A new referral can be placed as requested. Thanks, BJ

## 2024-03-15 NOTE — Telephone Encounter (Signed)
 FYI Only or Action Required?: Action required by provider: referral request. Pt needing updated referral to vestibular rehab that includes ICD 10 code sent to vestibular rehab. [facility]) at [address], [address], [place], [place] [address]. Pt requesting call back when it has been sent.  Patient was last seen in primary care on 03/01/2024 by Jordan, Betty G, MD.  Called Nurse Triage reporting Advice Only.  Symptoms began about a month ago.  Interventions attempted: Other: No triage.  Symptoms are: stable.  Triage Disposition: Call PCP Now  Patient/caregiver understands and will follow disposition?: Yes  Copied from CRM #[id number]. Topic: Clinical - Red Word Triage >> Mar 15, 2024 11:23 AM Adelita E wrote: [facility] that prompted transfer to Nurse Triage: Vertigo, patient was diagnosed on 11/16, awaiting physical therapy referral. Patient is still having vertigo and has not been contacted by PT. Reason for Disposition  [1] Caller requests to speak ONLY to PCP AND [2] URGENT question    Pt willing to talk with nurse triage. Needing to have vestibular rehab referral WITH ICD code on it sent to [facility]) at [address], [place], [place] [address] for vertigo symptoms. Has called multiple times and reports the rehab center still hasn't received updated referral.  Answer Assessment - Initial Assessment Questions 1. REASON FOR CALL or QUESTION: What is your reason for calling today? or How can I best      Pt went to ED on 11/16 with dizziness. Was dx with vertigo and discharged. Had f/u visit with PCP on 11/17, plan to have referral made to vestibular rehab. Pt reports the rehab center needs an updated referral that has the ICD 10 dx code on it. Pt has called office multiple times since then to get referral updated and sent to [facility]) at [address], [address] [place], [place], [place] [address] for vertigo symptoms. Reports calling the rehab center which still hasn't received updated referral. Reports persistent moderate vertigo, denies change in symptoms since last visit with PCP. Denies other symptoms, has not fallen.  2. CALLER: Document the source of call. (e.g., laboratory staff, caregiver or patient).      Patient  Protocols used: PCP Call - No Triage-A-AH

## 2024-03-23 ENCOUNTER — Telehealth: Payer: Self-pay | Admitting: Family Medicine

## 2024-03-23 ENCOUNTER — Telehealth: Payer: Self-pay

## 2024-03-23 DIAGNOSIS — R42 Dizziness and giddiness: Secondary | ICD-10-CM

## 2024-03-23 NOTE — Telephone Encounter (Signed)
 Please sent referral to outpt neuro rehab for vestibular therapy for vertigo phone number [phone] and fax number [phone]

## 2024-03-23 NOTE — Telephone Encounter (Signed)
 PT referral has been placed by Mliss Boyers, MD.   Vestibular Therapy referral was placed to [facility] on 03/23/24   Copied from CRM #[id number]. Topic: Referral - Status >> Mar 23, 2024  9:58 AM Charolett L wrote: Reason for CRM: Laquita from outpatient neuro rehab is call for an expedited referral for PT. Patient was still waiting on referral CB# [phone] FAX# [phone]

## 2024-03-31 ENCOUNTER — Encounter: Payer: Self-pay | Admitting: Physical Therapy

## 2024-03-31 ENCOUNTER — Ambulatory Visit: Admitting: Physical Therapy

## 2024-03-31 DIAGNOSIS — H8112 Benign paroxysmal vertigo, left ear: Secondary | ICD-10-CM | POA: Insufficient documentation

## 2024-03-31 NOTE — Patient Instructions (Signed)
Sit to Side-Lying    Sit on edge of bed. 1. Turn head 45 to right. 2. Maintain head position and lie down slowly on left side. Hold until symptoms subside. 3. Sit up slowly. Hold until symptoms subside. 4. Turn head 45 to left. 5. Maintain head position and lie down slowly on right side. Hold until symptoms subside. 6. Sit up slowly. Repeat sequence __5__ times per session. Do __2-3__ sessions per day.  Copyright  VHI. All rights reserved.   

## 2024-03-31 NOTE — Therapy (Signed)
 OUTPATIENT PHYSICAL THERAPY VESTIBULAR EVALUATION     Patient Name: Richard Middleton MRN: [id number] DOB:[birth year], 78 y.o., male Today's Date: 03/31/2024  END OF SESSION:  PT End of Session - 03/31/24 2008     Visit Number 1    Number of Visits 4    Date for Recertification  04/30/24    Authorization Type [employer] Medicare    PT Start Time 0900   pt arrived 15 late for appt   PT Stop Time 0935    PT Time Calculation (min) 35 min    Activity Tolerance Patient tolerated treatment well    Behavior During Therapy WFL for tasks assessed/performed          Past Medical History:  Diagnosis Date   Abnormal glucose    History of small bowel obstruction    x2   Pneumonia    as a child   Past Surgical History:  Procedure Laterality Date   ACHILLES TENDON SURGERY Right 05/02/2017   Procedure: RIGHT ACHILLES RECONSTRUCTION;  Surgeon: Harden Jerona GAILS, MD;  Location: [facility] OR;  Service: Orthopedics;  Laterality: Right;   COLONOSCOPY  2008   LAPAROSCOPIC SMALL BOWEL RESECTION  3 1/2 years ago, & 10 years ago   x2   LAPAROTOMY N/A 07/13/2014   Procedure: EXPLORATORY LAPAROTOMY lysis of adhesions;  Surgeon: Bernarda Ned, MD;  Location: [facility] ORS;  Service: General;  Laterality: N/A;   LUMBAR LAMINECTOMY/DECOMPRESSION MICRODISCECTOMY N/A 05/18/2018   Procedure: L4-5 DECOMPRESSION, RIGHT MICRODISCECTOMY;  Surgeon: Barbarann Oneil BROCKS, MD;  Location: [facility] OR;  Service: Orthopedics;  Laterality: N/A;   Patient Active Problem List   Diagnosis Date Noted   Chronic right-sided low back pain without sciatica 12/29/2023   Hx of adenomatous colonic polyps 10/02/2023   Atherosclerosis of aorta 12/03/2019   Prediabetes 12/03/2019   Partial small bowel obstruction (HCC) 10/05/2018   Status post lumbar spine operative procedure for decompression of spinal cord 06/26/2018   Foot drop, right 05/05/2018   ED (erectile dysfunction) 08/27/2017   BPH associated with nocturia 08/27/2017   Achilles tendon tear, right,  subsequent encounter 05/14/2017   Seborrheic keratoses 08/21/2016   Small bowel obstruction (HCC) 07/11/2014   Tendinitis of forearm 04/19/2013   Routine general medical examination at a health care facility 01/24/2012   Lipoma 06/29/2010   TINEA BARBAE 01/22/2010   SMALL BOWEL OBSTRUCTION, HX OF 02/19/2007    PCP: Jordan, Betty G, MD REFERRING PROVIDER: Jordan, Betty G, MD  REFERRING DIAG:  Diagnosis  R42 (ICD-10-CM) - Vertigo    THERAPY DIAG:  BPPV (benign paroxysmal positional vertigo), left  ONSET DATE: 02-29-24  Rationale for Evaluation and Treatment: Rehabilitation  SUBJECTIVE:   SUBJECTIVE STATEMENT: Pt reports dizziness started on 02-29-24 - was transported to [facility] ED by EMS;  pt reports dizziness has improved in past couple of weeks  Pt accompanied by: self  PERTINENT HISTORY: Richard Middleton is a 78 y.o. male with  dizziness, lightheadedness, and weakness. The symptoms began when he got up in the middle of the night to use the restroom and nearly slid off the toilet due to weakness. He describes the dizziness as a spinning sensation accompanied by nausea and lightheadedness, resembling vertigo. The patient denies recent illness, runny nose, cough, sinus issues, ear pain, headache, or chest pain, except for a slight headache. He has a history of being told he has an irregular heartbeat, attributed to premature beats, and was recently noted to be prediabetic during a wellness visit. He is  currently on a low-dose statin and vitamin D. The patient has experienced similar vertigo symptoms in the past, which resolved after several visits but does not recall specific treatments or imaging. He denies any recent ear, nose, and throat evaluations or imaging studies. The history was obtained from the patient.   PAIN:  Are you having pain? No  PRECAUTIONS: None  RED FLAGS: None   WEIGHT BEARING RESTRICTIONS: No  FALLS: Has patient fallen in last 6 months? No  LIVING  ENVIRONMENT: Lives with: lives with their family  PLOF: Independent  PATIENT GOALS: resolve the vertigo, if still present  OBJECTIVE:  Note: Objective measures were completed at Evaluation unless otherwise noted.  DIAGNOSTIC FINDINGS: Head and neck CTA: 1. No large vessel occlusion, hemodynamically significant stenosis, or aneurysm  in the head or neck  2. Age-related atrophy and mild cerebral white matter disease    COGNITION: Overall cognitive status: Within functional limits for tasks assessed   GAIT: Gait pattern: [facility] Distance walked: 30' Assistive device utilized: None Level of assistance: Complete Independence Comments: no unsteadiness noted  VESTIBULAR ASSESSMENT:  GENERAL OBSERVATION: Pt had sudden onset vertigo on 02-29-24 - nearly slid off toilet due to dizziness; was transported to [facility] ED by EMS; (there for 6 hours) and diagnosed with vertigo - was prescribed Meclizine  but states he is not taking this medication at this time   SYMPTOM BEHAVIOR:  Subjective history: see above   Non-Vestibular symptoms: N/A  Type of dizziness: Spinning/Vertigo and Lightheadedness/Faint  Frequency: usually daily - varies depending on movement  Duration: secs to minutes  Aggravating factors: Induced by position change: rolling to the right, rolling to the left, and supine to sit  Relieving factors: head stationary  Progression of symptoms: better  POSITIONAL TESTING: Right Dix-Hallpike: no nystagmus Left Dix-Hallpike: no nystagmus Pt reported > sensation of dizziness in Lt Dix-Hallpike test position than in Rt  MOTION SENSITIVITY:  Motion Sensitivity Quotient Intensity: 0 = none, 1 = Lightheaded, 2 = Mild, 3 = Moderate, 4 = Severe, 5 = Vomiting  Intensity  1. Sitting to supine   2. Supine to L side   3. Supine to R side   4. Supine to sitting   5. L Hallpike-Dix   6. Up from L    7. R Hallpike-Dix   8. Up from R    9. Sitting, head tipped to L knee   10. Head up from L  knee   11. Sitting, head tipped to R knee   12. Head up from R knee   13. Sitting head turns x5   14.Sitting head nods x5   15. In stance, 180 turn to L    16. In stance, 180 turn to R                                                                                                                                TREATMENT DATE: 03-31-24  Canalith Repositioning:  Epley Left: Number of Reps: 1, Response to Treatment: comment: no nystagmus noted and no c/o spinning vertigo, and Comment: pt's symptoms appear to be consistent with BPPV which has resolved as of this time  PATIENT EDUCATION: Education details: etiology of BPPV - article from [facility] given to pt; also habituation exercise sit to Lt sidelying Person educated: Patient Education method: Explanation, Demonstration, and Handouts Education comprehension: verbalized understanding and returned demonstration  HOME EXERCISE PROGRAM:  habituation sit to Lt sidelying  GOALS: Goals reviewed with patient? Yes  SHORT TERM GOALS: same as LTG's   LONG TERM GOALS: Target date: 04-30-24  Pt will report no vertigo with any bed mobility. Baseline:  Goal status: INITIAL  2.  Independent in Epley/Brandt-Daroff exercise for self treatment prn. Baseline:  Goal status: INITIAL  ASSESSMENT:  CLINICAL IMPRESSION: Patient is a 78 y.o. gentleman who was seen today for physical therapy evaluation and treatment for dizziness.  Pt's symptoms are consistent with BPPV that has mostly resolved at this time.  No nystagmus was noted with any positional testing but pt did report slightly more dizziness in Lt Dix-Hallpike test position than in Rt Dix-Hallpike test position.  Pt was treated with 1 rep Epley maneuver for Lt BPPV and no vertigo was provoked.  Pt will benefit from PT to treat BPPV should pt have a recurrent episode.    OBJECTIVE IMPAIRMENTS: dizziness.   ACTIVITY LIMITATIONS: transfers and bed mobility  PARTICIPATION LIMITATIONS:  N/A  PERSONAL FACTORS: N/A are also affecting patient's functional outcome.   REHAB POTENTIAL: Excellent  CLINICAL DECISION MAKING: Stable/uncomplicated  EVALUATION COMPLEXITY: Low   PLAN:  PT FREQUENCY: 1x/week  PT DURATION: 4 weeks  PLANNED INTERVENTIONS: 97110-Therapeutic exercises, 97530- Therapeutic activity, 97112- Neuromuscular re-education, 97535- Self Care, 04007- Canalith repositioning, and Patient/Family education  PLAN FOR NEXT SESSION: assess BPPV prn - pt to call for appt should BPPV re-occur   Chelli Yerkes, [place], PT 03/31/2024, 8:10 PM

## 2024-05-19 ENCOUNTER — Ambulatory Visit: Payer: Medicare Other | Admitting: Family Medicine
